# Patient Record
Sex: Female | Born: 1935 | Race: White | Hispanic: No | Smoking: Never smoker
Health system: Southern US, Community
[De-identification: ages and names within clinical notes are randomized; demographics above are authoritative.]

## PROBLEM LIST (undated history)

## (undated) DIAGNOSIS — R011 Cardiac murmur, unspecified: Secondary | ICD-10-CM

## (undated) DIAGNOSIS — I499 Cardiac arrhythmia, unspecified: Secondary | ICD-10-CM

## (undated) DIAGNOSIS — F329 Major depressive disorder, single episode, unspecified: Secondary | ICD-10-CM

## (undated) DIAGNOSIS — Z950 Presence of cardiac pacemaker: Secondary | ICD-10-CM

## (undated) DIAGNOSIS — M1990 Unspecified osteoarthritis, unspecified site: Secondary | ICD-10-CM

## (undated) DIAGNOSIS — D649 Anemia, unspecified: Secondary | ICD-10-CM

## (undated) DIAGNOSIS — Z923 Personal history of irradiation: Secondary | ICD-10-CM

## (undated) DIAGNOSIS — K219 Gastro-esophageal reflux disease without esophagitis: Secondary | ICD-10-CM

## (undated) DIAGNOSIS — C50919 Malignant neoplasm of unspecified site of unspecified female breast: Secondary | ICD-10-CM

## (undated) DIAGNOSIS — C801 Malignant (primary) neoplasm, unspecified: Secondary | ICD-10-CM

## (undated) DIAGNOSIS — F419 Anxiety disorder, unspecified: Secondary | ICD-10-CM

## (undated) DIAGNOSIS — F32A Depression, unspecified: Secondary | ICD-10-CM

## (undated) DIAGNOSIS — I10 Essential (primary) hypertension: Secondary | ICD-10-CM

## (undated) HISTORY — PX: PACEMAKER INSERTION: SHX728

## (undated) HISTORY — PX: BREAST SURGERY: SHX581

## (undated) HISTORY — PX: EYE SURGERY: SHX253

---

## 2005-03-04 ENCOUNTER — Ambulatory Visit: Payer: Self-pay | Admitting: Internal Medicine

## 2006-04-16 ENCOUNTER — Ambulatory Visit: Payer: Self-pay | Admitting: Internal Medicine

## 2007-04-21 ENCOUNTER — Ambulatory Visit: Payer: Self-pay | Admitting: Unknown Physician Specialty

## 2007-09-05 ENCOUNTER — Emergency Department: Payer: Self-pay | Admitting: Emergency Medicine

## 2008-04-21 ENCOUNTER — Ambulatory Visit: Payer: Self-pay | Admitting: Unknown Physician Specialty

## 2008-07-13 ENCOUNTER — Ambulatory Visit: Payer: Self-pay | Admitting: Internal Medicine

## 2008-07-13 ENCOUNTER — Ambulatory Visit: Payer: Self-pay | Admitting: Ophthalmology

## 2008-07-25 ENCOUNTER — Ambulatory Visit: Payer: Self-pay | Admitting: Ophthalmology

## 2009-01-18 ENCOUNTER — Ambulatory Visit: Payer: Self-pay | Admitting: Internal Medicine

## 2009-02-08 ENCOUNTER — Ambulatory Visit: Payer: Self-pay | Admitting: Unknown Physician Specialty

## 2009-05-03 ENCOUNTER — Ambulatory Visit: Payer: Self-pay | Admitting: Internal Medicine

## 2009-05-04 ENCOUNTER — Ambulatory Visit: Payer: Self-pay | Admitting: Oncology

## 2009-06-02 ENCOUNTER — Ambulatory Visit: Payer: Self-pay | Admitting: Oncology

## 2009-06-02 ENCOUNTER — Ambulatory Visit: Payer: Self-pay | Admitting: Internal Medicine

## 2009-07-03 ENCOUNTER — Ambulatory Visit: Payer: Self-pay | Admitting: Internal Medicine

## 2009-07-03 ENCOUNTER — Ambulatory Visit: Payer: Self-pay | Admitting: Oncology

## 2009-07-19 ENCOUNTER — Ambulatory Visit: Payer: Self-pay | Admitting: Internal Medicine

## 2009-08-10 ENCOUNTER — Ambulatory Visit: Payer: Self-pay | Admitting: Rheumatology

## 2009-09-02 DIAGNOSIS — C801 Malignant (primary) neoplasm, unspecified: Secondary | ICD-10-CM

## 2009-09-02 DIAGNOSIS — C50919 Malignant neoplasm of unspecified site of unspecified female breast: Secondary | ICD-10-CM

## 2009-09-02 HISTORY — DX: Malignant (primary) neoplasm, unspecified: C80.1

## 2009-09-02 HISTORY — PX: BREAST BIOPSY: SHX20

## 2009-09-02 HISTORY — DX: Malignant neoplasm of unspecified site of unspecified female breast: C50.919

## 2009-09-05 ENCOUNTER — Ambulatory Visit: Payer: Self-pay | Admitting: Pain Medicine

## 2010-01-17 ENCOUNTER — Ambulatory Visit: Payer: Self-pay | Admitting: Internal Medicine

## 2010-02-27 ENCOUNTER — Ambulatory Visit: Payer: Self-pay | Admitting: Surgery

## 2010-03-02 ENCOUNTER — Ambulatory Visit: Payer: Self-pay | Admitting: Oncology

## 2010-03-06 ENCOUNTER — Ambulatory Visit: Payer: Self-pay | Admitting: Surgery

## 2010-03-19 ENCOUNTER — Ambulatory Visit: Payer: Self-pay | Admitting: Oncology

## 2010-03-21 ENCOUNTER — Ambulatory Visit: Payer: Self-pay

## 2010-04-02 ENCOUNTER — Ambulatory Visit: Payer: Self-pay | Admitting: Oncology

## 2010-05-03 ENCOUNTER — Ambulatory Visit: Payer: Self-pay | Admitting: Oncology

## 2010-05-29 ENCOUNTER — Ambulatory Visit: Payer: Self-pay

## 2010-06-02 ENCOUNTER — Ambulatory Visit: Payer: Self-pay | Admitting: Oncology

## 2010-06-05 ENCOUNTER — Ambulatory Visit: Payer: Self-pay | Admitting: Oncology

## 2010-06-11 ENCOUNTER — Emergency Department: Payer: Self-pay | Admitting: Emergency Medicine

## 2010-07-03 ENCOUNTER — Ambulatory Visit: Payer: Self-pay | Admitting: Oncology

## 2010-07-23 ENCOUNTER — Ambulatory Visit: Payer: Self-pay | Admitting: Internal Medicine

## 2010-08-10 ENCOUNTER — Ambulatory Visit: Payer: Self-pay | Admitting: Oncology

## 2010-09-02 ENCOUNTER — Ambulatory Visit: Payer: Self-pay | Admitting: Oncology

## 2010-09-25 ENCOUNTER — Ambulatory Visit: Payer: Self-pay | Admitting: Physician Assistant

## 2010-09-30 ENCOUNTER — Emergency Department: Payer: Self-pay | Admitting: Emergency Medicine

## 2010-10-03 ENCOUNTER — Ambulatory Visit: Payer: Self-pay | Admitting: Oncology

## 2010-10-19 ENCOUNTER — Ambulatory Visit: Payer: Self-pay | Admitting: Unknown Physician Specialty

## 2010-11-01 ENCOUNTER — Ambulatory Visit: Payer: Self-pay | Admitting: Oncology

## 2010-12-26 ENCOUNTER — Ambulatory Visit: Payer: Self-pay | Admitting: Unknown Physician Specialty

## 2011-01-09 ENCOUNTER — Ambulatory Visit: Payer: Self-pay | Admitting: Oncology

## 2011-02-01 ENCOUNTER — Ambulatory Visit: Payer: Self-pay | Admitting: Oncology

## 2011-03-03 ENCOUNTER — Ambulatory Visit: Payer: Self-pay | Admitting: Oncology

## 2011-04-03 ENCOUNTER — Ambulatory Visit: Payer: Self-pay | Admitting: Oncology

## 2011-05-04 ENCOUNTER — Ambulatory Visit: Payer: Self-pay | Admitting: Oncology

## 2011-05-15 ENCOUNTER — Ambulatory Visit: Payer: Self-pay | Admitting: Obstetrics and Gynecology

## 2011-06-03 ENCOUNTER — Ambulatory Visit: Payer: Self-pay | Admitting: Oncology

## 2011-06-06 ENCOUNTER — Ambulatory Visit: Payer: Self-pay | Admitting: Obstetrics and Gynecology

## 2011-06-06 DIAGNOSIS — Z950 Presence of cardiac pacemaker: Secondary | ICD-10-CM

## 2011-06-10 ENCOUNTER — Ambulatory Visit: Payer: Self-pay | Admitting: Obstetrics and Gynecology

## 2011-07-04 ENCOUNTER — Ambulatory Visit: Payer: Self-pay | Admitting: Oncology

## 2011-07-12 ENCOUNTER — Ambulatory Visit: Payer: Self-pay | Admitting: Rheumatology

## 2011-07-30 ENCOUNTER — Ambulatory Visit: Payer: Self-pay | Admitting: Oncology

## 2011-08-29 DIAGNOSIS — I10 Essential (primary) hypertension: Secondary | ICD-10-CM | POA: Insufficient documentation

## 2011-08-29 DIAGNOSIS — Z950 Presence of cardiac pacemaker: Secondary | ICD-10-CM | POA: Insufficient documentation

## 2011-08-29 DIAGNOSIS — F329 Major depressive disorder, single episode, unspecified: Secondary | ICD-10-CM | POA: Insufficient documentation

## 2011-08-29 DIAGNOSIS — I4891 Unspecified atrial fibrillation: Secondary | ICD-10-CM | POA: Insufficient documentation

## 2011-08-29 DIAGNOSIS — G2581 Restless legs syndrome: Secondary | ICD-10-CM | POA: Insufficient documentation

## 2011-08-29 DIAGNOSIS — I442 Atrioventricular block, complete: Secondary | ICD-10-CM | POA: Insufficient documentation

## 2011-08-29 DIAGNOSIS — C50919 Malignant neoplasm of unspecified site of unspecified female breast: Secondary | ICD-10-CM | POA: Insufficient documentation

## 2011-08-29 DIAGNOSIS — E785 Hyperlipidemia, unspecified: Secondary | ICD-10-CM | POA: Insufficient documentation

## 2011-09-04 DIAGNOSIS — I472 Ventricular tachycardia: Secondary | ICD-10-CM | POA: Insufficient documentation

## 2011-09-25 ENCOUNTER — Ambulatory Visit: Payer: Self-pay | Admitting: Oncology

## 2011-09-25 LAB — CBC CANCER CENTER
Basophil #: 0 x10 3/mm (ref 0.0–0.1)
Basophil %: 0.1 %
Eosinophil #: 0 x10 3/mm (ref 0.0–0.7)
Eosinophil %: 0.7 %
HCT: 34.9 % — ABNORMAL LOW (ref 35.0–47.0)
Lymphocyte #: 1.3 x10 3/mm (ref 1.0–3.6)
MCH: 32 pg (ref 26.0–34.0)
MCHC: 33.6 g/dL (ref 32.0–36.0)
MCV: 95 fL (ref 80–100)
Monocyte %: 8 %
Neutrophil %: 64.1 %
Platelet: 225 x10 3/mm (ref 150–440)
RBC: 3.67 10*6/uL — ABNORMAL LOW (ref 3.80–5.20)
RDW: 15.7 % — ABNORMAL HIGH (ref 11.5–14.5)

## 2011-09-25 LAB — COMPREHENSIVE METABOLIC PANEL
Albumin: 4.2 g/dL (ref 3.4–5.0)
Alkaline Phosphatase: 75 U/L (ref 50–136)
Bilirubin,Total: 0.6 mg/dL (ref 0.2–1.0)
Creatinine: 1.6 mg/dL — ABNORMAL HIGH (ref 0.60–1.30)
EGFR (African American): 40 — ABNORMAL LOW
Glucose: 121 mg/dL — ABNORMAL HIGH (ref 65–99)
SGOT(AST): 27 U/L (ref 15–37)
Sodium: 142 mmol/L (ref 136–145)
Total Protein: 7.9 g/dL (ref 6.4–8.2)

## 2011-09-25 LAB — FOLATE: Folic Acid: 71.3 ng/mL (ref 3.1–100.0)

## 2011-10-04 ENCOUNTER — Ambulatory Visit: Payer: Self-pay | Admitting: Oncology

## 2011-11-11 ENCOUNTER — Ambulatory Visit: Payer: Self-pay | Admitting: Oncology

## 2011-12-02 ENCOUNTER — Ambulatory Visit: Payer: Self-pay | Admitting: Oncology

## 2011-12-02 ENCOUNTER — Ambulatory Visit: Payer: Self-pay | Admitting: Unknown Physician Specialty

## 2011-12-04 LAB — PATHOLOGY REPORT

## 2012-01-09 ENCOUNTER — Ambulatory Visit: Payer: Self-pay | Admitting: Oncology

## 2012-02-01 ENCOUNTER — Ambulatory Visit: Payer: Self-pay | Admitting: Oncology

## 2012-02-03 LAB — CBC CANCER CENTER
Basophil #: 0 x10 3/mm (ref 0.0–0.1)
Basophil %: 0.5 %
HCT: 31.2 % — ABNORMAL LOW (ref 35.0–47.0)
Lymphocyte %: 36.9 %
MCH: 32.4 pg (ref 26.0–34.0)
MCV: 99 fL (ref 80–100)
Monocyte #: 0.4 x10 3/mm (ref 0.2–0.9)
Monocyte %: 10.1 %
Neutrophil #: 2 x10 3/mm (ref 1.4–6.5)
Platelet: 183 x10 3/mm (ref 150–440)
RBC: 3.16 10*6/uL — ABNORMAL LOW (ref 3.80–5.20)
RDW: 14.6 % — ABNORMAL HIGH (ref 11.5–14.5)

## 2012-02-03 LAB — COMPREHENSIVE METABOLIC PANEL
Alkaline Phosphatase: 52 U/L (ref 50–136)
Anion Gap: 7 (ref 7–16)
Calcium, Total: 8.8 mg/dL (ref 8.5–10.1)
Co2: 30 mmol/L (ref 21–32)
Creatinine: 1.54 mg/dL — ABNORMAL HIGH (ref 0.60–1.30)
Glucose: 92 mg/dL (ref 65–99)
Osmolality: 282 (ref 275–301)
SGPT (ALT): 19 U/L
Sodium: 138 mmol/L (ref 136–145)

## 2012-03-02 ENCOUNTER — Ambulatory Visit: Payer: Self-pay | Admitting: Oncology

## 2012-08-03 ENCOUNTER — Ambulatory Visit: Payer: Self-pay | Admitting: Internal Medicine

## 2012-11-07 ENCOUNTER — Emergency Department: Payer: Self-pay | Admitting: Emergency Medicine

## 2012-11-07 LAB — CBC
HCT: 34.2 % — ABNORMAL LOW (ref 35.0–47.0)
MCH: 32.5 pg (ref 26.0–34.0)
MCHC: 33.2 g/dL (ref 32.0–36.0)
MCV: 98 fL (ref 80–100)
Platelet: 205 10*3/uL (ref 150–440)

## 2012-11-07 LAB — BASIC METABOLIC PANEL
Anion Gap: 6 — ABNORMAL LOW (ref 7–16)
BUN: 27 mg/dL — ABNORMAL HIGH (ref 7–18)
Calcium, Total: 9.2 mg/dL (ref 8.5–10.1)
Chloride: 104 mmol/L (ref 98–107)
Co2: 31 mmol/L (ref 21–32)
Creatinine: 1.39 mg/dL — ABNORMAL HIGH (ref 0.60–1.30)
EGFR (African American): 43 — ABNORMAL LOW
EGFR (Non-African Amer.): 37 — ABNORMAL LOW
Potassium: 3.8 mmol/L (ref 3.5–5.1)

## 2012-11-07 LAB — TROPONIN I: Troponin-I: <0.02 ng/mL

## 2012-12-01 ENCOUNTER — Ambulatory Visit: Payer: Self-pay | Admitting: Oncology

## 2012-12-31 ENCOUNTER — Ambulatory Visit: Payer: Self-pay | Admitting: Oncology

## 2013-01-26 ENCOUNTER — Ambulatory Visit: Payer: Self-pay | Admitting: Orthopedic Surgery

## 2013-01-26 LAB — HEMOGLOBIN: HGB: 10.4 g/dL — ABNORMAL LOW (ref 12.0–16.0)

## 2013-02-01 ENCOUNTER — Ambulatory Visit: Payer: Self-pay | Admitting: Oncology

## 2013-02-01 LAB — CBC CANCER CENTER
Basophil #: 0 x10 3/mm (ref 0.0–0.1)
Eosinophil #: 0 x10 3/mm (ref 0.0–0.7)
Eosinophil %: 0.7 %
HCT: 32.3 % — ABNORMAL LOW (ref 35.0–47.0)
HGB: 10.9 g/dL — ABNORMAL LOW (ref 12.0–16.0)
Lymphocyte %: 37.5 %
MCH: 32.4 pg (ref 26.0–34.0)
MCHC: 33.7 g/dL (ref 32.0–36.0)
MCV: 96 fL (ref 80–100)
Monocyte #: 0.4 x10 3/mm (ref 0.2–0.9)
Monocyte %: 8.8 %
Platelet: 173 x10 3/mm (ref 150–440)
RBC: 3.36 10*6/uL — ABNORMAL LOW (ref 3.80–5.20)
RDW: 14.1 % (ref 11.5–14.5)

## 2013-02-01 LAB — COMPREHENSIVE METABOLIC PANEL
Albumin: 4 g/dL (ref 3.4–5.0)
Anion Gap: 6 — ABNORMAL LOW (ref 7–16)
BUN: 33 mg/dL — ABNORMAL HIGH (ref 7–18)
Bilirubin,Total: 0.6 mg/dL (ref 0.2–1.0)
Co2: 32 mmol/L (ref 21–32)
Creatinine: 1.68 mg/dL — ABNORMAL HIGH (ref 0.60–1.30)
Glucose: 123 mg/dL — ABNORMAL HIGH (ref 65–99)
Osmolality: 288 (ref 275–301)
Potassium: 3.7 mmol/L (ref 3.5–5.1)
SGOT(AST): 22 U/L (ref 15–37)
SGPT (ALT): 18 U/L (ref 12–78)
Sodium: 140 mmol/L (ref 136–145)

## 2013-02-02 ENCOUNTER — Ambulatory Visit: Payer: Self-pay | Admitting: Orthopedic Surgery

## 2013-02-02 LAB — PROTIME-INR
INR: 1.1
Prothrombin Time: 14.1 secs (ref 11.5–14.7)

## 2013-03-02 ENCOUNTER — Ambulatory Visit: Payer: Self-pay | Admitting: Oncology

## 2013-08-04 ENCOUNTER — Ambulatory Visit: Payer: Self-pay | Admitting: Oncology

## 2013-08-09 ENCOUNTER — Ambulatory Visit: Payer: Self-pay | Admitting: Oncology

## 2013-08-09 LAB — CBC CANCER CENTER
Basophil #: 0 x10 3/mm (ref 0.0–0.1)
Basophil %: 0.5 %
Eosinophil #: 0 x10 3/mm (ref 0.0–0.7)
HGB: 11.4 g/dL — ABNORMAL LOW (ref 12.0–16.0)
Lymphocyte #: 2 x10 3/mm (ref 1.0–3.6)
Lymphocyte %: 43.2 %
MCH: 32.2 pg (ref 26.0–34.0)
MCV: 98 fL (ref 80–100)
Monocyte #: 0.4 x10 3/mm (ref 0.2–0.9)
Monocyte %: 8.1 %
Neutrophil #: 2.1 x10 3/mm (ref 1.4–6.5)
RBC: 3.55 10*6/uL — ABNORMAL LOW (ref 3.80–5.20)
RDW: 14.1 % (ref 11.5–14.5)
WBC: 4.5 x10 3/mm (ref 3.6–11.0)

## 2013-08-09 LAB — COMPREHENSIVE METABOLIC PANEL
Albumin: 4.1 g/dL (ref 3.4–5.0)
Alkaline Phosphatase: 70 U/L
Anion Gap: 7 (ref 7–16)
Bilirubin,Total: 0.2 mg/dL (ref 0.2–1.0)
Chloride: 102 mmol/L (ref 98–107)
Co2: 32 mmol/L (ref 21–32)
EGFR (African American): 38 — ABNORMAL LOW
Glucose: 91 mg/dL (ref 65–99)
Potassium: 3.6 mmol/L (ref 3.5–5.1)
SGPT (ALT): 23 U/L (ref 12–78)
Sodium: 141 mmol/L (ref 136–145)

## 2013-08-09 LAB — IRON AND TIBC
Iron Saturation: 27 %
Iron: 75 ug/dL (ref 50–170)

## 2013-09-02 ENCOUNTER — Ambulatory Visit: Payer: Self-pay | Admitting: Oncology

## 2013-10-03 ENCOUNTER — Ambulatory Visit: Payer: Self-pay | Admitting: Oncology

## 2013-10-24 ENCOUNTER — Emergency Department: Payer: Self-pay | Admitting: Emergency Medicine

## 2013-10-24 LAB — LIPASE, BLOOD: Lipase: 151 U/L (ref 73–393)

## 2013-10-24 LAB — COMPREHENSIVE METABOLIC PANEL
ANION GAP: 8 (ref 7–16)
AST: 32 U/L (ref 15–37)
Albumin: 4.4 g/dL (ref 3.4–5.0)
Alkaline Phosphatase: 61 U/L
BUN: 26 mg/dL — ABNORMAL HIGH (ref 7–18)
Bilirubin,Total: 0.7 mg/dL (ref 0.2–1.0)
CHLORIDE: 103 mmol/L (ref 98–107)
Calcium, Total: 9.6 mg/dL (ref 8.5–10.1)
Co2: 27 mmol/L (ref 21–32)
Creatinine: 1.75 mg/dL — ABNORMAL HIGH (ref 0.60–1.30)
EGFR (African American): 32 — ABNORMAL LOW
EGFR (Non-African Amer.): 28 — ABNORMAL LOW
GLUCOSE: 94 mg/dL (ref 65–99)
Osmolality: 280 (ref 275–301)
Potassium: 4 mmol/L (ref 3.5–5.1)
SGPT (ALT): 18 U/L (ref 12–78)
Sodium: 138 mmol/L (ref 136–145)
Total Protein: 7.9 g/dL (ref 6.4–8.2)

## 2013-10-24 LAB — URINALYSIS, COMPLETE
BLOOD: NEGATIVE
Bacteria: NONE SEEN
Bilirubin,UR: NEGATIVE
Glucose,UR: NEGATIVE mg/dL (ref 0–75)
Nitrite: NEGATIVE
PH: 7 (ref 4.5–8.0)
PROTEIN: NEGATIVE
RBC,UR: 1 /HPF (ref 0–5)
Specific Gravity: 1.016 (ref 1.003–1.030)
Squamous Epithelial: 1

## 2013-10-24 LAB — CBC WITH DIFFERENTIAL/PLATELET
BASOS ABS: 0.1 10*3/uL (ref 0.0–0.1)
BASOS PCT: 1.4 %
EOS ABS: 0 10*3/uL (ref 0.0–0.7)
EOS PCT: 0.6 %
HCT: 35.9 % (ref 35.0–47.0)
HGB: 12.2 g/dL (ref 12.0–16.0)
LYMPHS PCT: 32.7 %
Lymphocyte #: 1.5 10*3/uL (ref 1.0–3.6)
MCH: 33.2 pg (ref 26.0–34.0)
MCHC: 34.1 g/dL (ref 32.0–36.0)
MCV: 97 fL (ref 80–100)
Monocyte #: 0.3 x10 3/mm (ref 0.2–0.9)
Monocyte %: 7.1 %
NEUTROS ABS: 2.6 10*3/uL (ref 1.4–6.5)
Neutrophil %: 58.2 %
Platelet: 207 10*3/uL (ref 150–440)
RBC: 3.68 10*6/uL — ABNORMAL LOW (ref 3.80–5.20)
RDW: 14 % (ref 11.5–14.5)
WBC: 4.5 10*3/uL (ref 3.6–11.0)

## 2013-12-06 ENCOUNTER — Emergency Department: Payer: Self-pay | Admitting: Emergency Medicine

## 2013-12-06 LAB — CBC WITH DIFFERENTIAL/PLATELET
BASOS ABS: 0 10*3/uL (ref 0.0–0.1)
BASOS PCT: 0.4 %
EOS ABS: 0 10*3/uL (ref 0.0–0.7)
Eosinophil %: 0.7 %
HCT: 32.2 % — AB (ref 35.0–47.0)
HGB: 10.7 g/dL — ABNORMAL LOW (ref 12.0–16.0)
LYMPHS ABS: 2 10*3/uL (ref 1.0–3.6)
LYMPHS PCT: 33.9 %
MCH: 32.9 pg (ref 26.0–34.0)
MCHC: 33.4 g/dL (ref 32.0–36.0)
MCV: 98 fL (ref 80–100)
Monocyte #: 0.6 x10 3/mm (ref 0.2–0.9)
Monocyte %: 10.2 %
NEUTROS PCT: 54.8 %
Neutrophil #: 3.2 10*3/uL (ref 1.4–6.5)
Platelet: 180 10*3/uL (ref 150–440)
RBC: 3.27 10*6/uL — ABNORMAL LOW (ref 3.80–5.20)
RDW: 14.7 % — ABNORMAL HIGH (ref 11.5–14.5)
WBC: 5.9 10*3/uL (ref 3.6–11.0)

## 2013-12-06 LAB — COMPREHENSIVE METABOLIC PANEL
Albumin: 3.6 g/dL (ref 3.4–5.0)
Alkaline Phosphatase: 66 U/L
Anion Gap: 4 — ABNORMAL LOW (ref 7–16)
BUN: 20 mg/dL — AB (ref 7–18)
Bilirubin,Total: 0.3 mg/dL (ref 0.2–1.0)
Calcium, Total: 8.6 mg/dL (ref 8.5–10.1)
Chloride: 107 mmol/L (ref 98–107)
Co2: 27 mmol/L (ref 21–32)
Creatinine: 1.07 mg/dL (ref 0.60–1.30)
EGFR (African American): 58 — ABNORMAL LOW
EGFR (Non-African Amer.): 50 — ABNORMAL LOW
GLUCOSE: 103 mg/dL — AB (ref 65–99)
OSMOLALITY: 279 (ref 275–301)
POTASSIUM: 3.4 mmol/L — AB (ref 3.5–5.1)
SGOT(AST): 29 U/L (ref 15–37)
SGPT (ALT): 21 U/L (ref 12–78)
Sodium: 138 mmol/L (ref 136–145)
TOTAL PROTEIN: 7 g/dL (ref 6.4–8.2)

## 2013-12-06 LAB — URINALYSIS, COMPLETE
BILIRUBIN, UR: NEGATIVE
BLOOD: NEGATIVE
Bacteria: NONE SEEN
Glucose,UR: NEGATIVE mg/dL (ref 0–75)
Ketone: NEGATIVE
Leukocyte Esterase: NEGATIVE
Nitrite: NEGATIVE
PH: 6 (ref 4.5–8.0)
Protein: NEGATIVE
RBC, UR: NONE SEEN /HPF (ref 0–5)
Specific Gravity: 1.006 (ref 1.003–1.030)
Squamous Epithelial: NONE SEEN
WBC UR: NONE SEEN /HPF (ref 0–5)

## 2013-12-06 LAB — LIPASE, BLOOD: Lipase: 143 U/L (ref 73–393)

## 2013-12-06 LAB — TROPONIN I

## 2013-12-31 DIAGNOSIS — Z7901 Long term (current) use of anticoagulants: Secondary | ICD-10-CM | POA: Insufficient documentation

## 2014-01-04 ENCOUNTER — Ambulatory Visit: Payer: Self-pay | Admitting: Oncology

## 2014-01-04 LAB — URINALYSIS, COMPLETE
BACTERIA: NONE SEEN
BILIRUBIN, UR: NEGATIVE
BLOOD: NEGATIVE
GLUCOSE, UR: NEGATIVE mg/dL (ref 0–75)
Leukocyte Esterase: NEGATIVE
Nitrite: NEGATIVE
PH: 5 (ref 4.5–8.0)
Protein: NEGATIVE
RBC, UR: NONE SEEN /HPF (ref 0–5)
SPECIFIC GRAVITY: 1.024 (ref 1.003–1.030)
WBC UR: 1 /HPF (ref 0–5)

## 2014-01-05 LAB — CANCER ANTIGEN 27.29: CA 27.29: 22.9 U/mL (ref 0.0–38.6)

## 2014-01-06 LAB — URINE CULTURE

## 2014-01-14 DIAGNOSIS — D649 Anemia, unspecified: Secondary | ICD-10-CM | POA: Insufficient documentation

## 2014-01-31 ENCOUNTER — Ambulatory Visit: Payer: Self-pay | Admitting: Oncology

## 2014-02-03 ENCOUNTER — Ambulatory Visit: Payer: Self-pay | Admitting: Internal Medicine

## 2014-02-08 LAB — CBC CANCER CENTER
BASOS ABS: 0 x10 3/mm (ref 0.0–0.1)
BASOS PCT: 0.5 %
EOS ABS: 0.1 x10 3/mm (ref 0.0–0.7)
Eosinophil %: 1.2 %
HCT: 32.1 % — AB (ref 35.0–47.0)
HGB: 10.4 g/dL — AB (ref 12.0–16.0)
Lymphocyte #: 1.4 x10 3/mm (ref 1.0–3.6)
Lymphocyte %: 33.5 %
MCH: 31.8 pg (ref 26.0–34.0)
MCHC: 32.4 g/dL (ref 32.0–36.0)
MCV: 98 fL (ref 80–100)
MONO ABS: 0.4 x10 3/mm (ref 0.2–0.9)
Monocyte %: 9.1 %
NEUTROS PCT: 55.7 %
Neutrophil #: 2.4 x10 3/mm (ref 1.4–6.5)
Platelet: 163 x10 3/mm (ref 150–440)
RBC: 3.27 10*6/uL — ABNORMAL LOW (ref 3.80–5.20)
RDW: 13.8 % (ref 11.5–14.5)
WBC: 4.3 x10 3/mm (ref 3.6–11.0)

## 2014-02-08 LAB — COMPREHENSIVE METABOLIC PANEL
ALBUMIN: 3.6 g/dL (ref 3.4–5.0)
Alkaline Phosphatase: 74 U/L
Anion Gap: 5 — ABNORMAL LOW (ref 7–16)
BUN: 22 mg/dL — ABNORMAL HIGH (ref 7–18)
Bilirubin,Total: 0.4 mg/dL (ref 0.2–1.0)
CALCIUM: 9.4 mg/dL (ref 8.5–10.1)
CHLORIDE: 105 mmol/L (ref 98–107)
CO2: 33 mmol/L — AB (ref 21–32)
Creatinine: 1.59 mg/dL — ABNORMAL HIGH (ref 0.60–1.30)
EGFR (African American): 36 — ABNORMAL LOW
EGFR (Non-African Amer.): 31 — ABNORMAL LOW
Glucose: 105 mg/dL — ABNORMAL HIGH (ref 65–99)
OSMOLALITY: 289 (ref 275–301)
Potassium: 3.9 mmol/L (ref 3.5–5.1)
SGOT(AST): 32 U/L (ref 15–37)
SGPT (ALT): 32 U/L (ref 12–78)
SODIUM: 143 mmol/L (ref 136–145)
TOTAL PROTEIN: 6.8 g/dL (ref 6.4–8.2)

## 2014-02-08 LAB — IRON AND TIBC
IRON BIND. CAP.(TOTAL): 263 ug/dL (ref 250–450)
IRON SATURATION: 33 %
IRON: 87 ug/dL (ref 50–170)
UNBOUND IRON-BIND. CAP.: 176 ug/dL

## 2014-02-09 LAB — CANCER ANTIGEN 27.29: CA 27.29: 18.8 U/mL (ref 0.0–38.6)

## 2014-03-02 ENCOUNTER — Ambulatory Visit: Payer: Self-pay | Admitting: Oncology

## 2014-08-09 ENCOUNTER — Ambulatory Visit: Payer: Self-pay | Admitting: Internal Medicine

## 2014-08-16 ENCOUNTER — Ambulatory Visit: Payer: Self-pay | Admitting: Oncology

## 2014-08-16 LAB — COMPREHENSIVE METABOLIC PANEL
ALBUMIN: 4 g/dL (ref 3.4–5.0)
ALK PHOS: 66 U/L
AST: 29 U/L (ref 15–37)
Anion Gap: 4 — ABNORMAL LOW (ref 7–16)
BILIRUBIN TOTAL: 0.4 mg/dL (ref 0.2–1.0)
BUN: 17 mg/dL (ref 7–18)
CALCIUM: 9.1 mg/dL (ref 8.5–10.1)
CREATININE: 1.09 mg/dL (ref 0.60–1.30)
Chloride: 104 mmol/L (ref 98–107)
Co2: 34 mmol/L — ABNORMAL HIGH (ref 21–32)
EGFR (Non-African Amer.): 52 — ABNORMAL LOW
Glucose: 65 mg/dL (ref 65–99)
Osmolality: 283 (ref 275–301)
Potassium: 4.3 mmol/L (ref 3.5–5.1)
SGPT (ALT): 19 U/L
Sodium: 142 mmol/L (ref 136–145)
TOTAL PROTEIN: 7.1 g/dL (ref 6.4–8.2)

## 2014-08-16 LAB — CBC CANCER CENTER
BASOS ABS: 0 x10 3/mm (ref 0.0–0.1)
Basophil %: 0.6 %
EOS ABS: 0.1 x10 3/mm (ref 0.0–0.7)
EOS PCT: 1.3 %
HCT: 35 % (ref 35.0–47.0)
HGB: 11.6 g/dL — AB (ref 12.0–16.0)
LYMPHS ABS: 1.5 x10 3/mm (ref 1.0–3.6)
Lymphocyte %: 36.1 %
MCH: 32.9 pg (ref 26.0–34.0)
MCHC: 33.1 g/dL (ref 32.0–36.0)
MCV: 99 fL (ref 80–100)
Monocyte #: 0.4 x10 3/mm (ref 0.2–0.9)
Monocyte %: 9.6 %
NEUTROS ABS: 2.2 x10 3/mm (ref 1.4–6.5)
NEUTROS PCT: 52.4 %
PLATELETS: 183 x10 3/mm (ref 150–440)
RBC: 3.53 10*6/uL — AB (ref 3.80–5.20)
RDW: 14.1 % (ref 11.5–14.5)
WBC: 4.2 x10 3/mm (ref 3.6–11.0)

## 2014-09-02 ENCOUNTER — Ambulatory Visit: Payer: Self-pay | Admitting: Oncology

## 2014-10-12 ENCOUNTER — Ambulatory Visit: Payer: Self-pay | Admitting: Oncology

## 2014-10-24 DIAGNOSIS — M1611 Unilateral primary osteoarthritis, right hip: Secondary | ICD-10-CM | POA: Insufficient documentation

## 2014-11-01 ENCOUNTER — Ambulatory Visit
Admit: 2014-11-01 | Disposition: A | Discharge status: Home / Self Care | Payer: Self-pay | Attending: Oncology | Admitting: Oncology

## 2014-11-11 DIAGNOSIS — M5416 Radiculopathy, lumbar region: Secondary | ICD-10-CM | POA: Insufficient documentation

## 2014-11-16 DIAGNOSIS — I498 Other specified cardiac arrhythmias: Secondary | ICD-10-CM | POA: Insufficient documentation

## 2014-11-16 DIAGNOSIS — Z950 Presence of cardiac pacemaker: Secondary | ICD-10-CM

## 2014-11-21 ENCOUNTER — Ambulatory Visit: Payer: Self-pay | Admitting: Physical Medicine and Rehabilitation

## 2014-12-16 ENCOUNTER — Other Ambulatory Visit: Payer: Self-pay | Admitting: Oncology

## 2014-12-16 DIAGNOSIS — R928 Other abnormal and inconclusive findings on diagnostic imaging of breast: Secondary | ICD-10-CM

## 2014-12-23 NOTE — Op Note (Signed)
PATIENT NAME:  Sally Carroll, Sally Carroll MR#:  [id number] DATE OF BIRTH:  05/20/1934  DATE OF PROCEDURE:  02/02/2013  PREOPERATIVE DIAGNOSIS: Right thumb mucous cyst.   POSTOPERATIVE DIAGNOSIS: Right thumb mucous cyst.   PROCEDURE: Right thumb mucous cyst excision.   ANESTHESIA: General.   SURGEON: Laurene Footman, MD   DESCRIPTION OF PROCEDURE: The patient was brought to the Operating Room, and after adequate anesthesia was obtained the right arm was prepped and draped in the usual sterile fashion with a tourniquet applied to the upper arm. After patient identification and timeout procedures were completed, the tourniquet was raised to 250 mmHg. This was placed at the mid forearm secondary to prior surgery on the upper extremity and breast surgery. The cyst was on the radial aspect of the thumb dorsally and was giving indentation to the nail. This was elliptically excised, care being taken not to damage the germinal matrix. Oblique incision was made then across the IP joint in a dorsal ulnar direction.  There was further cystic fluid present that communicated with the small mucous cyst and was subcutaneous. This fluid was evacuated. The extensor tendon was then elevated and spurs removed from the IP joint with the use of a small osteotome and a rongeur, followed by a rasp to try to prevent recurrence. The wound was then irrigated thoroughly and the wound closed with simple interrupted 4-0 nylon skin sutures. The area where the skin was excised along with the mucous cyst could be closed primarily. A digital block was given with 10 mL of 0.5% Sensorcaine to aid in postop analgesia. The tourniquet was let down after application of a sterile dressing of Xeroform, 4 x 4's and a Kling roll.   ESTIMATED BLOOD LOSS: Minimal.   COMPLICATIONS: None.   SPECIMENS: Mucous cyst and adjacent skin.    TOURNIQUET TIME:  17 minutes at 250 mmHg.    ____________________________ Laurene Footman,  MD mjm:cb D: 02/02/2013 19:24:11 ET T: 02/02/2013 20:05:03 ET JOB#: [id number]  cc: Laurene Footman, MD, <Dictator> Laurene Footman MD ELECTRONICALLY SIGNED 02/03/2013 12:51

## 2014-12-25 ENCOUNTER — Emergency Department
Admit: 2014-12-25 | Disposition: A | Discharge status: Home / Self Care | Payer: Self-pay | Admitting: Emergency Medicine

## 2014-12-25 LAB — COMPREHENSIVE METABOLIC PANEL
AST: 40 U/L
Albumin: 4.6 g/dL
Alkaline Phosphatase: 42 U/L
Anion Gap: 11 (ref 7–16)
BUN: 20 mg/dL
Bilirubin,Total: 0.4 mg/dL
CO2: 25 mmol/L
Calcium, Total: 9.5 mg/dL
Chloride: 103 mmol/L
Creatinine: 1.07 mg/dL — ABNORMAL HIGH
EGFR (African American): 58 — ABNORMAL LOW
GFR CALC NON AF AMER: 50 — AB
Glucose: 112 mg/dL — ABNORMAL HIGH
Potassium: 3.8 mmol/L
SGPT (ALT): 29 U/L
Sodium: 139 mmol/L
Total Protein: 7.1 g/dL

## 2014-12-25 LAB — CBC
HCT: 34.4 % — ABNORMAL LOW (ref 35.0–47.0)
HGB: 11.9 g/dL — AB (ref 12.0–16.0)
MCH: 34.4 pg — AB (ref 26.0–34.0)
MCHC: 34.5 g/dL (ref 32.0–36.0)
MCV: 100 fL (ref 80–100)
Platelet: 221 10*3/uL (ref 150–440)
RBC: 3.45 10*6/uL — AB (ref 3.80–5.20)
RDW: 14.9 % — ABNORMAL HIGH (ref 11.5–14.5)
WBC: 5.7 10*3/uL (ref 3.6–11.0)

## 2014-12-25 LAB — TROPONIN I

## 2014-12-25 LAB — PROTIME-INR
INR: 2.9
Prothrombin Time: 30.3 secs — ABNORMAL HIGH

## 2015-02-01 ENCOUNTER — Other Ambulatory Visit: Payer: Self-pay | Admitting: Neurosurgery

## 2015-02-08 ENCOUNTER — Encounter (HOSPITAL_COMMUNITY): Payer: Self-pay

## 2015-02-08 ENCOUNTER — Encounter (HOSPITAL_COMMUNITY)
Admission: RE | Admit: 2015-02-08 | Discharge: 2015-02-08 | Disposition: A | Discharge status: Home / Self Care | Payer: Medicare PPO | Source: Ambulatory Visit | Attending: Neurosurgery | Admitting: Neurosurgery

## 2015-02-08 DIAGNOSIS — Z0183 Encounter for blood typing: Secondary | ICD-10-CM

## 2015-02-08 DIAGNOSIS — Z01812 Encounter for preprocedural laboratory examination: Secondary | ICD-10-CM

## 2015-02-08 DIAGNOSIS — I10 Essential (primary) hypertension: Secondary | ICD-10-CM | POA: Insufficient documentation

## 2015-02-08 DIAGNOSIS — Z950 Presence of cardiac pacemaker: Secondary | ICD-10-CM

## 2015-02-08 DIAGNOSIS — I495 Sick sinus syndrome: Secondary | ICD-10-CM

## 2015-02-08 DIAGNOSIS — K219 Gastro-esophageal reflux disease without esophagitis: Secondary | ICD-10-CM

## 2015-02-08 DIAGNOSIS — Z01818 Encounter for other preprocedural examination: Secondary | ICD-10-CM | POA: Insufficient documentation

## 2015-02-08 DIAGNOSIS — I4891 Unspecified atrial fibrillation: Secondary | ICD-10-CM | POA: Insufficient documentation

## 2015-02-08 DIAGNOSIS — M5126 Other intervertebral disc displacement, lumbar region: Secondary | ICD-10-CM | POA: Insufficient documentation

## 2015-02-08 HISTORY — DX: Depression, unspecified: F32.A

## 2015-02-08 HISTORY — DX: Anxiety disorder, unspecified: F41.9

## 2015-02-08 HISTORY — DX: Anemia, unspecified: D64.9

## 2015-02-08 HISTORY — DX: Presence of cardiac pacemaker: Z95.0

## 2015-02-08 HISTORY — DX: Cardiac murmur, unspecified: R01.1

## 2015-02-08 HISTORY — DX: Cardiac arrhythmia, unspecified: I49.9

## 2015-02-08 HISTORY — DX: Essential (primary) hypertension: I10

## 2015-02-08 HISTORY — DX: Gastro-esophageal reflux disease without esophagitis: K21.9

## 2015-02-08 HISTORY — DX: Malignant (primary) neoplasm, unspecified: C80.1

## 2015-02-08 HISTORY — DX: Unspecified osteoarthritis, unspecified site: M19.90

## 2015-02-08 HISTORY — DX: Major depressive disorder, single episode, unspecified: F32.9

## 2015-02-08 LAB — TYPE AND SCREEN
ABO/RH(D): A POS
Antibody Screen: NEGATIVE

## 2015-02-08 LAB — BASIC METABOLIC PANEL
ANION GAP: 7 (ref 5–15)
BUN: 15 mg/dL (ref 6–20)
CHLORIDE: 103 mmol/L (ref 101–111)
CO2: 28 mmol/L (ref 22–32)
CREATININE: 1.02 mg/dL — AB (ref 0.44–1.00)
Calcium: 9.6 mg/dL (ref 8.9–10.3)
GFR calc non Af Amer: 51 mL/min — ABNORMAL LOW (ref >60)
GFR, EST AFRICAN AMERICAN: 59 mL/min — AB (ref >60)
Glucose, Bld: 111 mg/dL — ABNORMAL HIGH (ref 65–99)
Potassium: 3.8 mmol/L (ref 3.5–5.1)
Sodium: 138 mmol/L (ref 135–145)

## 2015-02-08 LAB — CBC
HCT: 33.8 % — ABNORMAL LOW (ref 36.0–46.0)
Hemoglobin: 11 g/dL — ABNORMAL LOW (ref 12.0–15.0)
MCH: 33.6 pg (ref 26.0–34.0)
MCHC: 32.5 g/dL (ref 30.0–36.0)
MCV: 103.4 fL — AB (ref 78.0–100.0)
Platelets: 223 10*3/uL (ref 150–400)
RBC: 3.27 MIL/uL — ABNORMAL LOW (ref 3.87–5.11)
RDW: 13.6 % (ref 11.5–15.5)
WBC: 5.5 10*3/uL (ref 4.0–10.5)

## 2015-02-08 LAB — ABO/RH: ABO/RH(D): A POS

## 2015-02-08 LAB — SURGICAL PCR SCREEN
MRSA, PCR: NEGATIVE
STAPHYLOCOCCUS AUREUS: NEGATIVE

## 2015-02-08 LAB — PROTIME-INR
INR: 1.28 (ref 0.00–1.49)
PROTHROMBIN TIME: 16.1 s — AB (ref 11.6–15.2)

## 2015-02-08 NOTE — Progress Notes (Addendum)
Anesthesia Chart Review: Patient is a 81 year old female scheduled for right L4-5 microdiscectomy on 02/10/15 by Dr. Vertell Limber.  History includes non-smoker, murmur (no significant valvular disease by 2010 echo), SSS/CHB s/p PPM (generator change 06/17/06: Generator Adapta ADDR01, serial #[id number] with 4024 ventricular lead serial #[id number] and 5554 atrial lead serial #[id number] 02/23/1998), afib, HTN, anxiety, depression, GERD, arthritis, right breast cancer, anemia. PCP is Dr. Tracie Harrier with [facility] (see Care Everywhere). He felt it was okay to hold warfarin for surgery.  Patient also her cardiology office ([facility]) regarding plans for surgery (see note in Care Everywhere) and was told just to have her surgeon restart warfarin post-operatively as soon as allowed. Last appointment with EP cardiology (PPM interrogation) was 11/16/14 with Dr. Cira Servant. She reported new EP cardiologist as Dr. Fatima Sanger.  Meds include Xanax, Sinemet, Coreg, Celexa, 65 Fe, Neurontin, Norco, Cozaar, MVI, Prilosec, Zocor, trazodone, warfarin.    02/08/15 EKG: V-paced rhythm.  04/12/09 Echo ([place], Care Everywhere): NORMAL LEFT VENTRICULAR SYSTOLIC FUNCTION NORMAL RIGHT VENTRICULAR SYSTOLIC FUNCTION VALVULAR REGURGITATION: MILD AR, MILD MR, TRIVIAL PR, MILD TR NO VALVULAR STENOSIS  Preoperative labs noted. PT/INR 16.1/1.28. Coumadin was held following her 02/05/15 dose.  I'm awaiting additional cardiology records, PPM perioperative RX form.  Chart will be left for follow-up.  George Hugh [facility] Short Stay Center/Anesthesiology Phone [phone] 02/08/2015 3:23 PM  Addendum: Cardiology records and PPM perioperative form are still pending.  I called and spoke with Mickel Baas at [facility].  She confirmed that most recent records are in [place].  She had me fax PPM form to [phone] which is the front desk at the EP clinic. Hopefully we will receive by tomorrow. Although she  has a history of a dual chamber PPM (06/17/06 Operative Note can be viewed under Care Everywhere, Other Results tab, Operative Report), but there is no reported CAD.  Her PCP has given her instructions regarding anticoagulation therapy and her cardiology office was also notified.  She will be further evaluated by her assigned anesthesiologist on the day of surgery. If no acute issues then I would anticipate that she could proceed as planned.    George Hugh [facility] Short Stay Center/Anesthesiology Phone [phone] 02/09/2015 2:53 PM

## 2015-02-08 NOTE — Progress Notes (Signed)
req'd cardiac notes ekg office notes from [facility], also faxed pacemaker orders there. [phone] ruth greenfield prior dr last visit 1/16  Dr Elenore Rota hegland new dr next visit 8/16.  req'd office notes, cardiac notes,ekg from dr Cherlyn Labella hande  [facility](takes care of coumadin).[phone]

## 2015-02-08 NOTE — Pre-Procedure Instructions (Addendum)
Sally Carroll  02/08/2015      [facility] [place] [place] [address] Phone: [phone] Fax: [phone]  [facility] [place], [place] [address] Phone: [phone] Fax: [phone]    Your procedure is scheduled on 02/10/15.  Report to [facility] short stay admitting at 1115 A.M.  Call this number if you have problems the morning of surgery:  [phone]   Remember:  Do not eat food or drink liquids after midnight.  Take these medicines the morning of surgery with A SIP OF WATER carvedilol(coreg), celexa, gabapentin, pain if needed, omeprazole     STOP all herbel meds, nsaids (aleve,naproxen,advil,ibuprofen) 5 days prior to surgery starting now including biotin, vit D, multi vitamins     Stop coumadin per dr   Lazaro Arms not wear jewelry, make-up or nail polish.  Do not wear lotions, powders, or perfumes.  You may wear deodorant.  Do not shave 48 hours prior to surgery.  Men may shave face and neck.  Do not bring valuables to the hospital.  [facility] is not responsible for any belongings or valuables.  Contacts, dentures or bridgework may not be worn into surgery.  Leave your suitcase in the car.  After surgery it may be brought to your room.  For patients admitted to the hospital, discharge time will be determined by your treatment team.  Patients discharged the day of surgery will not be allowed to drive home.   Name and phone number of your driver:    Special instructions:   Special Instructions: [place] - Preparing for Surgery  Before surgery, you can play an important role.  Because skin is not sterile, your skin needs to be as free of germs as possible.  You can reduce the number of germs on you skin by washing with CHG (chlorahexidine gluconate) soap before surgery.  CHG is an antiseptic cleaner which kills germs and bonds with the skin  to continue killing germs even after washing.  Please DO NOT use if you have an allergy to CHG or antibacterial soaps.  If your skin becomes reddened/irritated stop using the CHG and inform your nurse when you arrive at Short Stay.  Do not shave (including legs and underarms) for at least 48 hours prior to the first CHG shower.  You may shave your face.  Please follow these instructions carefully:   1.  Shower with CHG Soap the night before surgery and the morning of Surgery.  2.  If you choose to wash your hair, wash your hair first as usual with your normal shampoo.  3.  After you shampoo, rinse your hair and body thoroughly to remove the Shampoo.  4.  Use CHG as you would any other liquid soap.  You can apply chg directly  to the skin and wash gently with scrungie or a clean washcloth.  5.  Apply the CHG Soap to your body ONLY FROM THE NECK DOWN.  Do not use on open wounds or open sores.  Avoid contact with your eyes ears, mouth and genitals (private parts).  Wash genitals (private parts)       with your normal soap.  6.  Wash thoroughly, paying special attention to the area where your surgery will be performed.  7.  Thoroughly rinse your body with warm water from the neck down.  8.  DO NOT shower/wash with your normal soap after using and rinsing off the CHG Soap.  9.  Pat yourself dry with a clean towel.            10.  Wear clean pajamas.            11.  Place clean sheets on your bed the night of your first shower and do not sleep with pets.  Day of Surgery  Do not apply any lotions/deodorants the morning of surgery.  Please wear clean clothes to the hospital/surgery center.  Please read over the following fact sheets that you were given. MRSA Information and Surgical Site Infection Prevention

## 2015-02-09 MED ORDER — CEFAZOLIN SODIUM-DEXTROSE 2-3 GM-% IV SOLR
2.0000 g | INTRAVENOUS | Status: AC
Start: 2015-02-10 — End: 2015-02-10
  Administered 2015-02-10: 2 g via INTRAVENOUS
  Filled 2015-02-09: qty 50

## 2015-02-09 NOTE — H&P (Signed)
Patient ID:   [id number] Patient: Sally Carroll  Date of Birth: [birth year] Visit Type: Office Visit   Date: 02/01/2015 10:30 AM Provider: Marchia Meiers. Vertell Limber MD   This 81 year old female presents for back pain and Leg pain.  History of Present Illness: 1.  back pain  2.  Leg pain  Sally Carroll, 81 year old retired female visits reporting lumbar and right lower extremity pain numbness and tingling since February.  She recalls no injury, stating she awoke with right leg pain.  Right greater trochanteric injection offered no relief ESI in March offered no relief, hypertension and low-grade fever 1 month post procedure  Norco 7.5/325 one and one half 3 times a day Neurontin 100 mg two 3 times a day  Coumadin daily  History: HTN, breast cancer, depression, A. fib? Surgical history: Pacemaker 1999, right breast biopsy with radiation treatments 2009  CT November 21, 2014 [facility]  Patient describes agonizing pain into her right leg.  Her CT scan demonstrates a disc herniation at L4 L5 on the right with foraminal L4 nerve root compression as well as significant right L5 nerve root compression.  She is marked spondylosis at this level.  The patient is miserable and is not able to get any relief whatsoever.  She is worried about the effects of premedication on her cognition as is her family and they are worried about her risk of falling and injuring herself.        PAST MEDICAL/SURGICAL HISTORY   (Detailed)  Disease/disorder Onset Date Management Date Comments    Cardiac pacemaker 1999   Anemia      Anxiety      Arthritis      Depression      High cholesterol      Hypertension         PAST MEDICAL HISTORY, SURGICAL HISTORY, FAMILY HISTORY, SOCIAL HISTORY AND REVIEW OF SYSTEMS I have reviewed the patient's past medical, surgical, family and social history as well as the comprehensive review of systems as included on the [facility] history form dated  02/01/2015, which I have signed.  Family History  (Detailed) Relationship Family Member Name Deceased Age at Death Condition Onset Age Cause of Death      Family history of Hypertension  N  Father    Cancer, bone  N  Father    Cancer, lung  N    SOCIAL HISTORY  (Detailed) Tobacco use reviewed. Preferred language is Unknown.   Smoking status: Never smoker.  SMOKING STATUS Use Status Type Smoking Status Usage Per Day Years Used Total Pack Years  no/never  Never smoker       HOME ENVIRONMENT/SAFETY The patient has not fallen in the last year.        MEDICATIONS(added, continued or stopped this visit): Started Medication Directions Instruction Stopped   alprazolam 0.25 mg tablet take 1 tablet by oral route  every day     carbidopa-levodopa take 1 tablet by oral route 4 times every day     carvedilol 6.25 mg tablet take 0.5 tablet by oral route 2 times every day with food     Centrum Silver Take as directed     citalopram 10 mg tablet take 1 tablet by oral route  every day     iron Take as directed     losartan 100 mg tablet take 1 tablet by oral route  every day     Miralax 17 gram/dose oral powder Take as directed  Neurontin 100 mg capsule take 2 capsule by oral route 3 times every day     Norco take 1 tablet by oral route  every 8 hours as needed for pain  02/01/2015  02/01/2015 Norco 10 mg-325 mg tablet take 1 tablet by oral route  every 8 hours as needed for pain     omeprazole 20 mg tablet,delayed release take 1 capsule by oral route  every day     simvastatin 10 mg tablet take 1 tablet by oral route  every day in the evening     trazodone 50 mg tablet take 1 tablet by oral route  every day at bedtime     Vitamin D3 1,000 unit capsule Take 1 capsule twice daily     warfarin 5 mg tablet take 1 tablet by oral route  every day       ALLERGIES: Ingredient Reaction Medication Name Comment  PREDNISONE Tremors     Reviewed, updated.    Vitals Date Temp F BP Pulse  Ht In Wt Lb BMI BSA Pain Score  02/01/2015  168/79 83 68 120 18.25  8/10     PHYSICAL EXAM General Level of Distress: no acute distress Overall Appearance: normal    Cardiovascular Cardiac: regular rate and rhythm without murmur  Respiratory Lungs: clear to auscultation  Neurological Recent and Remote Memory: normal Attention Span and Concentration:   normal Language: normal Fund of Knowledge: normal  Right Left Sensation: normal normal Upper Extremity Coordination: normal normal  Lower Extremity Coordination: normal normal  Musculoskeletal Gait and Station: normal  Right Left Upper Extremity Muscle Strength: normal normal Lower Extremity Muscle Strength: normal normal Upper Extremity Muscle Tone:  normal normal Lower Extremity Muscle Tone: normal normal  Motor Strength Upper and lower extremity motor strength was tested in the clinically pertinent muscles. Any abnormal findings will be noted below.   Right Left Tib Anterior: 4/5  EHL: 4-/5    Deep Tendon Reflexes  Right Left Biceps: normal normal Triceps: normal normal Brachiloradialis: normal normal Patellar: normal normal Achilles: normal normal  Sensory Sensation was tested at L1 to S1.   Cranial Nerves II. Optic Nerve/Visual Fields: normal III. Oculomotor: normal IV. Trochlear: normal V. Trigeminal: normal VI. Abducens: normal VII. Facial: normal VIII. Acoustic/Vestibular: normal IX. Glossopharyngeal: normal X. Vagus: normal XI. Spinal Accessory: normal XII. Hypoglossal: normal  Motor and other Tests Lhermittes: negative Rhomberg: negative    Right Left Hoffman's: normal normal Clonus: normal normal Babinski: normal normal SLR: positive at 25 degrees negative Patrick's Corky Sox): negative negative Toe Walk: normal normal Toe Lift: normal normal Heel Walk: normal normal SI Joint: nontender nontender   Additional Findings:  Patient has decreased ability to squat on her right leg.   She has positive straight leg raise on the right.  She has decreased pain sensation in the right L4 distribution.  She has right hip abductor strength at 4 out of 5.    IMPRESSION Patient has significant disc herniation L4 L5 right with right L4 and right L5 nerve root compression.  She is in misery.  She has significant weakness.  Completed Orders (this encounter) Order Details Reason Side Interpretation Result Initial Treatment Date Region  Hypertension education Follow up with primary care physician.         Assessment/Plan # Detail Type Description   1. Assessment Disc displacement, lumbar (M51.26).       2. Assessment Low back pain, unspecified back pain laterality, with sciatica presence unspecified (M54.5).  3. Assessment Radiculopathy, lumbar region (M54.16).       4. Assessment Spinal stenosis of lumbar region (M48.06).       5. Assessment Essential (primary) hypertension (I10).         Pain Assessment/Treatment Pain Scale: 8/10. Method: Numeric Pain Intensity Scale. Location: low back/right leg. Onset: 10/03/2014. Duration: varies. Quality: discomforting. Pain Assessment/Treatment follow-up plan of care: Patient is taking medications as prescribed..  Fall Risk Plan The patient has not fallen in the last year.  Right L4 L5 microdiscectomy with decompression of both the L4 and L5 nerve roots.  Risks and benefits were discussed in detail with the patient and her family and they wish to proceed.  She will come off Coumadin prior to surgery for which she is on because of a history of atrial fibrillation.  Orders: Instruction(s)/Education: Assessment Instruction  I10 Hypertension education    MEDICATIONS PRESCRIBED TODAY    Rx Quantity Refills  NORCO 10 mg-325 mg  90 0            Provider:  Marchia Meiers. Vertell Limber MD  02/02/2015 11:38 AM Dictation edited by: Marchia Meiers. Vertell Limber    CC Providers: Erline Levine MD [address] [place], [place]  [phone]              Electronically signed by Marchia Meiers. Vertell Limber MD on 02/02/2015 11:38 AM

## 2015-02-10 ENCOUNTER — Ambulatory Visit (HOSPITAL_COMMUNITY): Payer: Medicare PPO | Admitting: Vascular Surgery

## 2015-02-10 ENCOUNTER — Ambulatory Visit (HOSPITAL_COMMUNITY): Payer: Medicare PPO | Admitting: Anesthesiology

## 2015-02-10 ENCOUNTER — Ambulatory Visit (HOSPITAL_COMMUNITY): Payer: Medicare PPO

## 2015-02-10 ENCOUNTER — Encounter (HOSPITAL_COMMUNITY): Payer: Self-pay | Admitting: Anesthesiology

## 2015-02-10 ENCOUNTER — Encounter (HOSPITAL_COMMUNITY)
Admission: RE | Disposition: A | Discharge status: Skilled Nursing Facility | Payer: Self-pay | Source: Ambulatory Visit | Attending: Neurosurgery

## 2015-02-10 ENCOUNTER — Inpatient Hospital Stay (HOSPITAL_COMMUNITY)
Admission: RE | Admit: 2015-02-10 | Discharge: 2015-02-16 | DRG: 519 | Disposition: A | Discharge status: Skilled Nursing Facility | Payer: Medicare PPO | Source: Ambulatory Visit | Attending: Neurosurgery | Admitting: Neurosurgery

## 2015-02-10 DIAGNOSIS — M47816 Spondylosis without myelopathy or radiculopathy, lumbar region: Secondary | ICD-10-CM | POA: Diagnosis present

## 2015-02-10 DIAGNOSIS — M4806 Spinal stenosis, lumbar region: Secondary | ICD-10-CM | POA: Diagnosis present

## 2015-02-10 DIAGNOSIS — D649 Anemia, unspecified: Secondary | ICD-10-CM | POA: Diagnosis present

## 2015-02-10 DIAGNOSIS — Z7901 Long term (current) use of anticoagulants: Secondary | ICD-10-CM | POA: Diagnosis not present

## 2015-02-10 DIAGNOSIS — Y92234 Operating room of hospital as the place of occurrence of the external cause: Secondary | ICD-10-CM | POA: Diagnosis not present

## 2015-02-10 DIAGNOSIS — Z923 Personal history of irradiation: Secondary | ICD-10-CM

## 2015-02-10 DIAGNOSIS — Y658 Other specified misadventures during surgical and medical care: Secondary | ICD-10-CM | POA: Diagnosis not present

## 2015-02-10 DIAGNOSIS — Z79899 Other long term (current) drug therapy: Secondary | ICD-10-CM | POA: Diagnosis not present

## 2015-02-10 DIAGNOSIS — I10 Essential (primary) hypertension: Secondary | ICD-10-CM | POA: Diagnosis present

## 2015-02-10 DIAGNOSIS — M5116 Intervertebral disc disorders with radiculopathy, lumbar region: Secondary | ICD-10-CM | POA: Diagnosis present

## 2015-02-10 DIAGNOSIS — M1990 Unspecified osteoarthritis, unspecified site: Secondary | ICD-10-CM | POA: Diagnosis present

## 2015-02-10 DIAGNOSIS — Z8249 Family history of ischemic heart disease and other diseases of the circulatory system: Secondary | ICD-10-CM

## 2015-02-10 DIAGNOSIS — G960 Cerebrospinal fluid leak: Secondary | ICD-10-CM | POA: Diagnosis not present

## 2015-02-10 DIAGNOSIS — M5126 Other intervertebral disc displacement, lumbar region: Secondary | ICD-10-CM | POA: Diagnosis present

## 2015-02-10 DIAGNOSIS — E780 Pure hypercholesterolemia: Secondary | ICD-10-CM | POA: Diagnosis present

## 2015-02-10 DIAGNOSIS — Z950 Presence of cardiac pacemaker: Secondary | ICD-10-CM

## 2015-02-10 DIAGNOSIS — F419 Anxiety disorder, unspecified: Secondary | ICD-10-CM | POA: Diagnosis present

## 2015-02-10 DIAGNOSIS — G9741 Accidental puncture or laceration of dura during a procedure: Secondary | ICD-10-CM | POA: Diagnosis not present

## 2015-02-10 DIAGNOSIS — I4891 Unspecified atrial fibrillation: Secondary | ICD-10-CM | POA: Diagnosis present

## 2015-02-10 DIAGNOSIS — Z888 Allergy status to other drugs, medicaments and biological substances status: Secondary | ICD-10-CM

## 2015-02-10 DIAGNOSIS — F329 Major depressive disorder, single episode, unspecified: Secondary | ICD-10-CM | POA: Diagnosis present

## 2015-02-10 DIAGNOSIS — Z853 Personal history of malignant neoplasm of breast: Secondary | ICD-10-CM | POA: Diagnosis not present

## 2015-02-10 HISTORY — PX: LUMBAR LAMINECTOMY/DECOMPRESSION MICRODISCECTOMY: SHX5026

## 2015-02-10 LAB — GLUCOSE, CAPILLARY: Glucose-Capillary: 121 mg/dL — ABNORMAL HIGH (ref 65–99)

## 2015-02-10 SURGERY — LUMBAR LAMINECTOMY/DECOMPRESSION MICRODISCECTOMY 1 LEVEL
Anesthesia: General | Site: Spine Lumbar | Laterality: Right

## 2015-02-10 MED ORDER — OXYCODONE-ACETAMINOPHEN 5-325 MG PO TABS
1.0000 | ORAL_TABLET | ORAL | Status: DC | PRN
  Administered 2015-02-12: 2 via ORAL
  Administered 2015-02-14: 1 via ORAL
  Filled 2015-02-10 (×2): qty 1
  Filled 2015-02-10: qty 2

## 2015-02-10 MED ORDER — TRAZODONE HCL 100 MG PO TABS
100.0000 mg | ORAL_TABLET | Freq: Every day | ORAL | Status: DC
  Administered 2015-02-10 – 2015-02-15 (×6): 100 mg via ORAL
  Filled 2015-02-10 (×8): qty 1

## 2015-02-10 MED ORDER — PROPOFOL 10 MG/ML IV BOLUS
INTRAVENOUS | Status: DC | PRN
  Administered 2015-02-10: 20 mg via INTRAVENOUS
  Administered 2015-02-10: 100 mg via INTRAVENOUS

## 2015-02-10 MED ORDER — HYDROMORPHONE HCL 1 MG/ML IJ SOLN
0.5000 mg | INTRAMUSCULAR | Status: DC | PRN
  Administered 2015-02-11 – 2015-02-13 (×5): 1 mg via INTRAVENOUS
  Filled 2015-02-10 (×5): qty 1

## 2015-02-10 MED ORDER — VITAMIN D 1000 UNITS PO TABS
1000.0000 [IU] | ORAL_TABLET | Freq: Two times a day (BID) | ORAL | Status: DC
  Administered 2015-02-10 – 2015-02-15 (×11): 1000 [IU] via ORAL
  Filled 2015-02-10 (×11): qty 1

## 2015-02-10 MED ORDER — OXYCODONE HCL 5 MG PO TABS: 5.0000 mg | ORAL_TABLET | Freq: Once | ORAL | Status: DC | PRN

## 2015-02-10 MED ORDER — FERROUS SULFATE 325 (65 FE) MG PO TABS
325.0000 mg | ORAL_TABLET | Freq: Every day | ORAL | Status: DC
  Administered 2015-02-11 – 2015-02-15 (×5): 325 mg via ORAL
  Filled 2015-02-10 (×5): qty 1

## 2015-02-10 MED ORDER — KCL IN DEXTROSE-NACL 20-5-0.45 MEQ/L-%-% IV SOLN
INTRAVENOUS | Status: DC
  Administered 2015-02-10: 19:00:00 via INTRAVENOUS
  Filled 2015-02-10: qty 1000

## 2015-02-10 MED ORDER — ALUM & MAG HYDROXIDE-SIMETH 200-200-20 MG/5ML PO SUSP: 30.0000 mL | Freq: Four times a day (QID) | ORAL | Status: DC | PRN

## 2015-02-10 MED ORDER — FLEET ENEMA 7-19 GM/118ML RE ENEM: 1.0000 | ENEMA | Freq: Once | RECTAL | Status: AC | PRN

## 2015-02-10 MED ORDER — FENTANYL CITRATE (PF) 100 MCG/2ML IJ SOLN
INTRAMUSCULAR | Status: DC
Start: 2015-02-10 — End: 2015-02-10
  Filled 2015-02-10: qty 2

## 2015-02-10 MED ORDER — ACETAMINOPHEN 325 MG PO TABS
650.0000 mg | ORAL_TABLET | ORAL | Status: DC | PRN
  Administered 2015-02-13 – 2015-02-15 (×2): 650 mg via ORAL
  Filled 2015-02-10 (×3): qty 2

## 2015-02-10 MED ORDER — PANTOPRAZOLE SODIUM 40 MG PO TBEC: 40.0000 mg | DELAYED_RELEASE_TABLET | Freq: Every day | ORAL | Status: DC

## 2015-02-10 MED ORDER — PHENOL 1.4 % MT LIQD: 1.0000 | OROMUCOSAL | Status: DC | PRN

## 2015-02-10 MED ORDER — ONDANSETRON HCL 4 MG/2ML IJ SOLN
INTRAMUSCULAR | Status: AC
  Filled 2015-02-10: qty 2

## 2015-02-10 MED ORDER — ONDANSETRON HCL 4 MG/2ML IJ SOLN
INTRAMUSCULAR | Status: AC
  Filled 2015-02-10: qty 4

## 2015-02-10 MED ORDER — HYDROMORPHONE HCL 1 MG/ML IJ SOLN
0.2500 mg | INTRAMUSCULAR | Status: DC | PRN
  Administered 2015-02-10 (×2): 0.5 mg via INTRAVENOUS

## 2015-02-10 MED ORDER — GABAPENTIN 100 MG PO CAPS
200.0000 mg | ORAL_CAPSULE | Freq: Three times a day (TID) | ORAL | Status: DC
  Administered 2015-02-10 – 2015-02-15 (×17): 200 mg via ORAL
  Filled 2015-02-10 (×17): qty 2

## 2015-02-10 MED ORDER — ALPRAZOLAM 0.25 MG PO TABS
0.2500 mg | ORAL_TABLET | Freq: Every day | ORAL | Status: DC
  Administered 2015-02-10 – 2015-02-15 (×6): 0.25 mg via ORAL
  Filled 2015-02-10 (×6): qty 1

## 2015-02-10 MED ORDER — HYDROMORPHONE HCL 1 MG/ML IJ SOLN
INTRAMUSCULAR | Status: AC
  Filled 2015-02-10: qty 1

## 2015-02-10 MED ORDER — GLYCOPYRROLATE 0.2 MG/ML IJ SOLN
INTRAMUSCULAR | Status: DC | PRN
  Administered 2015-02-10: 0.6 mg via INTRAVENOUS

## 2015-02-10 MED ORDER — SODIUM CHLORIDE 0.9 % IJ SOLN
3.0000 mL | Freq: Two times a day (BID) | INTRAMUSCULAR | Status: DC
  Administered 2015-02-10 – 2015-02-15 (×9): 3 mL via INTRAVENOUS

## 2015-02-10 MED ORDER — ALPRAZOLAM 0.25 MG PO TABS
ORAL_TABLET | ORAL | Status: AC
Start: 2015-02-10 — End: 2015-02-11
  Filled 2015-02-10: qty 1

## 2015-02-10 MED ORDER — LIDOCAINE HCL (CARDIAC) 20 MG/ML IV SOLN
INTRAVENOUS | Status: DC | PRN
  Administered 2015-02-10: 60 mg via INTRAVENOUS

## 2015-02-10 MED ORDER — MENTHOL 3 MG MT LOZG: 1.0000 | LOZENGE | OROMUCOSAL | Status: DC | PRN

## 2015-02-10 MED ORDER — FENTANYL CITRATE (PF) 250 MCG/5ML IJ SOLN
INTRAMUSCULAR | Status: AC
  Filled 2015-02-10: qty 5

## 2015-02-10 MED ORDER — ONDANSETRON HCL 4 MG/2ML IJ SOLN
INTRAMUSCULAR | Status: DC | PRN
  Administered 2015-02-10: 4 mg via INTRAVENOUS

## 2015-02-10 MED ORDER — LIDOCAINE-EPINEPHRINE 1 %-1:100000 IJ SOLN
INTRAMUSCULAR | Status: DC | PRN
  Administered 2015-02-10: 5 mL

## 2015-02-10 MED ORDER — CARBIDOPA-LEVODOPA 25-100 MG PO TABS
1.0000 | ORAL_TABLET | Freq: Three times a day (TID) | ORAL | Status: DC
  Administered 2015-02-10 (×2): 1 via ORAL
  Administered 2015-02-11 (×2): 2 via ORAL
  Administered 2015-02-11: 1 via ORAL
  Administered 2015-02-12: 2 via ORAL
  Administered 2015-02-12: 1 via ORAL
  Administered 2015-02-12: 2 via ORAL
  Administered 2015-02-13 (×2): 1 via ORAL
  Filled 2015-02-10: qty 2
  Filled 2015-02-10: qty 1
  Filled 2015-02-10: qty 2
  Filled 2015-02-10: qty 1
  Filled 2015-02-10: qty 2
  Filled 2015-02-10 (×2): qty 1
  Filled 2015-02-10: qty 2
  Filled 2015-02-10 (×2): qty 1

## 2015-02-10 MED ORDER — ACETAMINOPHEN 325 MG PO TABS: 325.0000 mg | ORAL_TABLET | ORAL | Status: DC | PRN

## 2015-02-10 MED ORDER — 0.9 % SODIUM CHLORIDE (POUR BTL) OPTIME
TOPICAL | Status: DC | PRN
  Administered 2015-02-10: 1000 mL

## 2015-02-10 MED ORDER — PRESERVISION AREDS PO CAPS
1.0000 | ORAL_CAPSULE | Freq: Two times a day (BID) | ORAL | Status: DC
Start: 2015-02-10 — End: 2015-02-10

## 2015-02-10 MED ORDER — CARVEDILOL 3.125 MG PO TABS
3.1250 mg | ORAL_TABLET | Freq: Two times a day (BID) | ORAL | Status: DC
  Administered 2015-02-10 – 2015-02-15 (×11): 3.125 mg via ORAL
  Filled 2015-02-10 (×11): qty 1

## 2015-02-10 MED ORDER — SODIUM CHLORIDE 0.9 % IJ SOLN: 3.0000 mL | INTRAMUSCULAR | Status: DC | PRN

## 2015-02-10 MED ORDER — OXYCODONE HCL 5 MG/5ML PO SOLN: 5.0000 mg | Freq: Once | ORAL | Status: DC | PRN

## 2015-02-10 MED ORDER — DOCUSATE SODIUM 100 MG PO CAPS: 100.0000 mg | ORAL_CAPSULE | Freq: Two times a day (BID) | ORAL | Status: DC

## 2015-02-10 MED ORDER — THROMBIN 5000 UNITS EX SOLR
CUTANEOUS | Status: DC | PRN
  Administered 2015-02-10 (×2): 5000 [IU] via TOPICAL

## 2015-02-10 MED ORDER — METHOCARBAMOL 1000 MG/10ML IJ SOLN
500.0000 mg | Freq: Four times a day (QID) | INTRAVENOUS | Status: DC | PRN
  Filled 2015-02-10: qty 5

## 2015-02-10 MED ORDER — SENNOSIDES-DOCUSATE SODIUM 8.6-50 MG PO TABS
1.0000 | ORAL_TABLET | Freq: Every day | ORAL | Status: DC
  Administered 2015-02-10 – 2015-02-15 (×5): 1 via ORAL
  Filled 2015-02-10 (×11): qty 1

## 2015-02-10 MED ORDER — SENNOSIDES-DOCUSATE SODIUM 8.6-50 MG PO TABS
1.0000 | ORAL_TABLET | Freq: Every evening | ORAL | Status: DC | PRN
  Administered 2015-02-10: 1 via ORAL
  Filled 2015-02-10: qty 1

## 2015-02-10 MED ORDER — ACETAMINOPHEN 650 MG RE SUPP: 650.0000 mg | RECTAL | Status: DC | PRN

## 2015-02-10 MED ORDER — HYDROCODONE-ACETAMINOPHEN 5-325 MG PO TABS: 1.0000 | ORAL_TABLET | ORAL | Status: DC | PRN

## 2015-02-10 MED ORDER — BUPIVACAINE HCL (PF) 0.5 % IJ SOLN
INTRAMUSCULAR | Status: DC | PRN
  Administered 2015-02-10: 5 mL

## 2015-02-10 MED ORDER — ONDANSETRON HCL 4 MG/2ML IJ SOLN: 4.0000 mg | INTRAMUSCULAR | Status: DC | PRN

## 2015-02-10 MED ORDER — FENTANYL CITRATE (PF) 250 MCG/5ML IJ SOLN
INTRAMUSCULAR | Status: DC | PRN
  Administered 2015-02-10: 50 ug via INTRAVENOUS
  Administered 2015-02-10: 100 ug via INTRAVENOUS
  Administered 2015-02-10: 50 ug via INTRAVENOUS

## 2015-02-10 MED ORDER — BISACODYL 10 MG RE SUPP
10.0000 mg | Freq: Every day | RECTAL | Status: DC | PRN
  Administered 2015-02-14: 10 mg via RECTAL
  Filled 2015-02-10 (×2): qty 1

## 2015-02-10 MED ORDER — PANTOPRAZOLE SODIUM 40 MG IV SOLR
40.0000 mg | Freq: Every day | INTRAVENOUS | Status: DC
  Administered 2015-02-10 – 2015-02-11 (×2): 40 mg via INTRAVENOUS
  Filled 2015-02-10 (×2): qty 40

## 2015-02-10 MED ORDER — LOSARTAN POTASSIUM 50 MG PO TABS
100.0000 mg | ORAL_TABLET | Freq: Every day | ORAL | Status: DC
  Administered 2015-02-10 – 2015-02-15 (×6): 100 mg via ORAL
  Filled 2015-02-10 (×7): qty 2

## 2015-02-10 MED ORDER — HEMOSTATIC AGENTS (NO CHARGE) OPTIME
TOPICAL | Status: DC | PRN
  Administered 2015-02-10: 1 via TOPICAL

## 2015-02-10 MED ORDER — CITALOPRAM HYDROBROMIDE 10 MG PO TABS
10.0000 mg | ORAL_TABLET | Freq: Every day | ORAL | Status: DC
  Administered 2015-02-11 – 2015-02-15 (×5): 10 mg via ORAL
  Filled 2015-02-10 (×5): qty 1

## 2015-02-10 MED ORDER — ADULT MULTIVITAMIN W/MINERALS CH
1.0000 | ORAL_TABLET | Freq: Every day | ORAL | Status: DC
  Administered 2015-02-10 – 2015-02-15 (×6): 1 via ORAL
  Filled 2015-02-10 (×11): qty 1

## 2015-02-10 MED ORDER — ROCURONIUM BROMIDE 100 MG/10ML IV SOLN
INTRAVENOUS | Status: DC | PRN
  Administered 2015-02-10: 40 mg via INTRAVENOUS

## 2015-02-10 MED ORDER — CEFAZOLIN SODIUM 1-5 GM-% IV SOLN
1.0000 g | Freq: Three times a day (TID) | INTRAVENOUS | Status: AC
  Administered 2015-02-10 – 2015-02-11 (×2): 1 g via INTRAVENOUS
  Filled 2015-02-10 (×2): qty 50

## 2015-02-10 MED ORDER — ROCURONIUM BROMIDE 50 MG/5ML IV SOLN
INTRAVENOUS | Status: AC
  Filled 2015-02-10: qty 1

## 2015-02-10 MED ORDER — SIMVASTATIN 5 MG PO TABS
10.0000 mg | ORAL_TABLET | Freq: Every day | ORAL | Status: DC
  Administered 2015-02-10 – 2015-02-15 (×6): 10 mg via ORAL
  Filled 2015-02-10 (×6): qty 2

## 2015-02-10 MED ORDER — HYDROCODONE-ACETAMINOPHEN 10-325 MG PO TABS
1.0000 | ORAL_TABLET | Freq: Three times a day (TID) | ORAL | Status: DC
  Administered 2015-02-10 – 2015-02-13 (×11): 1 via ORAL
  Filled 2015-02-10 (×11): qty 1

## 2015-02-10 MED ORDER — SODIUM CHLORIDE 0.9 % IV SOLN: 250.0000 mL | INTRAVENOUS | Status: DC

## 2015-02-10 MED ORDER — NEOSTIGMINE METHYLSULFATE 10 MG/10ML IV SOLN
INTRAVENOUS | Status: DC | PRN
  Administered 2015-02-10: 4 mg via INTRAVENOUS

## 2015-02-10 MED ORDER — ALPRAZOLAM 0.25 MG PO TABS
0.2500 mg | ORAL_TABLET | Freq: Once | ORAL | Status: AC | PRN
  Administered 2015-02-10: 0.25 mg via ORAL

## 2015-02-10 MED ORDER — LACTATED RINGERS IV SOLN
INTRAVENOUS | Status: DC
  Administered 2015-02-10 (×2): via INTRAVENOUS

## 2015-02-10 MED ORDER — METHOCARBAMOL 500 MG PO TABS
500.0000 mg | ORAL_TABLET | Freq: Four times a day (QID) | ORAL | Status: DC | PRN
  Administered 2015-02-10: 500 mg via ORAL
  Filled 2015-02-10 (×2): qty 1

## 2015-02-10 MED ORDER — ACETAMINOPHEN 160 MG/5ML PO SOLN
325.0000 mg | ORAL | Status: DC | PRN
  Filled 2015-02-10: qty 20.3

## 2015-02-10 SURGICAL SUPPLY — 58 items
BENZOIN TINCTURE PRP APPL 2/3 (GAUZE/BANDAGES/DRESSINGS) IMPLANT
BIT DRILL NEURO 2X3.1 SFT TUCH (MISCELLANEOUS) ×1 IMPLANT
BLADE CLIPPER SURG (BLADE) IMPLANT
BUR ROUND FLUTED 5 RND (BURR) ×2 IMPLANT
CANISTER SUCT 3000ML PPV (MISCELLANEOUS) ×2 IMPLANT
CONT SPEC 4OZ CLIKSEAL STRL BL (MISCELLANEOUS) ×2 IMPLANT
DECANTER SPIKE VIAL GLASS SM (MISCELLANEOUS) ×2 IMPLANT
DERMABOND ADVANCED (GAUZE/BANDAGES/DRESSINGS) ×1
DERMABOND ADVANCED .7 DNX12 (GAUZE/BANDAGES/DRESSINGS) ×1 IMPLANT
DRAPE LAPAROTOMY 100X72X124 (DRAPES) ×2 IMPLANT
DRAPE MICROSCOPE LEICA (MISCELLANEOUS) ×2 IMPLANT
DRAPE POUCH INSTRU U-SHP 10X18 (DRAPES) ×2 IMPLANT
DRAPE SURG 17X23 STRL (DRAPES) ×2 IMPLANT
DRILL NEURO 2X3.1 SOFT TOUCH (MISCELLANEOUS) ×2
DRSG OPSITE POSTOP 3X4 (GAUZE/BANDAGES/DRESSINGS) ×2 IMPLANT
DRSG TELFA 3X8 NADH (GAUZE/BANDAGES/DRESSINGS) IMPLANT
DURAPREP 26ML APPLICATOR (WOUND CARE) ×2 IMPLANT
DURASEAL APPLICATOR TIP (TIP) ×2 IMPLANT
DURASEAL SPINE SEALANT 3ML (MISCELLANEOUS) ×2 IMPLANT
ELECT REM PT RETURN 9FT ADLT (ELECTROSURGICAL) ×2
ELECTRODE REM PT RTRN 9FT ADLT (ELECTROSURGICAL) ×1 IMPLANT
GAUZE SPONGE 4X4 12PLY STRL (GAUZE/BANDAGES/DRESSINGS) IMPLANT
GAUZE SPONGE 4X4 16PLY XRAY LF (GAUZE/BANDAGES/DRESSINGS) IMPLANT
GLOVE BIO SURGEON STRL SZ8 (GLOVE) ×2 IMPLANT
GLOVE BIOGEL PI IND STRL 8 (GLOVE) ×1 IMPLANT
GLOVE BIOGEL PI IND STRL 8.5 (GLOVE) ×1 IMPLANT
GLOVE BIOGEL PI INDICATOR 8 (GLOVE) ×1
GLOVE BIOGEL PI INDICATOR 8.5 (GLOVE) ×1
GLOVE ECLIPSE 8.0 STRL XLNG CF (GLOVE) ×4 IMPLANT
GLOVE EXAM NITRILE LRG STRL (GLOVE) IMPLANT
GLOVE EXAM NITRILE MD LF STRL (GLOVE) IMPLANT
GLOVE EXAM NITRILE XL STR (GLOVE) IMPLANT
GLOVE EXAM NITRILE XS STR PU (GLOVE) IMPLANT
GOWN STRL REUS W/ TWL LRG LVL3 (GOWN DISPOSABLE) IMPLANT
GOWN STRL REUS W/ TWL XL LVL3 (GOWN DISPOSABLE) ×1 IMPLANT
GOWN STRL REUS W/TWL 2XL LVL3 (GOWN DISPOSABLE) ×4 IMPLANT
GOWN STRL REUS W/TWL LRG LVL3 (GOWN DISPOSABLE)
GOWN STRL REUS W/TWL XL LVL3 (GOWN DISPOSABLE) ×1
KIT BASIN OR (CUSTOM PROCEDURE TRAY) ×2 IMPLANT
KIT ROOM TURNOVER OR (KITS) ×2 IMPLANT
NEEDLE HYPO 18GX1.5 BLUNT FILL (NEEDLE) IMPLANT
NEEDLE HYPO 25X1 1.5 SAFETY (NEEDLE) ×2 IMPLANT
NS IRRIG 1000ML POUR BTL (IV SOLUTION) ×2 IMPLANT
PACK LAMINECTOMY NEURO (CUSTOM PROCEDURE TRAY) ×2 IMPLANT
PAD ARMBOARD 7.5X6 YLW CONV (MISCELLANEOUS) ×6 IMPLANT
RUBBERBAND STERILE (MISCELLANEOUS) ×4 IMPLANT
SPONGE SURGIFOAM ABS GEL SZ50 (HEMOSTASIS) ×2 IMPLANT
STRIP CLOSURE SKIN 1/2X4 (GAUZE/BANDAGES/DRESSINGS) IMPLANT
SUT ETHILON 3 0 PS 1 (SUTURE) ×2 IMPLANT
SUT VIC AB 0 CT1 18XCR BRD8 (SUTURE) ×1 IMPLANT
SUT VIC AB 0 CT1 8-18 (SUTURE) ×1
SUT VIC AB 2-0 CT1 18 (SUTURE) ×2 IMPLANT
SUT VIC AB 3-0 SH 8-18 (SUTURE) ×2 IMPLANT
SYR 20ML ECCENTRIC (SYRINGE) ×2 IMPLANT
SYR 5ML LL (SYRINGE) IMPLANT
TOWEL OR 17X24 6PK STRL BLUE (TOWEL DISPOSABLE) ×2 IMPLANT
TOWEL OR 17X26 10 PK STRL BLUE (TOWEL DISPOSABLE) ×2 IMPLANT
WATER STERILE IRR 1000ML POUR (IV SOLUTION) ×2 IMPLANT

## 2015-02-10 NOTE — Anesthesia Procedure Notes (Signed)
Procedure Name: Intubation Date/Time: 02/10/2015 2:25 PM Performed by: Julian Reil Pre-anesthesia Checklist: Patient identified, Emergency Drugs available, Suction available and Patient being monitored Patient Re-evaluated:Patient Re-evaluated prior to inductionOxygen Delivery Method: Circle system utilized Preoxygenation: Pre-oxygenation with 100% oxygen Intubation Type: IV induction Ventilation: Mask ventilation without difficulty Laryngoscope Size: Mac and 3 Grade View: Grade I Tube type: Oral Tube size: 7.5 mm Number of attempts: 1 Airway Equipment and Method: Stylet Placement Confirmation: ETT inserted through vocal cords under direct vision,  positive ETCO2 and breath sounds checked- equal and bilateral Secured at: 20 cm Tube secured with: Tape Dental Injury: Teeth and Oropharynx as per pre-operative assessment

## 2015-02-10 NOTE — Progress Notes (Signed)
Patient's leg pain improved.  Leg strength full.  Doing well.

## 2015-02-10 NOTE — Progress Notes (Signed)
Patient appears to be very anxious and BP greater than 200's, Anesthesiologist notified, new orders received. Will continue to monitor.

## 2015-02-10 NOTE — Brief Op Note (Signed)
02/10/2015  4:12 PM  PATIENT:  Sally Carroll  81 y.o. female  PRE-OPERATIVE DIAGNOSIS:  Lumbar herniated disc, Lumbar stenosis, severe lumbar radiculopathy L 45 right, lumbar spondylosis  POST-OPERATIVE DIAGNOSIS:  Lumbar herniated disc, Lumbar stenosis, severe lumbar radiculopathy L 45 right, lumbar spondylosis  PROCEDURE:  Procedure(s) with comments: Right Lumbar Four-Five Microdiskectomy (Right) - Right L4-5 Microdiskectomy with microdissection  SURGEON:  Surgeon(s) and Role:    * Erline Levine, MD - Primary    * Eustace Moore, MD - Assisting  PHYSICIAN ASSISTANT:   ASSISTANTS: [place], RN   ANESTHESIA:   general  EBL:  Total I/O In: 1200 [I.V.:1200] Out: 50 [Blood:50]  BLOOD ADMINISTERED:none  DRAINS: none   LOCAL MEDICATIONS USED:  LIDOCAINE   SPECIMEN:  No Specimen  DISPOSITION OF SPECIMEN:  N/A  COUNTS:  YES  TOURNIQUET:  * No tourniquets in log *  DICTATION: Patient has a large L4-5 disc rupture on the right with significant right leg weakness and pain. It was elected to take her to surgery for right L4-5 microdiscectomy.  Procedure: Patient was brought to the operating room and following the smooth and uncomplicated induction of general endotracheal anesthesia she was placed in a prone position on the Wilson frame. Low back was prepped and draped in the usual sterile fashion with betadine scrub and DuraPrep. Area of planned incision was infiltrated with local lidocaine. Incision was made in the midline and carried to the lumbodorsal fascia which was incised on the right side of midline. Subperiosteal dissection was performed exposing what was felt to be L45 level. Intraoperative x-ray demonstrated marker probes at L4-5 and L 5 S 1 levels. Exposure was carried caudad to expose the L4-5 level. A hemi-semi-laminectomy of L4 was performed a high-speed drill and completed with Kerrison rongeurs and a generous foraminotomy was performed overlying the superior aspect of  the L5 lamina. Ligamentum flavum was detached and removed in a piecemeal fashion and the L5 nerve root was decompressed laterally with removal of the superior aspect of the facet and ligamentum causing nerve root compression. The microscope was brought into the field and the L5 nerve root was mobilized medially. This exposed  soft disc material and a free fragment of herniated disc material. Multiple fragments were removed and these extended into the interspace which appeared to be quite soft with a disrupted annulus overlying the interspace. As a result it was elected to further decompress the interspace and remove loose disc material and this was done with a variety Epstein curettes and pituitary rongeurs. The redundant annulus was also removed with 2 mm Kerrison rongeur. There was evidence on the MRI of cephalad fragments and these were carefully removed and the L4 nerve root was also decompressed. A Dural tear was created along the nerve root sleeve at the take off of the L 4 nerve root.  It was not possible to sew this and CSF leakage stopped with application of gelfoam.  There was no additional leakage of CSF with Valsalva. At this point it was felt that all neural elements were well decompressed and there was no evidence of residual loose disc material within the interspace. Duraseal was applied to the laminotomy defect.  The lumbodorsal fascia was closed with 0 Vicryl sutures the subcutaneous tissues reapproximated 2-0 Vicryl inverted sutures and the skin edges were reapproximated with 3-0 Nylon stitch. The wound is dressed with a sterile occlusive dressing. Patient was extubated in the operating room and taken to recovery in stable and  satisfactory condition having tolerated his operation well counts were correct at the end of the case.  PLAN OF CARE: Admit to inpatient   PATIENT DISPOSITION:  PACU - hemodynamically stable.   Delay start of Pharmacological VTE agent (>24hrs) due to surgical blood loss  or risk of bleeding: yes

## 2015-02-10 NOTE — Progress Notes (Signed)
Pt arrived from PACU to room 4N9. Family at bedside. Pt appears in no distress. Call light with reach. Bed in lowest position. Will continue to  Monitor.   Docia Barrier, RN

## 2015-02-10 NOTE — Transfer of Care (Signed)
Immediate Anesthesia Transfer of Care Note  Patient: Sally Carroll  Procedure(s) Performed: Procedure(s) with comments: Right Lumbar Four-Five Microdiskectomy (Right) - Right L4-5 Microdiskectomy  Patient Location: PACU  Anesthesia Type:General  Level of Consciousness: sedated and responds to stimulation  Airway & Oxygen Therapy: Patient Spontanous Breathing and Patient connected to nasal cannula oxygen  Post-op Assessment: Report given to RN, Post -op Vital signs reviewed and stable and Patient moving all extremities  Post vital signs: Reviewed and stable  Last Vitals:  Filed Vitals:   02/10/15 1615  BP:   Pulse:   Temp: 36.7 C  Resp:     Complications: No apparent anesthesia complications

## 2015-02-10 NOTE — Op Note (Signed)
02/10/2015  4:12 PM  PATIENT:  Sally Carroll  81 y.o. female  PRE-OPERATIVE DIAGNOSIS:  Lumbar herniated disc, Lumbar stenosis, severe lumbar radiculopathy L 45 right, lumbar spondylosis  POST-OPERATIVE DIAGNOSIS:  Lumbar herniated disc, Lumbar stenosis, severe lumbar radiculopathy L 45 right, lumbar spondylosis  PROCEDURE:  Procedure(s) with comments: Right Lumbar Four-Five Microdiskectomy (Right) - Right L4-5 Microdiskectomy with microdissection  SURGEON:  Surgeon(s) and Role:    * Erline Levine, MD - Primary    * Eustace Moore, MD - Assisting  PHYSICIAN ASSISTANT:   ASSISTANTS: Poteat, RN   ANESTHESIA:   general  EBL:  Total I/O In: 1200 [I.V.:1200] Out: 50 [Blood:50]  BLOOD ADMINISTERED:none  DRAINS: none   LOCAL MEDICATIONS USED:  LIDOCAINE   SPECIMEN:  No Specimen  DISPOSITION OF SPECIMEN:  N/A  COUNTS:  YES  TOURNIQUET:  * No tourniquets in log *  DICTATION: Patient has a large L4-5 disc rupture on the right with significant right leg weakness and pain. It was elected to take her to surgery for right L4-5 microdiscectomy.  Procedure: Patient was brought to the operating room and following the smooth and uncomplicated induction of general endotracheal anesthesia she was placed in a prone position on the Wilson frame. Low back was prepped and draped in the usual sterile fashion with betadine scrub and DuraPrep. Area of planned incision was infiltrated with local lidocaine. Incision was made in the midline and carried to the lumbodorsal fascia which was incised on the right side of midline. Subperiosteal dissection was performed exposing what was felt to be L45 level. Intraoperative x-ray demonstrated marker probes at L4-5 and L 5 S 1 levels. Exposure was carried caudad to expose the L4-5 level. A hemi-semi-laminectomy of L4 was performed a high-speed drill and completed with Kerrison rongeurs and a generous foraminotomy was performed overlying the superior aspect of  the L5 lamina. Ligamentum flavum was detached and removed in a piecemeal fashion and the L5 nerve root was decompressed laterally with removal of the superior aspect of the facet and ligamentum causing nerve root compression. The microscope was brought into the field and the L5 nerve root was mobilized medially. This exposed  soft disc material and a free fragment of herniated disc material. Multiple fragments were removed and these extended into the interspace which appeared to be quite soft with a disrupted annulus overlying the interspace. As a result it was elected to further decompress the interspace and remove loose disc material and this was done with a variety Epstein curettes and pituitary rongeurs. The redundant annulus was also removed with 2 mm Kerrison rongeur. There was evidence on the MRI of cephalad fragments and these were carefully removed and the L4 nerve root was also decompressed. A Dural tear was created along the nerve root sleeve at the take off of the L 4 nerve root.  It was not possible to sew this and CSF leakage stopped with application of gelfoam.  There was no additional leakage of CSF with Valsalva. At this point it was felt that all neural elements were well decompressed and there was no evidence of residual loose disc material within the interspace. Duraseal was applied to the laminotomy defect.  The lumbodorsal fascia was closed with 0 Vicryl sutures the subcutaneous tissues reapproximated 2-0 Vicryl inverted sutures and the skin edges were reapproximated with 3-0 Nylon stitch. The wound is dressed with a sterile occlusive dressing. Patient was extubated in the operating room and taken to recovery in stable and  satisfactory condition having tolerated his operation well counts were correct at the end of the case.  PLAN OF CARE: Admit to inpatient   PATIENT DISPOSITION:  PACU - hemodynamically stable.   Delay start of Pharmacological VTE agent (>24hrs) due to surgical blood loss  or risk of bleeding: yes

## 2015-02-10 NOTE — Anesthesia Preprocedure Evaluation (Addendum)
Anesthesia Evaluation  Patient identified by MRN, date of birth, ID band Patient awake    Reviewed: Allergy & Precautions, NPO status , Patient's Chart, lab work & pertinent test results, reviewed documented beta blocker date and time   History of Anesthesia Complications Negative for: history of anesthetic complications  Airway Mallampati: II  TM Distance: >3 FB Neck ROM: Full    Dental  (+) Teeth Intact   Pulmonary neg pulmonary ROS,  breath sounds clear to auscultation        Cardiovascular hypertension, Pt. on medications and Pt. on home beta blockers - angina- Past MI and - CHF + dysrhythmias Atrial Fibrillation + pacemaker + Valvular Problems/Murmurs MR Rhythm:Regular  Hx/o A. Fib Hx/o Complete heart block S/P PPM   Neuro/Psych PSYCHIATRIC DISORDERS Anxiety Depression negative neurological ROS     GI/Hepatic GERD-  Medicated and Controlled,  Endo/Other  negative endocrine ROSHyperlipidemia  Renal/GU Renal disease  negative genitourinary   Musculoskeletal  (+) Arthritis -, HNP L4-5   Abdominal   Peds  Hematology  (+) anemia , Anticoagulated on Coumadin- last dose   Anesthesia Other Findings   Reproductive/Obstetrics                            Anesthesia Physical Anesthesia Plan  ASA: III  Anesthesia Plan: General   Post-op Pain Management:    Induction: Intravenous  Airway Management Planned: Oral ETT  Additional Equipment: None  Intra-op Plan:   Post-operative Plan: Extubation in OR  Informed Consent: I have reviewed the patients History and Physical, chart, labs and discussed the procedure including the risks, benefits and alternatives for the proposed anesthesia with the patient or authorized representative who has indicated his/her understanding and acceptance.     Plan Discussed with: Anesthesiologist, CRNA and Surgeon  Anesthesia Plan Comments:         Anesthesia Quick Evaluation

## 2015-02-10 NOTE — Anesthesia Postprocedure Evaluation (Signed)
  Anesthesia Post-op Note  Patient: Sally Carroll  Procedure(s) Performed: Procedure(s) with comments: Right Lumbar Four-Five Microdiskectomy (Right) - Right L4-5 Microdiskectomy  Patient Location: PACU  Anesthesia Type: General   Level of Consciousness: awake, alert  and oriented  Airway and Oxygen Therapy: Patient Spontanous Breathing  Post-op Pain: mild  Post-op Assessment: Post-op Vital signs reviewed  Post-op Vital Signs: Reviewed  Last Vitals:  Filed Vitals:   02/10/15 1615  BP: 171/80  Pulse: 71  Temp: 36.7 C  Resp: 9    Complications: No apparent anesthesia complications

## 2015-02-10 NOTE — Progress Notes (Signed)
Notified [employer] representative and referred to anesthesiologist for further orders.

## 2015-02-10 NOTE — Interval H&P Note (Signed)
History and Physical Interval Note:  02/10/2015 7:26 AM  Sally Carroll  has presented today for surgery, with the diagnosis of Lumbar herniated disc, Lumbar stenosis  The various methods of treatment have been discussed with the patient and family. After consideration of risks, benefits and other options for treatment, the patient has consented to  Procedure(s) with comments: Right L4-5 Microdiskectomy (Right) - Right L4-5 Microdiskectomy as a surgical intervention .  The patient's history has been reviewed, patient examined, no change in status, stable for surgery.  I have reviewed the patient's chart and labs.  Questions were answered to the patient's satisfaction.     Bethany Cumming D

## 2015-02-11 MED ORDER — ENOXAPARIN SODIUM 40 MG/0.4ML ~~LOC~~ SOLN
40.0000 mg | Freq: Every day | SUBCUTANEOUS | Status: DC
  Administered 2015-02-11 – 2015-02-15 (×5): 40 mg via SUBCUTANEOUS
  Filled 2015-02-11 (×5): qty 0.4

## 2015-02-11 NOTE — Progress Notes (Signed)
Patient ID: Sally Carroll, female   DOB: 11/08/1933, 81 y.o.   MRN: [id number] Crying because of pain and being unable to get oob. Spoke with her about the need to stay flat to prevent headache. Moves both legs.

## 2015-02-11 NOTE — Progress Notes (Signed)
OT Cancellation Note  Patient Details Name: Sally Carroll MRN: [id number] DOB: 1934/06/03   Cancelled Treatment:    Reason Eval/Treat Not Completed: Patient not medically ready Per PT: Noted in today's progress note that pt will "need to stay flat", however no orders written about flat bedrest or duration of bedrest. Spoke with RN who indicates she has been told pt is on bedrest until Monday. Please update activity orders as appropriate. Will f/u Monday for OT eval.  Villa Herb M   Cyndie Chime, OTR/L Occupational Therapist [phone] (pager)  02/11/2015, 10:42 AM

## 2015-02-11 NOTE — Progress Notes (Signed)
PT Cancellation Note  Patient Details Name: Sally Carroll MRN: [id number] DOB: [birth year]   Cancelled Treatment:    Reason Eval/Treat Not Completed: Patient not medically ready.  Noted in today's progress note that pt will "need to stay flat", however no orders written about flat bedrest or duration of bedrest.  Spoke with RN who indicates she has been told pt is on bedrest until Monday.  Please update activity orders as appropriate.  Will f/u Monday for PT eval.     Yaneli Keithley F 02/11/2015, 10:17 AM

## 2015-02-12 MED ORDER — PANTOPRAZOLE SODIUM 40 MG PO TBEC
40.0000 mg | DELAYED_RELEASE_TABLET | Freq: Every day | ORAL | Status: DC
  Administered 2015-02-12 – 2015-02-15 (×4): 40 mg via ORAL
  Filled 2015-02-12 (×4): qty 1

## 2015-02-12 NOTE — Progress Notes (Signed)
Patient refused her breakfast, but ate one ice cream and an ensure with encouragement.

## 2015-02-12 NOTE — Progress Notes (Signed)
Patient doing better today. Less hip and leg pain. She is calm. No headache. Moving legs okay. Continue flat bed rest for today.

## 2015-02-13 ENCOUNTER — Encounter (HOSPITAL_COMMUNITY): Payer: Self-pay | Admitting: General Practice

## 2015-02-13 MED ORDER — CARBIDOPA-LEVODOPA 25-100 MG PO TABS: 1.0000 | ORAL_TABLET | Freq: Every day | ORAL | Status: DC

## 2015-02-13 MED ORDER — CARBIDOPA-LEVODOPA 25-100 MG PO TABS
1.0000 | ORAL_TABLET | Freq: Every day | ORAL | Status: DC
  Administered 2015-02-13 – 2015-02-15 (×3): 1 via ORAL
  Filled 2015-02-13 (×3): qty 1

## 2015-02-13 MED ORDER — CARBIDOPA-LEVODOPA 25-100 MG PO TABS
2.0000 | ORAL_TABLET | Freq: Every day | ORAL | Status: DC
  Administered 2015-02-13 – 2015-02-15 (×3): 2 via ORAL
  Filled 2015-02-13 (×3): qty 2

## 2015-02-13 NOTE — Progress Notes (Signed)
PT Cancellation Note  Patient Details Name: Sally Carroll MRN: [id number] DOB: 1933-10-20   Cancelled Treatment:    Reason Eval/Treat Not Completed: Patient not medically ready.  Will attempt OOB for PT eval tomorrow as pt's HOB has only been elevated to 20* as of 1345.  PT elevated to 30*.  RN, pt , and family educated about need to gradually increase HOB.     Anis Degidio, Thornton Papas 02/13/2015, 2:06 PM

## 2015-02-13 NOTE — Progress Notes (Signed)
Patient was gradually brought to a 90 degree sitting position in bed, move patient to chair she tolerated the movement well but required moderate assistance. Will continue to monitor.

## 2015-02-13 NOTE — Progress Notes (Signed)
Patient can not feed herself according to family member so please help her at meal time so she is feed.

## 2015-02-13 NOTE — Progress Notes (Signed)
Subjective: Patient reports "I'm not hurting"  Objective: Vital signs in last 24 hours: Temp:  [98.2 F (36.8 C)-100.5 F (38.1 C)] 98.5 F (36.9 C) (06/13 0509) Pulse Rate:  [68-72] 72 (06/13 0509) Resp:  [18-20] 18 (06/13 0509) BP: (105-174)/(53-73) 166/73 mmHg (06/13 0509) SpO2:  [95 %-100 %] 97 % (06/13 0509)  Intake/Output from previous day: 06/12 0701 - 06/13 0700 In: -  Out: 200 [Urine:200] Intake/Output this shift:    Opens eyes to touch. Follows commands all extremities, but lethargic. Speaks quietly & slowly only in respose to questions from nurse or family. Denies pain at present, but apparently c/o back pain earlier & rec'd dilaudid. Incision without erythema, swelling, or drainage. Honeycomb drsg intact.   Lab Results: No results for input(s): WBC, HGB, HCT, PLT in the last 72 hours. BMET No results for input(s): NA, K, CL, CO2, GLUCOSE, BUN, CREATININE, CALCIUM in the last 72 hours.  Studies/Results: No results found.  Assessment/Plan: No evidence of CSF leak.   LOS: 3 days  Per DrStern, elevate HOB gradually through the morning, hopefully up to chair this afternoon, mobilizing as tolerated. Resume PT/OT for gentle progression. Monitor for s/s CSF leak including positional h/a & swelling at incision. Will encourage only po pain meds today & see how the pain responds.   Verdis Prime 02/13/2015, 8:28 AM    Patient improving.  Mobilize today.

## 2015-02-13 NOTE — Progress Notes (Signed)
OT Cancellation Note  Patient Details Name: Sally Carroll MRN: [id number] DOB: [birth year]   Cancelled Treatment:    Reason Eval/Treat Not Completed: Medical issues which prohibited therapy (BEDReST )  Peri Maris  Pager: [phone]  02/13/2015, 7:19 AM

## 2015-02-13 NOTE — Progress Notes (Signed)
PT Cancellation Note  Patient Details Name: ERMINA OBERMAN MRN: [id number] DOB: 14-Oct-1933   Cancelled Treatment:    Reason Eval/Treat Not Completed: Patient not medically ready.  Continue to await orders stating if pt will continue to be on bedrest or if she will be allowed activity today.  Please write activity orders desired.     Elgene Coral, Thornton Papas 02/13/2015, 8:10 AM

## 2015-02-13 NOTE — Clinical Social Work Note (Signed)
CSW Consult Acknowledged:   CSW received a consult for SNF placement. CSW awaiting PT/OT evaluation to determine the appropriate level of care. Pt order currently reflect bedrest. CSW spoke with bedside RN regarding bedrest order.   [place], MSW, [place]

## 2015-02-14 ENCOUNTER — Other Ambulatory Visit: Payer: PRIVATE HEALTH INSURANCE

## 2015-02-14 NOTE — Evaluation (Signed)
Occupational Therapy Evaluation Patient Details Name: MONITA SWIER MRN: [id number] DOB: 1934-04-17 Today's Date: 02/14/2015    History of Present Illness 81 y.o. female s/p L4-5 right microdiskectomy; dural tear with CFS leakage. PHM: pacemaker (1999), arthritis, hypertension, anxiety   Clinical Impression   Patient is s/p L4-5 right microdiskectomy surgery resulting in functional limitations due to the deficits listed below (see OT problem list). Pt very confused this morning, and requiring constant orientation to place, time and situation. Pt able to follow direct, one step commands only. Pt requires max A with ADLs at this time. Education and handout provided to family on back precautions. Patient will benefit from skilled OT acutely to increase independence and safety with ADLS to allow discharge to SNF.     Follow Up Recommendations  SNF    Equipment Recommendations       Recommendations for Other Services       Precautions / Restrictions Precautions Precautions: Back Precaution Booklet Issued: Yes (comment) Precaution Comments: handout given to family Restrictions Weight Bearing Restrictions: No      Mobility Bed Mobility Overal bed mobility: Needs Assistance Bed Mobility: Rolling;Sidelying to Sit Rolling: Mod assist Sidelying to sit: Mod assist       General bed mobility comments: cuing for safety and sequencing  Transfers Overall transfer level: Needs assistance Equipment used: Rolling walker (2 wheeled) Transfers: Sit to/from Stand Sit to Stand: Max assist;+2 physical assistance;From elevated surface         General transfer comment: Cuing for hand placement; cuing to maintain standing    Balance Overall balance assessment: Needs assistance Sitting-balance support: Feet supported;Bilateral upper extremity supported       Standing balance support: Bilateral upper extremity supported                                ADL Overall  ADL's : Needs assistance/impaired Eating/Feeding: Set up;Sitting   Grooming: Wash/dry hands;Wash/dry face;Minimal assistance;Cueing for sequencing;Sitting (direct 1 step command )   Upper Body Bathing: Moderate assistance;Sitting;Cueing for sequencing   Lower Body Bathing: Moderate assistance;Cueing for sequencing;Sit to/from stand;Cueing for back precautions   Upper Body Dressing : Minimal assistance;Cueing for sequencing;Sitting   Lower Body Dressing: Moderate assistance;Cueing for sequencing;Cueing for back precautions;Sit to/from stand   Toilet Transfer: +2 for physical assistance;Ambulation;BSC;RW;Moderate assistance   Toileting- Clothing Manipulation and Hygiene: Sit to/from stand;Moderate assistance       Functional mobility during ADLs: Moderate assistance;+2 for physical assistance;Rolling walker General ADL Comments: Pt very confused, able to follow direct one step commands only this morning.      Vision Vision Assessment?: No apparent visual deficits   Perception     Praxis      Pertinent Vitals/Pain Pain Assessment: Faces Faces Pain Scale: Hurts even more Pain Location: R leg Pain Descriptors / Indicators: Grimacing;Aching Pain Intervention(s): Monitored during session;Repositioned     Hand Dominance Right   Extremity/Trunk Assessment Upper Extremity Assessment Upper Extremity Assessment: Generalized weakness   Lower Extremity Assessment Lower Extremity Assessment: Defer to PT evaluation       Communication Communication Communication: No difficulties   Cognition Arousal/Alertness: Awake/alert Behavior During Therapy: Flat affect Overall Cognitive Status: Impaired/Different from baseline Area of Impairment: Orientation;Safety/judgement Orientation Level: Disoriented to;Place;Time;Situation   Memory: Decreased recall of precautions   Safety/Judgement: Decreased awareness of safety;Decreased awareness of deficits     General Comments: Pt  required constant orientation to place and situation; pt repeatedly asking "  who are you and why are you here?"   General Comments       Exercises       Shoulder Instructions      Home Living Family/patient expects to be discharged to:: Skilled nursing facility Living Arrangements: Alone                                      Prior Functioning/Environment Level of Independence: Needs assistance        Comments: unable to fully assess due to Pt being confused    OT Diagnosis: Generalized weakness;Cognitive deficits   OT Problem List: Decreased strength;Decreased activity tolerance;Impaired balance (sitting and/or standing);Decreased cognition;Decreased safety awareness;Decreased knowledge of use of DME or AE;Decreased knowledge of precautions;Pain   OT Treatment/Interventions: Self-care/ADL training;Therapeutic exercise;DME and/or AE instruction;Therapeutic activities;Patient/family education;Balance training    OT Goals(Current goals can be found in the care plan section) Acute Rehab OT Goals Patient Stated Goal: none stated Time For Goal Achievement: 02/28/15 ADL Goals Pt Will Perform Grooming: with min assist;sitting Pt Will Perform Upper Body Dressing: sitting;with min guard assist Pt Will Transfer to Toilet: ambulating;bedside commode;with mod assist Additional ADL Goal #1: Pt will verbalize 3/3 precautions as precursor to ADLs  OT Frequency: Min 2X/week   Barriers to D/C:            Co-evaluation              End of Session Equipment Utilized During Treatment: Gait belt;Rolling walker Nurse Communication: Mobility status;Precautions  Activity Tolerance: Patient limited by lethargy Patient left: in chair;with call bell/phone within reach;with chair alarm set;with family/visitor present   Time: 0720-0748 OT Time Calculation (min): 28 min Charges:  OT General Charges $OT Visit: 1 Procedure OT Evaluation $Initial OT Evaluation Tier I: 1  Procedure OT Treatments $Self Care/Home Management : 8-22 mins G-Codes:    Jenavive Lamboy 02/14/2015, 9:37 AM

## 2015-02-14 NOTE — Clinical Social Work Placement (Signed)
   CLINICAL SOCIAL WORK PLACEMENT  NOTE  Date:  02/14/2015  Patient Details  Name: Sally Carroll MRN: [id number] Date of Birth: Aug 09, 1934  Clinical Social Work is seeking post-discharge placement for this patient at the [place] level of care (*CSW will initial, date and re-position this form in  chart as items are completed):  Yes   Patient/family provided with [facility] Work Department's list of facilities offering this level of care within the geographic area requested by the patient (or if unable, by the patient's family).  Yes   Patient/family informed of their freedom to choose among providers that offer the needed level of care, that participate in Medicare, Medicaid or managed care program needed by the patient, have an available bed and are willing to accept the patient.  Yes   Patient/family informed of [facility]'s ownership interest in [facility] and [facility], as well as of the fact that they are under no obligation to receive care at these facilities.  PASRR submitted to EDS on 02/14/15     PASRR number received on 02/14/15     Existing PASRR number confirmed on       FL2 transmitted to all facilities in geographic area requested by pt/family on 02/14/15     FL2 transmitted to all facilities within larger geographic area on       Patient informed that his/her managed care company has contracts with or will negotiate with certain facilities, including the following:            Patient/family informed of bed offers received.  Patient chooses bed at       Physician recommends and patient chooses bed at      Patient to be transferred to   on  .  Patient to be transferred to facility by       Patient family notified on   of transfer.  Name of family member notified:        PHYSICIAN       Additional Comment:    _______________________________________________ Greta Doom, LCSW 02/14/2015, 2:05 PM

## 2015-02-14 NOTE — Clinical Social Work Note (Signed)
Clinical Social Work Assessment  Patient Details  Name: Sally Carroll MRN: [id number] Date of Birth: 03-18-1934  Date of referral:  02/14/15               Reason for consult:  Facility Placement                Housing/Transportation Living arrangements for the past 2 months:  Single Family Home Source of Information:   [place] Roney,[name], other family ) Patient Interpreter Needed:  None Criminal Activity/Legal Involvement Pertinent to Current Situation/Hospitalization:  No - Comment as needed Significant Relationships:  Other Family Members Lives with:  Relatives Do you feel safe going back to the place where you live?  No Need for family participation in patient care:  No (Coment)  Care giving concerns:  N/A   Facilities manager / plan:  CSW spoke with the pt's relative [employer]. CSW introduced self and purpose of the call. Valma Cava reported that the pt's nephew is Jake Church. Valma Cava confirmed that Jenny Reichmann works at [employer]. CSW discussed SNF rehab. CSW explained the SNF process. CSW explained insurance and its relation to SNF placement. CSW discussed geographic location in which the pt would like to receive rehab. Valma Cava reported the family reported would like the pt to go to [facility]. CSW answered all questions in which the Gamaliel inquired about. CSW will continue to follow this pt and assist with discharge as needed.   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   Patient/Family's Understanding of and Emotional Response to Diagnosis, Current Treatment, and Prognosis: Valma Cava acknowledged the pt's diagnosis. Roney expressed feelings of content regarding the pt surgery. Valma Cava shared the family want the pt to get  well.    Emotional Assessment Appearance:  Other (Comment Required (Unable to Assess ) Attitude/Demeanor/Rapport:  Unable to Assess Affect (typically observed):  Unable to Assess Orientation:  Oriented to Self Alcohol / Substance use:  Not Applicable Psych involvement (Current and /or in the community):  No (Comment)  Discharge Needs  Concerns to be addressed:  Denies Needs/Concerns at this time Readmission within the last 30 days:  No Current discharge risk:  None Barriers to Discharge:  No Barriers Identified   Lander Eslick, LCSW 02/14/2015, 1:45 PM

## 2015-02-14 NOTE — Evaluation (Signed)
Physical Therapy Evaluation Patient Details Name: Sally Carroll MRN: [id number] DOB: 02-14-1934 Today's Date: 02/14/2015   History of Present Illness  81 y.o. female s/p L4-5 right microdiskectomy; dural tear with CFS leakage. PHM: pacemaker (1999), arthritis, hypertension, anxiety  Clinical Impression  Pt with both mobility and cognitive impairments limiting pt ability to return home alone. Pt to benefit from ST-SNF to achieve safe mod I level of function for safe transition back home.    Follow Up Recommendations SNF;Supervision/Assistance - 24 hour    Equipment Recommendations  Rolling walker with 5" wheels    Recommendations for Other Services       Precautions / Restrictions Precautions Precautions: Back Precaution Booklet Issued: Yes (comment) Precaution Comments: handout given to , went over precautions with patient again as pt with no recall Restrictions Weight Bearing Restrictions: No      Mobility  Bed Mobility Overal bed mobility: Needs Assistance Bed Mobility: Rolling;Sidelying to Sit Rolling: Mod assist Sidelying to sit: Max assist       General bed mobility comments: assist for trunk elevation, tactile cues to complete task  Transfers Overall transfer level: Needs assistance Equipment used: Rolling walker (2 wheeled) Transfers: Sit to/from Stand Sit to Stand: Max assist;From elevated surface;+2 safety/equipment         General transfer comment: Cuing for hand placement; cuing to maintain standing  Ambulation/Gait Ambulation/Gait assistance: Mod assist;+2 safety/equipment (chair follow) Ambulation Distance (Feet): 50 Feet Assistive device: Rolling walker (2 wheeled) Gait Pattern/deviations: Step-through pattern;Decreased stride length;Narrow base of support Gait velocity: slow Gait velocity interpretation: <1.8 ft/sec, indicative of risk for recurrent falls General Gait Details: max tactile cues to achieve full upright posture and look forward  due to excessive trunk flexion  Stairs            Wheelchair Mobility    Modified Rankin (Stroke Patients Only)       Balance Overall balance assessment: Needs assistance Sitting-balance support: Feet supported;Bilateral upper extremity supported Sitting balance-Leahy Scale: Poor     Standing balance support: Bilateral upper extremity supported Standing balance-Leahy Scale: Poor                               Pertinent Vitals/Pain Pain Assessment: Faces Faces Pain Scale: Hurts even more Pain Location: R leg posterior Pain Descriptors / Indicators: Grimacing Pain Intervention(s): Monitored during session    Home Living Family/patient expects to be discharged to:: Skilled nursing facility Living Arrangements: Alone               Additional Comments: no family present to ask PLOF, pt poor historian    Prior Function Level of Independence: Needs assistance         Comments: unable to fully assess due to Pt being confused     Hand Dominance   Dominant Hand: Right    Extremity/Trunk Assessment   Upper Extremity Assessment: Generalized weakness           Lower Extremity Assessment: Generalized weakness      Cervical / Trunk Assessment:  (recent back surgery)  Communication   Communication: No difficulties  Cognition Arousal/Alertness: Awake/alert Behavior During Therapy: Flat affect Overall Cognitive Status: Impaired/Different from baseline Area of Impairment: Orientation;Safety/judgement Orientation Level: Disoriented to;Place;Time;Situation   Memory: Decreased recall of precautions   Safety/Judgement: Decreased awareness of safety;Decreased awareness of deficits     General Comments: Pt required constant orientation to place and situation; pt repeatedly asking "who  are you and why are you here?"    General Comments General comments (skin integrity, edema, etc.): pt became emotional over husbanc passing last june     Exercises        Assessment/Plan    PT Assessment Patient needs continued PT services  PT Diagnosis Difficulty walking;Acute pain   PT Problem List Decreased strength;Decreased range of motion;Decreased activity tolerance;Decreased balance;Decreased mobility  PT Treatment Interventions DME instruction;Gait training;Stair training;Functional mobility training;Therapeutic activities;Therapeutic exercise;Balance training   PT Goals (Current goals can be found in the Care Plan section) Acute Rehab PT Goals Patient Stated Goal: none stated PT Goal Formulation: With patient Time For Goal Achievement: 02/21/15 Potential to Achieve Goals: Good    Frequency Min 5X/week   Barriers to discharge Decreased caregiver support alone    Co-evaluation               End of Session Equipment Utilized During Treatment: Gait belt Activity Tolerance: Patient limited by pain Patient left: in chair;with call bell/phone within reach;with chair alarm set Nurse Communication: Mobility status (pt emotional/tearful)         Time: 7711-6579 PT Time Calculation (min) (ACUTE ONLY): 23 min   Charges:   PT Evaluation $Initial PT Evaluation Tier I: 1 Procedure PT Treatments $Gait Training: 8-22 mins   PT G CodesKingsley Callander 02/14/2015, 12:40 PM  Kittie Plater, PT, DPT Pager #: [phone] Office #: [phone]

## 2015-02-14 NOTE — Progress Notes (Signed)
Subjective: Patient reports "I don't hurt"  Objective: Vital signs in last 24 hours: Temp:  [97.5 F (36.4 C)-100.3 F (37.9 C)] 98.5 F (36.9 C) (06/14 0556) Pulse Rate:  [70-100] 70 (06/14 0556) Resp:  [18] 18 (06/14 0556) BP: (97-156)/(61-74) 130/61 mmHg (06/14 0556) SpO2:  [94 %-99 %] 99 % (06/14 0556)  Intake/Output from previous day: 06/13 0701 - 06/14 0700 In: 3 [I.V.:3] Out: 900 [Urine:900] Intake/Output this shift:    Awakens to touch, answers questions slowly. Moves extremioties to command & attempts to sit up in bed without assistance. Incision flat with minimal old bloody drainage, no swelling.   Lab Results: No results for input(s): WBC, HGB, HCT, PLT in the last 72 hours. BMET No results for input(s): NA, K, CL, CO2, GLUCOSE, BUN, CREATININE, CALCIUM in the last 72 hours.  Studies/Results: No results found.  Assessment/Plan: Improving   LOS: 4 days  Mobilize today with PT/OT. Plan for SNF.    Verdis Prime 02/14/2015, 7:24 AM

## 2015-02-15 NOTE — Progress Notes (Signed)
Subjective: Patient reports intermittant right leg pain  Objective: Vital signs in last 24 hours: Temp:  [98 F (36.7 C)-98.6 F (37 C)] 98.4 F (36.9 C) (06/15 0540) Pulse Rate:  [69-74] 69 (06/15 0540) Resp:  [16-20] 19 (06/15 0540) BP: (112-162)/(51-77) 151/72 mmHg (06/15 0540) SpO2:  [97 %-100 %] 99 % (06/15 0540)  Intake/Output from previous day: 06/14 0701 - 06/15 0700 In: -  Out: 775 [Urine:775] Intake/Output this shift: Total I/O In: 240 [P.O.:240] Out: -   Physical Exam: Dressing CDI.  No HA.  Strength full.    Lab Results: No results for input(s): WBC, HGB, HCT, PLT in the last 72 hours. BMET No results for input(s): NA, K, CL, CO2, GLUCOSE, BUN, CREATININE, CALCIUM in the last 72 hours.  Studies/Results: No results found.  Assessment/Plan: Mobilize with PT.  Transfer to SNF today if does well with PT.    LOS: 5 days    Peggyann Shoals, MD 02/15/2015, 8:49 AM

## 2015-02-15 NOTE — Progress Notes (Signed)
Physical Therapy Treatment Patient Details Name: Sally Carroll MRN: [id number] DOB: 1934/02/08 Today's Date: 02/15/2015    History of Present Illness 81 y.o. female s/p L4-5 right microdiskectomy; dural tear with CFS leakage. PHM: pacemaker (1999), arthritis, hypertension, anxiety    PT Comments    Pt with improved ambulation tolerance this date however con't to have c/o R LE pain especially with mvmt. Pt con't to have cognitive impairments and extremely poor recall of precautions. Pt con't to require max v/c's to adhere to precautions during mobility. Con't to recommend SNF upon d/c to address deficits and achieve safe mod I level of function.   Follow Up Recommendations  SNF;Supervision/Assistance - 24 hour     Equipment Recommendations  Rolling walker with 5" wheels    Recommendations for Other Services       Precautions / Restrictions Precautions Precautions: Back Precaution Booklet Issued: Yes (comment) Precaution Comments: pt with no recall, re-educated pt on precautions in presence of daughter Restrictions Weight Bearing Restrictions: No    Mobility  Bed Mobility Overal bed mobility: Needs Assistance Bed Mobility: Rolling;Sidelying to Sit Rolling: Min assist Sidelying to sit: Mod assist       General bed mobility comments: pt able to initiat rolling this date but con't to require assist for trunk elevation  Transfers Overall transfer level: Needs assistance Equipment used: Rolling walker (2 wheeled) Transfers: Sit to/from Stand Sit to Stand: Min assist         General transfer comment: v/c's for hand placement, increased time, decreased R LE WBing  Ambulation/Gait Ambulation/Gait assistance: Min assist Ambulation Distance (Feet): 150 Feet Assistive device: Rolling walker (2 wheeled) Gait Pattern/deviations: Decreased stride length;Narrow base of support;Trunk flexed Gait velocity: slow Gait velocity interpretation: Below normal speed for  age/gender General Gait Details: pt with improved ability to look forward/ahead.con't to require tactile cues to achieve full upright position, max v/c's to adhere to precautions. minA for walker management especially around obstacles and turning   Stairs            Wheelchair Mobility    Modified Rankin (Stroke Patients Only)       Balance           Standing balance support: Bilateral upper extremity supported Standing balance-Leahy Scale: Poor                      Cognition Arousal/Alertness: Awake/alert Behavior During Therapy: Flat affect Overall Cognitive Status: Impaired/Different from baseline Area of Impairment: Orientation;Safety/judgement Orientation Level: Disoriented to;Place;Time;Situation   Memory: Decreased recall of precautions   Safety/Judgement: Decreased awareness of safety;Decreased awareness of deficits     General Comments: pt with extremely poor memory and no recall of back precautions even 5 min after eduation    Exercises      General Comments        Pertinent Vitals/Pain Pain Assessment: Faces Faces Pain Scale: Hurts even more Pain Location: R leg posterior Pain Descriptors / Indicators: Grimacing Pain Intervention(s): Monitored during session    Home Living                      Prior Function            PT Goals (current goals can now be found in the care plan section) Acute Rehab PT Goals Patient Stated Goal: none stated Progress towards PT goals: Progressing toward goals    Frequency  Min 5X/week    PT Plan Current plan remains appropriate  Co-evaluation             End of Session Equipment Utilized During Treatment: Gait belt Activity Tolerance: Patient limited by pain Patient left: in chair;with call bell/phone within reach;with chair alarm set;with family/visitor present     Time: 1282-0813 PT Time Calculation (min) (ACUTE ONLY): 23 min  Charges:  $Gait Training: 8-22  mins $Therapeutic Activity: 8-22 mins                    G Codes:      Kingsley Callander 02/15/2015, 10:13 AM   Kittie Plater, PT, DPT Pager #: [phone] Office #: [phone]

## 2015-02-15 NOTE — Discharge Summary (Signed)
Physician Discharge Summary  Patient ID: Sally Carroll MRN: [id number] DOB/AGE: [birth year] 81 y.o.  Admit date: 02/10/2015 Discharge date: 02/15/2015  Admission Diagnoses: Lumbar herniated disc, Lumbar stenosis, severe lumbar radiculopathy L 45 right, lumbar spondylosis   Discharge Diagnoses: Lumbar herniated disc, Lumbar stenosis, severe lumbar radiculopathy L 45 right, lumbar spondylosis  s/p Right Lumbar Four-Five Microdiskectomy (Right) - Right L4-5 Microdiskectomy with microdissection  Active Problems:   Herniated lumbar intervertebral disc   Discharged Condition: good  Hospital Course: Kariel Skillman was admitted for surgery with dx HNP, radiculopathy. Following successful decompression of HNP stasis of CSF leak from dural tear, she recovered well. Following flat bedrest over weekend, she mobilized slowly.   Consults: None  Significant Diagnostic Studies: radiology: X-Ray: intra-operative  Treatments: surgery: Right Lumbar Four-Five Microdiskectomy (Right) - Right L4-5 Microdiskectomy with microdissection   Discharge Exam: Blood pressure 132/67, pulse 71, temperature 98 F (36.7 C), temperature source Oral, resp. rate 18, height 5' 5.5" (1.664 m), weight 54.341 kg (119 lb 12.8 oz), SpO2 100 %. Alert, conversant, and cooperative this afternoon. Mobilizing with walker & sba. Incision with honeycomb over dermabond. No erythema, swelling, or drainage. Good strength BLE. Minimal lumbar pain. Currently awaiting insurance approval at [employer].  Plan to d/c to snf when insurance approves. Pt & family agree to plan.    Disposition: Discharge to SNF. Follow up in office with DrStern July 6th.     Medication List    ASK your doctor about these medications        ALPRAZolam 0.25 MG tablet  Commonly known as:  XANAX  Take 0.25 mg by mouth at bedtime.     Biotin 2500 MCG Caps  Take 2,500 mcg by mouth 2 (two) times daily.     carbidopa-levodopa 25-100 MG per tablet   Commonly known as:  SINEMET IR  Take 1-2 tablets by mouth 3 (three) times daily. Takes 1 tablet at 1800 and 2 tablets at bedtime. May take an additionall tablet if needed     carvedilol 6.25 MG tablet  Commonly known as:  COREG  Take 3.125 mg by mouth 2 (two) times daily with a meal.     cholecalciferol 1000 UNITS tablet  Commonly known as:  VITAMIN D  Take 1,000 Units by mouth 2 (two) times daily.     citalopram 10 MG tablet  Commonly known as:  CELEXA  Take 10 mg by mouth daily.     ferrous sulfate 325 (65 FE) MG tablet  Take 325 mg by mouth daily with breakfast.     gabapentin 100 MG capsule  Commonly known as:  NEURONTIN  Take 200 mg by mouth 3 (three) times daily.     HYDROcodone-acetaminophen 10-325 MG per tablet  Commonly known as:  NORCO  Take 1 tablet by mouth 3 (three) times daily.     losartan 100 MG tablet  Commonly known as:  COZAAR  Take 100 mg by mouth daily.     multivitamin with minerals tablet  Take 1 tablet by mouth daily.     PRESERVISION AREDS Caps  Take 1 capsule by mouth 2 (two) times daily.     omeprazole 20 MG capsule  Commonly known as:  PRILOSEC  Take 20 mg by mouth daily.     sennosides-docusate sodium 8.6-50 MG tablet  Commonly known as:  SENOKOT-S  Take 1 tablet by mouth daily.     simvastatin 20 MG tablet  Commonly known as:  ZOCOR  Take 10 mg  by mouth daily.     traZODone 100 MG tablet  Commonly known as:  DESYREL  Take 100 mg by mouth at bedtime.     warfarin 5 MG tablet  Commonly known as:  COUMADIN  Take 5 mg by mouth daily at 6 PM. Monday through Friday     warfarin 4 MG tablet  Commonly known as:  COUMADIN  Take 4 mg by mouth 2 (two) times a week. Saturday and Sunday         Signed: Verdis Prime 02/15/2015, 2:13 PM

## 2015-02-15 NOTE — Progress Notes (Signed)
Patient ID: Sally Carroll, female   DOB: [birth year], 81 y.o.   MRN: [id number] Alert, conversant, and cooperative this afternoon. Mobilizing with walker & sba. Incision with honeycomb over dermabond. No erythema, swelling, or drainage. Good strength BLE. Minimal lumbar pain. Currently awaiting insurance approval at [employer].  Plan to d/c to snf when insurance approves. Pt & family agree to plan.   Verdis Prime RN BSN

## 2015-02-16 NOTE — Clinical Social Work Placement (Signed)
   CLINICAL SOCIAL WORK PLACEMENT  NOTE  Date:  02/16/2015  Patient Details  Name: Sally Carroll MRN: [id number] Date of Birth: 1934/03/30  Clinical Social Work is seeking post-discharge placement for this patient at the [place] level of care (*CSW will initial, date and re-position this form in  chart as items are completed):  Yes   Patient/family provided with [facility] Work Department's list of facilities offering this level of care within the geographic area requested by the patient (or if unable, by the patient's family).  Yes   Patient/family informed of their freedom to choose among providers that offer the needed level of care, that participate in Medicare, Medicaid or managed care program needed by the patient, have an available bed and are willing to accept the patient.  Yes   Patient/family informed of [facility]'s ownership interest in [facility] and [facility], as well as of the fact that they are under no obligation to receive care at these facilities.  PASRR submitted to [employer] on 02/14/15     PASRR number received on 02/14/15     Existing PASRR number confirmed on       FL2 transmitted to all facilities in geographic area requested by pt/family on 02/14/15     FL2 transmitted to all facilities within larger geographic area on       Patient informed that his/her managed care company has contracts with or will negotiate with certain facilities, including the following:        Yes   Patient/family informed of bed offers received.  Patient chooses bed at [facility]     Physician recommends and patient chooses bed at      Patient to be transferred to [facility] on 02/16/15.  Patient to be transferred to facility by [facility]      Patient family notified on 02/15/15 of transfer.  Name of family member notified:  Elberta Fortis      PHYSICIAN       Additional Comment:     _______________________________________________ Greta Doom, LCSW 02/16/2015, 7:41 AM

## 2015-02-16 NOTE — Progress Notes (Signed)
Discharge order received. Pt dressed and belongings packed by night shift staff. Called report to [facility]. [facility] transported pt to SNF.

## 2015-02-23 ENCOUNTER — Ambulatory Visit: Payer: PRIVATE HEALTH INSURANCE | Admitting: Oncology

## 2015-02-23 ENCOUNTER — Other Ambulatory Visit: Payer: PRIVATE HEALTH INSURANCE

## 2015-04-11 ENCOUNTER — Ambulatory Visit
Admission: RE | Admit: 2015-04-11 | Discharge: 2015-04-11 | Disposition: A | Discharge status: Home / Self Care | Payer: Medicare PPO | Source: Ambulatory Visit | Attending: Oncology | Admitting: Oncology

## 2015-04-11 ENCOUNTER — Other Ambulatory Visit: Payer: Self-pay | Admitting: Oncology

## 2015-04-11 DIAGNOSIS — R928 Other abnormal and inconclusive findings on diagnostic imaging of breast: Secondary | ICD-10-CM

## 2015-04-14 ENCOUNTER — Other Ambulatory Visit: Payer: Self-pay | Admitting: *Deleted

## 2015-04-14 DIAGNOSIS — D0510 Intraductal carcinoma in situ of unspecified breast: Secondary | ICD-10-CM

## 2015-04-17 ENCOUNTER — Inpatient Hospital Stay: Payer: Medicare PPO | Attending: Oncology | Admitting: Oncology

## 2015-04-17 ENCOUNTER — Inpatient Hospital Stay: Payer: Medicare PPO

## 2015-04-17 VITALS — BP 154/70 | HR 65 | Temp 97.2°F | Wt 124.5 lb

## 2015-04-17 DIAGNOSIS — F329 Major depressive disorder, single episode, unspecified: Secondary | ICD-10-CM | POA: Insufficient documentation

## 2015-04-17 DIAGNOSIS — I4891 Unspecified atrial fibrillation: Secondary | ICD-10-CM | POA: Diagnosis not present

## 2015-04-17 DIAGNOSIS — Z79899 Other long term (current) drug therapy: Secondary | ICD-10-CM | POA: Insufficient documentation

## 2015-04-17 DIAGNOSIS — K219 Gastro-esophageal reflux disease without esophagitis: Secondary | ICD-10-CM | POA: Diagnosis not present

## 2015-04-17 DIAGNOSIS — Z950 Presence of cardiac pacemaker: Secondary | ICD-10-CM | POA: Insufficient documentation

## 2015-04-17 DIAGNOSIS — M129 Arthropathy, unspecified: Secondary | ICD-10-CM | POA: Diagnosis not present

## 2015-04-17 DIAGNOSIS — D0510 Intraductal carcinoma in situ of unspecified breast: Secondary | ICD-10-CM

## 2015-04-17 DIAGNOSIS — I10 Essential (primary) hypertension: Secondary | ICD-10-CM | POA: Insufficient documentation

## 2015-04-17 DIAGNOSIS — Z853 Personal history of malignant neoplasm of breast: Secondary | ICD-10-CM | POA: Diagnosis not present

## 2015-04-17 DIAGNOSIS — R011 Cardiac murmur, unspecified: Secondary | ICD-10-CM

## 2015-04-17 DIAGNOSIS — C50919 Malignant neoplasm of unspecified site of unspecified female breast: Secondary | ICD-10-CM

## 2015-04-17 DIAGNOSIS — F419 Anxiety disorder, unspecified: Secondary | ICD-10-CM

## 2015-04-17 DIAGNOSIS — Z7901 Long term (current) use of anticoagulants: Secondary | ICD-10-CM | POA: Diagnosis not present

## 2015-04-17 LAB — CBC WITH DIFFERENTIAL/PLATELET
BASOS PCT: 1 %
Basophils Absolute: 0 10*3/uL (ref 0–0.1)
EOS ABS: 0.1 10*3/uL (ref 0–0.7)
Eosinophils Relative: 1 %
HCT: 32.3 % — ABNORMAL LOW (ref 35.0–47.0)
HEMOGLOBIN: 10.7 g/dL — AB (ref 12.0–16.0)
Lymphocytes Relative: 33 %
Lymphs Abs: 1.4 10*3/uL (ref 1.0–3.6)
MCH: 32.9 pg (ref 26.0–34.0)
MCHC: 33.3 g/dL (ref 32.0–36.0)
MCV: 99 fL (ref 80.0–100.0)
MONO ABS: 0.3 10*3/uL (ref 0.2–0.9)
MONOS PCT: 7 %
NEUTROS PCT: 58 %
Neutro Abs: 2.5 10*3/uL (ref 1.4–6.5)
PLATELETS: 165 10*3/uL (ref 150–440)
RBC: 3.26 MIL/uL — ABNORMAL LOW (ref 3.80–5.20)
RDW: 14 % (ref 11.5–14.5)
WBC: 4.4 10*3/uL (ref 3.6–11.0)

## 2015-04-17 LAB — COMPREHENSIVE METABOLIC PANEL
ALK PHOS: 58 U/L (ref 38–126)
ALT: 20 U/L (ref 14–54)
ANION GAP: 9 (ref 5–15)
AST: 30 U/L (ref 15–41)
Albumin: 4.1 g/dL (ref 3.5–5.0)
BUN: 25 mg/dL — ABNORMAL HIGH (ref 6–20)
CALCIUM: 8.8 mg/dL — AB (ref 8.9–10.3)
CHLORIDE: 101 mmol/L (ref 101–111)
CO2: 27 mmol/L (ref 22–32)
Creatinine, Ser: 0.95 mg/dL (ref 0.44–1.00)
GFR calc non Af Amer: 56 mL/min — ABNORMAL LOW (ref >60)
Glucose, Bld: 163 mg/dL — ABNORMAL HIGH (ref 65–99)
Potassium: 3.9 mmol/L (ref 3.5–5.1)
SODIUM: 137 mmol/L (ref 135–145)
Total Bilirubin: 0.5 mg/dL (ref 0.3–1.2)
Total Protein: 6.7 g/dL (ref 6.5–8.1)

## 2015-04-17 NOTE — Progress Notes (Signed)
Patient does have living will.  Never smoked.  Has mammogram last week.  Normal result.

## 2015-04-30 ENCOUNTER — Encounter: Payer: Self-pay | Admitting: Oncology

## 2015-04-30 NOTE — Progress Notes (Signed)
[place] @ [facility] Telephone:(336) [phone]  Fax:(336) [place]: [birth year]  MR#: [id number]  XLK#:[id number]  Patient Care Team: Tracie Harrier, MD as PCP - General (Internal Medicine)  CHIEF COMPLAINT:  Chief Complaint  Patient presents with  . Follow-up   81 year old female with stage 0 (Tis N0 M0) ductal carcinoma in situ Diagnosis in July 5 ,2011,status post lumpectomy Tamoxifen 20 mg by mouth daily (started in September of 2011) Has been discontinued because of vaginal bleeding in September of 2012. Patient had endometrial biopsy reported to be negative for any malignanc  No history exists.    Oncology Flowsheet 02/10/2015 02/10/2015 02/11/2015 02/12/2015 02/13/2015 02/14/2015 02/15/2015  ALPRAZolam (XANAX) PO 0.25 mg 0.25 mg 0.25 mg 0.25 mg 0.25 mg 0.25 mg 0.25 mg  enoxaparin (LOVENOX) [place] - - 40 mg 40 mg 40 mg 40 mg 40 mg  ondansetron (ZOFRAN) IV -   - - - - -    INTERVAL HISTORY:  81 year old lady with history of carcinoma in situ came today for the follow-up patient is off tamoxifen because of vaginal bleeding and does not want any other anti-hormonal therapy. Patient does have multiple medical complaints has been investigated with CT scan and endoscopy without any significant problem. REVIEW OF SYSTEMS:   GENERAL:  Feels good.  Active.  No fevers, sweats or weight loss. PERFORMANCE STATUS (ECOG):  *01 HEENT:  No visual changes, runny nose, sore throat, mouth sores or tenderness. Lungs: No shortness of breath or cough.  No hemoptysis. Cardiac:  No chest pain, palpitations, orthopnea, or PND. GI:  No nausea, vomiting, diarrhea, constipation, melena or hematochezia. GU:  No urgency, frequency, dysuria, or hematuria. Musculoskeletal:  No back pain.  No joint pain.  No muscle tenderness. Extremities:  No pain or swelling. Skin:  No rashes or skin changes. Neuro:  No headache, numbness or weakness, balance or coordination issues. Endocrine:  No  diabetes, thyroid issues, hot flashes or night sweats. Psych:  No mood changes, depression or anxiety. Pain:  No focal pain. Review of systems:  All other systems reviewed and found to be negative. As per HPI. Otherwise, a complete review of systems is negatve.  PAST MEDICAL HISTORY: Past Medical History  Diagnosis Date  . Presence of permanent cardiac pacemaker   . Hypertension   . Heart murmur   . Depression   . Anxiety   . GERD (gastroesophageal reflux disease)   . Arthritis   . Cancer     rt breast  . Anemia   . Dysrhythmia     CHB s/p PPM, afib history    PAST SURGICAL HISTORY: Past Surgical History  Procedure Laterality Date  . Breast surgery Right     bx  . Eye surgery Bilateral     cataracts  . Pacemaker insertion      99.07  . Lumbar laminectomy/decompression microdiscectomy Right 02/10/2015    Procedure: Right Lumbar Four-Five Microdiskectomy;  Surgeon: Erline Levine, MD;  Location: [facility];  Service: Neurosurgery;  Laterality: Right;  Right L4-5 Microdiskectomy  . Breast biopsy Right 2011    right lumpectomy rad    FAMILY HISTORY No family history on file.  ADVANCED DIRECTIVES:  No flowsheet data found.  HEALTH MAINTENANCE: Social History  Substance Use Topics  . Smoking status: Never Smoker   . Smokeless tobacco: None  . Alcohol Use: No      Allergies  Allergen Reactions  . Naproxen     Other reaction(s):  Other (See Comments) GI upset  . Sucralfate     Other reaction(s): Other (See Comments) Abdominal bloating  . Prednisone Anxiety    Shakes    Current Outpatient Prescriptions  Medication Sig Dispense Refill  . ALPRAZolam (XANAX) 0.25 MG tablet Take 0.25 mg by mouth at bedtime.    . Biotin 2500 MCG CAPS Take 2,500 mcg by mouth 2 (two) times daily.    . carbidopa-levodopa (SINEMET IR) 25-100 MG per tablet Take 1-2 tablets by mouth 3 (three) times daily. Takes 1 tablet at 1800 and 2 tablets at bedtime. May take an additionall tablet if  needed    . carvedilol (COREG) 6.25 MG tablet Take 3.125 mg by mouth 2 (two) times daily with a meal.    . cholecalciferol (VITAMIN D) 1000 UNITS tablet Take 1,000 Units by mouth 2 (two) times daily.    . citalopram (CELEXA) 10 MG tablet Take 10 mg by mouth daily.    . ferrous sulfate 325 (65 FE) MG tablet Take 325 mg by mouth daily with breakfast.    . gabapentin (NEURONTIN) 100 MG capsule Take 200 mg by mouth 3 (three) times daily.    Marland Kitchen losartan (COZAAR) 100 MG tablet Take 100 mg by mouth daily.    . Multiple Vitamins-Minerals (MULTIVITAMIN WITH MINERALS) tablet Take 1 tablet by mouth daily.    . Multiple Vitamins-Minerals (PRESERVISION AREDS) CAPS Take 1 capsule by mouth 2 (two) times daily.    Marland Kitchen omeprazole (PRILOSEC) 20 MG capsule Take 20 mg by mouth daily.    . sennosides-docusate sodium (SENOKOT-S) 8.6-50 MG tablet Take 1 tablet by mouth daily.    . simvastatin (ZOCOR) 20 MG tablet Take 10 mg by mouth daily.    . traZODone (DESYREL) 100 MG tablet Take 100 mg by mouth at bedtime.    Marland Kitchen warfarin (COUMADIN) 4 MG tablet Take by mouth.    Marland Kitchen HYDROcodone-acetaminophen (NORCO) 10-325 MG per tablet Take 1 tablet by mouth 3 (three) times daily.     No current facility-administered medications for this visit.    OBJECTIVE:  Filed Vitals:   04/17/15 1353  BP: 154/70  Pulse: 65  Temp: 97.2 F (36.2 C)     Body mass index is 20.4 kg/(m^2).    ECOG FS:1 - Symptomatic but completely ambulatory  PHYSICAL EXAM: Goal status: Performance status is good.  Patient has not lost significant weight HEENT: No evidence of stomatitis. Sclera and conjunctivae :: No jaundice.   pale looking. Lungs: Air  entry equal on both sides.  No rhonchi.  No rales.  Cardiac: Heart sounds are normal.  No pericardial rub.  No murmur. Lymphatic system: Cervical, axillary, inguinal, lymph nodes not palpable GI: Abdomen is soft.  No ascites.  Liver spleen not palpable.  No tenderness.  Bowel sounds are within normal  limit Lower extremity: No edema Neurological system: Higher functions, cranial nerves intact no evidence of peripheral neuropathy. Skin: No rash.  No ecchymosis.. Examination of breasts within normal limit   LAB RESULTS:  CBC Latest Ref Rng 04/17/2015 02/08/2015  WBC 3.6 - 11.0 K/uL 4.4 5.5  Hemoglobin 12.0 - 16.0 g/dL 10.7(L) 11.0(L)  Hematocrit 35.0 - 47.0 % 32.3(L) 33.8(L)  Platelets 150 - 440 K/uL 165 223    Appointment on 04/17/2015  Component Date Value Ref Range Status  . WBC 04/17/2015 4.4  3.6 - 11.0 K/uL Final  . RBC 04/17/2015 3.26* 3.80 - 5.20 MIL/uL Final  . Hemoglobin 04/17/2015 10.7* 12.0 - 16.0 g/dL Final  .  HCT 04/17/2015 32.3* 35.0 - 47.0 % Final  . MCV 04/17/2015 99.0  80.0 - 100.0 fL Final  . MCH 04/17/2015 32.9  26.0 - 34.0 pg Final  . MCHC 04/17/2015 33.3  32.0 - 36.0 g/dL Final  . RDW 04/17/2015 14.0  11.5 - 14.5 % Final  . Platelets 04/17/2015 165  150 - 440 K/uL Final  . Neutrophils Relative % 04/17/2015 58   Final  . Neutro Abs 04/17/2015 2.5  1.4 - 6.5 K/uL Final  . Lymphocytes Relative 04/17/2015 33   Final  . Lymphs Abs 04/17/2015 1.4  1.0 - 3.6 K/uL Final  . Monocytes Relative 04/17/2015 7   Final  . Monocytes Absolute 04/17/2015 0.3  0.2 - 0.9 K/uL Final  . Eosinophils Relative 04/17/2015 1   Final  . Eosinophils Absolute 04/17/2015 0.1  0 - 0.7 K/uL Final  . Basophils Relative 04/17/2015 1   Final  . Basophils Absolute 04/17/2015 0.0  0 - 0.1 K/uL Final  . Sodium 04/17/2015 137  135 - 145 mmol/L Final  . Potassium 04/17/2015 3.9  3.5 - 5.1 mmol/L Final  . Chloride 04/17/2015 101  101 - 111 mmol/L Final  . CO2 04/17/2015 27  22 - 32 mmol/L Final  . Glucose, Bld 04/17/2015 163* 65 - 99 mg/dL Final  . BUN 04/17/2015 25* 6 - 20 mg/dL Final  . Creatinine, Ser 04/17/2015 0.95  0.44 - 1.00 mg/dL Final  . Calcium 04/17/2015 8.8* 8.9 - 10.3 mg/dL Final  . Total Protein 04/17/2015 6.7  6.5 - 8.1 g/dL Final  . Albumin 04/17/2015 4.1  3.5 - 5.0 g/dL  Final  . AST 04/17/2015 30  15 - 41 U/L Final  . ALT 04/17/2015 20  14 - 54 U/L Final  . Alkaline Phosphatase 04/17/2015 58  38 - 126 U/L Final  . Total Bilirubin 04/17/2015 0.5  0.3 - 1.2 mg/dL Final  . GFR calc non Af Amer 04/17/2015 56* >60 mL/min Final  . GFR calc Af Amer 04/17/2015 >60  >60 mL/min Final   Comment: (NOTE) The eGFR has been calculated using the CKD EPI equation. This calculation has not been validated in all clinical situations. eGFR's persistently <60 mL/min signify possible Chronic Kidney Disease.   . Anion gap 04/17/2015 9  5 - 15 Final      STUDIES: US Breast Ltd Uni Right Inc Axilla  04/11/2015   CLINICAL DATA:  81 year old female with history of right breast cancer in 2011 post lumpectomy reports an area of focal pain in the lower inner right breast. Patient was previously being followed for probably benign calcifications in the region of the lumpectomy site.  EXAM: DIGITAL DIAGNOSTIC RIGHT MAMMOGRAM WITH CAD  ULTRASOUND RIGHT BREAST  COMPARISON:  Previous exam(s).  ACR Breast Density Category c: The breast tissue is heterogeneously dense, which may obscure small masses.  FINDINGS: No suspicious masses or calcifications are seen in the right breast. Postsurgical changes are present in the outer right breast with associated distortion related to prior lumpectomy. A spot compression tangential view was performed over the painful area of concern in the lower inner right breast with no mammographic abnormality seen in this location.  Mammographic images were processed with CAD.  Physical examination at site of pain in the lower inner right breast reveals tenderness at the approximate 4 to 5 o'clock position. No underlying palpable masses.  Targeted ultrasound of the right breast was performed. No discrete masses or abnormalities are seen, only normal appearing fibroglandular tissue  is visualized.  IMPRESSION: 1. No mammographic or sonographic correlate for the area of pain in  the lower inner right breast.  2.  No mammographic evidence of malignancy in the right breast.  RECOMMENDATION: Bilateral diagnostic mammography December 2016.  I have discussed the findings and recommendations with the patient. Results were also provided in writing at the conclusion of the visit. If applicable, a reminder letter will be sent to the patient regarding the next appointment.  BI-RADS CATEGORY  2: Benign.   Electronically Signed   By: Everlean Alstrom M.D.   On: 04/11/2015 12:08   Mm Screening Breast Tomo Bilateral  04/11/2015   CLINICAL DATA:  81 year old female with history of right breast cancer in 2011 post lumpectomy reports an area of focal pain in the lower inner right breast. Patient was previously being followed for probably benign calcifications in the region of the lumpectomy site.  EXAM: DIGITAL DIAGNOSTIC RIGHT MAMMOGRAM WITH CAD  ULTRASOUND RIGHT BREAST  COMPARISON:  Previous exam(s).  ACR Breast Density Category c: The breast tissue is heterogeneously dense, which may obscure small masses.  FINDINGS: No suspicious masses or calcifications are seen in the right breast. Postsurgical changes are present in the outer right breast with associated distortion related to prior lumpectomy. A spot compression tangential view was performed over the painful area of concern in the lower inner right breast with no mammographic abnormality seen in this location.  Mammographic images were processed with CAD.  Physical examination at site of pain in the lower inner right breast reveals tenderness at the approximate 4 to 5 o'clock position. No underlying palpable masses.  Targeted ultrasound of the right breast was performed. No discrete masses or abnormalities are seen, only normal appearing fibroglandular tissue is visualized.  IMPRESSION: 1. No mammographic or sonographic correlate for the area of pain in the lower inner right breast.  2.  No mammographic evidence of malignancy in the right breast.   RECOMMENDATION: Bilateral diagnostic mammography December 2016.  I have discussed the findings and recommendations with the patient. Results were also provided in writing at the conclusion of the visit. If applicable, a reminder letter will be sent to the patient regarding the next appointment.  BI-RADS CATEGORY  2: Benign.   Electronically Signed   By: Everlean Alstrom M.D.   On: 04/11/2015 12:08   Assessment Carcinoma in situ Recent mammogram was within normal limit however there was some abnormality detected so ultrasound was done and no abnormality was found.  MEDICAL DECISION MAKING:  All lab data has been reviewed Anemia We will order  irons studies during next appointment and checked the GI workup  Patient expressed understanding and was in agreement with this plan. She also understands that She can call clinic at any time with any questions, concerns, or complaints.    No matching staging information was found for the patient.  Forest Gleason, MD   04/30/2015 7:19 PM

## 2015-08-07 ENCOUNTER — Ambulatory Visit
Admission: RE | Admit: 2015-08-07 | Discharge: 2015-08-07 | Disposition: A | Discharge status: Home / Self Care | Payer: Medicare PPO | Source: Ambulatory Visit | Attending: Oncology | Admitting: Oncology

## 2015-08-07 ENCOUNTER — Other Ambulatory Visit: Payer: Self-pay | Admitting: Oncology

## 2015-08-07 DIAGNOSIS — C50919 Malignant neoplasm of unspecified site of unspecified female breast: Secondary | ICD-10-CM

## 2015-08-07 DIAGNOSIS — Z853 Personal history of malignant neoplasm of breast: Secondary | ICD-10-CM | POA: Insufficient documentation

## 2015-08-07 HISTORY — DX: Malignant neoplasm of unspecified site of unspecified female breast: C50.919

## 2015-10-18 ENCOUNTER — Inpatient Hospital Stay: Payer: Medicare PPO | Admitting: Oncology

## 2015-10-18 ENCOUNTER — Inpatient Hospital Stay: Payer: Medicare PPO | Attending: Oncology

## 2015-10-30 ENCOUNTER — Inpatient Hospital Stay: Payer: Medicare PPO

## 2015-10-30 ENCOUNTER — Inpatient Hospital Stay: Payer: Medicare PPO | Admitting: Oncology

## 2015-11-03 ENCOUNTER — Encounter: Payer: Self-pay | Admitting: Oncology

## 2015-11-03 ENCOUNTER — Inpatient Hospital Stay: Payer: Medicare PPO | Attending: Oncology | Admitting: Oncology

## 2015-11-03 ENCOUNTER — Inpatient Hospital Stay: Payer: Medicare PPO

## 2015-11-03 VITALS — BP 135/84 | HR 86 | Resp 18 | Wt 132.1 lb

## 2015-11-03 DIAGNOSIS — F418 Other specified anxiety disorders: Secondary | ICD-10-CM | POA: Diagnosis not present

## 2015-11-03 DIAGNOSIS — C50919 Malignant neoplasm of unspecified site of unspecified female breast: Secondary | ICD-10-CM

## 2015-11-03 DIAGNOSIS — Z923 Personal history of irradiation: Secondary | ICD-10-CM | POA: Insufficient documentation

## 2015-11-03 DIAGNOSIS — I10 Essential (primary) hypertension: Secondary | ICD-10-CM | POA: Insufficient documentation

## 2015-11-03 DIAGNOSIS — M129 Arthropathy, unspecified: Secondary | ICD-10-CM | POA: Diagnosis not present

## 2015-11-03 DIAGNOSIS — Z950 Presence of cardiac pacemaker: Secondary | ICD-10-CM | POA: Insufficient documentation

## 2015-11-03 DIAGNOSIS — K219 Gastro-esophageal reflux disease without esophagitis: Secondary | ICD-10-CM | POA: Diagnosis not present

## 2015-11-03 DIAGNOSIS — Z853 Personal history of malignant neoplasm of breast: Secondary | ICD-10-CM | POA: Insufficient documentation

## 2015-11-03 LAB — COMPREHENSIVE METABOLIC PANEL
ALT: 19 U/L (ref 14–54)
ANION GAP: 5 (ref 5–15)
AST: 29 U/L (ref 15–41)
Albumin: 4.3 g/dL (ref 3.5–5.0)
Alkaline Phosphatase: 55 U/L (ref 38–126)
BILIRUBIN TOTAL: 0.3 mg/dL (ref 0.3–1.2)
BUN: 19 mg/dL (ref 6–20)
CHLORIDE: 102 mmol/L (ref 101–111)
CO2: 29 mmol/L (ref 22–32)
Calcium: 9.3 mg/dL (ref 8.9–10.3)
Creatinine, Ser: 0.93 mg/dL (ref 0.44–1.00)
GFR, EST NON AFRICAN AMERICAN: 57 mL/min — AB (ref >60)
Glucose, Bld: 122 mg/dL — ABNORMAL HIGH (ref 65–99)
POTASSIUM: 3.9 mmol/L (ref 3.5–5.1)
Sodium: 136 mmol/L (ref 135–145)
TOTAL PROTEIN: 7.1 g/dL (ref 6.5–8.1)

## 2015-11-03 LAB — CBC WITH DIFFERENTIAL/PLATELET
Basophils Absolute: 0 10*3/uL (ref 0–0.1)
Basophils Relative: 0 %
EOS PCT: 1 %
Eosinophils Absolute: 0.1 10*3/uL (ref 0–0.7)
HEMATOCRIT: 34 % — AB (ref 35.0–47.0)
Hemoglobin: 11.4 g/dL — ABNORMAL LOW (ref 12.0–16.0)
LYMPHS PCT: 44 %
Lymphs Abs: 2 10*3/uL (ref 1.0–3.6)
MCH: 33 pg (ref 26.0–34.0)
MCHC: 33.6 g/dL (ref 32.0–36.0)
MCV: 98 fL (ref 80.0–100.0)
MONO ABS: 0.4 10*3/uL (ref 0.2–0.9)
MONOS PCT: 8 %
NEUTROS ABS: 2.1 10*3/uL (ref 1.4–6.5)
Neutrophils Relative %: 47 %
PLATELETS: 169 10*3/uL (ref 150–440)
RBC: 3.47 MIL/uL — ABNORMAL LOW (ref 3.80–5.20)
RDW: 14.1 % (ref 11.5–14.5)
WBC: 4.5 10*3/uL (ref 3.6–11.0)

## 2015-11-03 NOTE — Progress Notes (Signed)
[place] @ [facility] Telephone:(336) [phone]  Fax:(336) [place]: [birth year]  MR#: [id number]  AUQ#:[id number]  Patient Care Team: Tracie Harrier, MD as PCP - General (Internal Medicine)  CHIEF COMPLAINT:  Chief Complaint  Patient presents with  . Breast Cancer   82 year old female with stage 0 (Tis N0 M0) ductal carcinoma in situ Diagnosis in July 5 ,2011,status post lumpectomy Tamoxifen 20 mg by mouth daily (started in September of 2011) Has been discontinued because of vaginal bleeding in September of 2012. Patient had endometrial biopsy reported to be negative for any malignanc  No history exists.      INTERVAL HISTORY:  82 year old lady with history of carcinoma in situ came today for the follow-up patient is off tamoxifen because of vaginal bleeding and does not want any other anti-hormonal therapy. Patient is here for ongoing evaluation for carcinoma of breast. Patient is off all anti-hormonal therapy.  Patient had received abbreviated course of anti-hormonal treatment.  REVIEW OF SYSTEMS:   GENERAL:  Feels good.  Active.  No fevers, sweats or weight loss. PERFORMANCE STATUS (ECOG):  *01 HEENT:  No visual changes, runny nose, sore throat, mouth sores or tenderness. Lungs: No shortness of breath or cough.  No hemoptysis. Cardiac:  No chest pain, palpitations, orthopnea, or PND. GI:  No nausea, vomiting, diarrhea, constipation, melena or hematochezia. GU:  No urgency, frequency, dysuria, or hematuria. Musculoskeletal:  No back pain.  No joint pain.  No muscle tenderness. Extremities:  No pain or swelling. Skin:  No rashes or skin changes. Neuro:  No headache, numbness or weakness, balance or coordination issues. Endocrine:  No diabetes, thyroid issues, hot flashes or night sweats. Psych:  No mood changes, depression or anxiety. Pain:  No focal pain. Review of systems:  All other systems reviewed and found to be negative. As per HPI.  Otherwise, a complete review of systems is negatve.  PAST MEDICAL HISTORY: Past Medical History  Diagnosis Date  . Presence of permanent cardiac pacemaker   . Hypertension   . Heart murmur   . Depression   . Anxiety   . GERD (gastroesophageal reflux disease)   . Arthritis   . Anemia   . Dysrhythmia     CHB s/p PPM, afib history  . Cancer [facility]) 2011    rt breast  . Breast cancer ([place]) 2011    rt- radiation    PAST SURGICAL HISTORY: Past Surgical History  Procedure Laterality Date  . Breast surgery Right     bx  . Eye surgery Bilateral     cataracts  . Pacemaker insertion      99.07  . Lumbar laminectomy/decompression microdiscectomy Right 02/10/2015    Procedure: Right Lumbar Four-Five Microdiskectomy;  Surgeon: Erline Levine, MD;  Location: [facility];  Service: Neurosurgery;  Laterality: Right;  Right L4-5 Microdiskectomy  . Breast biopsy Right 2011    right lumpectomy rad    FAMILY HISTORY No family history on file.  ADVANCED DIRECTIVES:  No flowsheet data found.  HEALTH MAINTENANCE: Social History  Substance Use Topics  . Smoking status: Never Smoker   . Smokeless tobacco: None  . Alcohol Use: No      Allergies  Allergen Reactions  . Naproxen     Other reaction(s): Other (See Comments) GI upset  . Sucralfate     Other reaction(s): Other (See Comments) Abdominal bloating  . Prednisone Anxiety    Shakes    Current Outpatient Prescriptions  Medication  Sig Dispense Refill  . ALPRAZolam (XANAX) 0.25 MG tablet Take 0.25 mg by mouth at bedtime.    . Biotin 2500 MCG CAPS Take 2,500 mcg by mouth 2 (two) times daily.    . carbidopa-levodopa (SINEMET IR) 25-100 MG per tablet Take 1-2 tablets by mouth 3 (three) times daily. Takes 1 tablet at 1800 and 2 tablets at bedtime. May take an additionall tablet if needed    . carvedilol (COREG) 6.25 MG tablet Take 3.125 mg by mouth 2 (two) times daily with a meal.    . cholecalciferol (VITAMIN D) 1000 UNITS tablet  Take 1,000 Units by mouth 2 (two) times daily.    . citalopram (CELEXA) 10 MG tablet Take 10 mg by mouth daily.    . ferrous sulfate 325 (65 FE) MG tablet Take 325 mg by mouth daily with breakfast.    . gabapentin (NEURONTIN) 100 MG capsule Take 200 mg by mouth 3 (three) times daily.    Marland Kitchen HYDROcodone-acetaminophen (NORCO) 10-325 MG per tablet Take 1 tablet by mouth 3 (three) times daily.    Marland Kitchen losartan (COZAAR) 100 MG tablet Take 100 mg by mouth daily.    . Multiple Vitamins-Minerals (MULTIVITAMIN WITH MINERALS) tablet Take 1 tablet by mouth daily.    . Multiple Vitamins-Minerals (PRESERVISION AREDS) CAPS Take 1 capsule by mouth 2 (two) times daily.    Marland Kitchen omeprazole (PRILOSEC) 20 MG capsule Take 20 mg by mouth daily.    . sennosides-docusate sodium (SENOKOT-S) 8.6-50 MG tablet Take 1 tablet by mouth daily.    . simvastatin (ZOCOR) 20 MG tablet Take 10 mg by mouth daily.    . traZODone (DESYREL) 100 MG tablet Take 100 mg by mouth at bedtime.    Marland Kitchen warfarin (COUMADIN) 4 MG tablet Take by mouth.     No current facility-administered medications for this visit.    OBJECTIVE:  Filed Vitals:   11/03/15 1434  BP: 135/84  Pulse: 86  Resp: 18     Body mass index is 21.63 kg/(m^2).    ECOG FS:1 - Symptomatic but completely ambulatory  PHYSICAL EXAM: Goal status: Performance status is good.  Patient has not lost significant weight HEENT: No evidence of stomatitis. Sclera and conjunctivae :: No jaundice.   pale looking. Lungs: Air  entry equal on both sides.  No rhonchi.  No rales.  Cardiac: Heart sounds are normal.  No pericardial rub.  No murmur. Lymphatic system: Cervical, axillary, inguinal, lymph nodes not palpable GI: Abdomen is soft.  No ascites.  Liver spleen not palpable.  No tenderness.  Bowel sounds are within normal limit Lower extremity: No edema Neurological system: Higher functions, cranial nerves intact no evidence of peripheral neuropathy. Skin: No rash.  No  ecchymosis.. Examination of breasts within normal limit   LAB RESULTS:  CBC Latest Ref Rng 11/03/2015 04/17/2015  WBC 3.6 - 11.0 K/uL 4.5 4.4  Hemoglobin 12.0 - 16.0 g/dL 11.4(L) 10.7(L)  Hematocrit 35.0 - 47.0 % 34.0(L) 32.3(L)  Platelets 150 - 440 K/uL 169 165    Appointment on 11/03/2015  Component Date Value Ref Range Status  . WBC 11/03/2015 4.5  3.6 - 11.0 K/uL Final  . RBC 11/03/2015 3.47* 3.80 - 5.20 MIL/uL Final  . Hemoglobin 11/03/2015 11.4* 12.0 - 16.0 g/dL Final  . HCT 11/03/2015 34.0* 35.0 - 47.0 % Final  . MCV 11/03/2015 98.0  80.0 - 100.0 fL Final  . MCH 11/03/2015 33.0  26.0 - 34.0 pg Final  . MCHC 11/03/2015 33.6  32.0 - 36.0 g/dL Final  . RDW 11/03/2015 14.1  11.5 - 14.5 % Final  . Platelets 11/03/2015 169  150 - 440 K/uL Final  . Neutrophils Relative % 11/03/2015 47   Final  . Neutro Abs 11/03/2015 2.1  1.4 - 6.5 K/uL Final  . Lymphocytes Relative 11/03/2015 44   Final  . Lymphs Abs 11/03/2015 2.0  1.0 - 3.6 K/uL Final  . Monocytes Relative 11/03/2015 8   Final  . Monocytes Absolute 11/03/2015 0.4  0.2 - 0.9 K/uL Final  . Eosinophils Relative 11/03/2015 1   Final  . Eosinophils Absolute 11/03/2015 0.1  0 - 0.7 K/uL Final  . Basophils Relative 11/03/2015 0   Final  . Basophils Absolute 11/03/2015 0.0  0 - 0.1 K/uL Final  . Sodium 11/03/2015 136  135 - 145 mmol/L Final  . Potassium 11/03/2015 3.9  3.5 - 5.1 mmol/L Final  . Chloride 11/03/2015 102  101 - 111 mmol/L Final  . CO2 11/03/2015 29  22 - 32 mmol/L Final  . Glucose, Bld 11/03/2015 122* 65 - 99 mg/dL Final  . BUN 11/03/2015 19  6 - 20 mg/dL Final  . Creatinine, Ser 11/03/2015 0.93  0.44 - 1.00 mg/dL Final  . Calcium 11/03/2015 9.3  8.9 - 10.3 mg/dL Final  . Total Protein 11/03/2015 7.1  6.5 - 8.1 g/dL Final  . Albumin 11/03/2015 4.3  3.5 - 5.0 g/dL Final  . AST 11/03/2015 29  15 - 41 U/L Final  . ALT 11/03/2015 19  14 - 54 U/L Final  . Alkaline Phosphatase 11/03/2015 55  38 - 126 U/L Final  . Total  Bilirubin 11/03/2015 0.3  0.3 - 1.2 mg/dL Final  . GFR calc non Af Amer 11/03/2015 57* >60 mL/min Final  . GFR calc Af Amer 11/03/2015 >60  >60 mL/min Final   Comment: (NOTE) The eGFR has been calculated using the CKD EPI equation. This calculation has not been validated in all clinical situations. eGFR's persistently <60 mL/min signify possible Chronic Kidney Disease.   . Anion gap 11/03/2015 5  5 - 15 Final      STUDIES: No results found. Assessment Carcinoma in situ Recent  mammogram done in December of 2016 was within normal limit.   MEDICAL DECISION MAKING:  All lab data has been reviewed Anemia is improved GI workup as planned by primary care physician Is more than 5 years since diagnosis of carcinoma of breast illness patient is not any specific anti-hormonal therapy.  At this point in time patient has been discharged by me in care of primary care physician. Our cancer center oncology team would be available if needed Patient expressed understanding and was in agreement with this plan. She also understands that She can call clinic at any time with any questions, concerns, or complaints.    No matching staging information was found for the patient.  Forest Gleason, MD   11/03/2015 3:10 PM

## 2015-11-04 ENCOUNTER — Encounter: Payer: Self-pay | Admitting: Oncology

## 2015-11-09 ENCOUNTER — Emergency Department: Payer: Medicare PPO

## 2015-11-09 ENCOUNTER — Inpatient Hospital Stay
Admission: EM | Admit: 2015-11-09 | Discharge: 2015-11-11 | DRG: 194 | Disposition: A | Discharge status: Home / Self Care | Payer: Medicare PPO | Attending: Internal Medicine | Admitting: Internal Medicine

## 2015-11-09 DIAGNOSIS — Z7901 Long term (current) use of anticoagulants: Secondary | ICD-10-CM

## 2015-11-09 DIAGNOSIS — R410 Disorientation, unspecified: Secondary | ICD-10-CM | POA: Diagnosis present

## 2015-11-09 DIAGNOSIS — Z825 Family history of asthma and other chronic lower respiratory diseases: Secondary | ICD-10-CM

## 2015-11-09 DIAGNOSIS — Z79899 Other long term (current) drug therapy: Secondary | ICD-10-CM

## 2015-11-09 DIAGNOSIS — F418 Other specified anxiety disorders: Secondary | ICD-10-CM | POA: Diagnosis present

## 2015-11-09 DIAGNOSIS — I10 Essential (primary) hypertension: Secondary | ICD-10-CM | POA: Diagnosis present

## 2015-11-09 DIAGNOSIS — K219 Gastro-esophageal reflux disease without esophagitis: Secondary | ICD-10-CM | POA: Diagnosis present

## 2015-11-09 DIAGNOSIS — I248 Other forms of acute ischemic heart disease: Secondary | ICD-10-CM | POA: Diagnosis present

## 2015-11-09 DIAGNOSIS — I4891 Unspecified atrial fibrillation: Secondary | ICD-10-CM | POA: Diagnosis present

## 2015-11-09 DIAGNOSIS — Z950 Presence of cardiac pacemaker: Secondary | ICD-10-CM | POA: Diagnosis not present

## 2015-11-09 DIAGNOSIS — G629 Polyneuropathy, unspecified: Secondary | ICD-10-CM | POA: Diagnosis present

## 2015-11-09 DIAGNOSIS — R4182 Altered mental status, unspecified: Secondary | ICD-10-CM | POA: Diagnosis present

## 2015-11-09 DIAGNOSIS — E785 Hyperlipidemia, unspecified: Secondary | ICD-10-CM | POA: Diagnosis present

## 2015-11-09 DIAGNOSIS — Z823 Family history of stroke: Secondary | ICD-10-CM

## 2015-11-09 DIAGNOSIS — J189 Pneumonia, unspecified organism: Principal | ICD-10-CM | POA: Diagnosis present

## 2015-11-09 DIAGNOSIS — R778 Other specified abnormalities of plasma proteins: Secondary | ICD-10-CM

## 2015-11-09 DIAGNOSIS — D649 Anemia, unspecified: Secondary | ICD-10-CM | POA: Diagnosis present

## 2015-11-09 DIAGNOSIS — Z801 Family history of malignant neoplasm of trachea, bronchus and lung: Secondary | ICD-10-CM

## 2015-11-09 DIAGNOSIS — Z853 Personal history of malignant neoplasm of breast: Secondary | ICD-10-CM

## 2015-11-09 DIAGNOSIS — R7989 Other specified abnormal findings of blood chemistry: Secondary | ICD-10-CM

## 2015-11-09 LAB — CBC WITH DIFFERENTIAL/PLATELET
BASOS ABS: 0 10*3/uL (ref 0–0.1)
Basophils Relative: 0 %
EOS ABS: 0 10*3/uL (ref 0–0.7)
Eosinophils Relative: 0 %
HCT: 32.4 % — ABNORMAL LOW (ref 35.0–47.0)
HEMOGLOBIN: 10.8 g/dL — AB (ref 12.0–16.0)
LYMPHS ABS: 1.5 10*3/uL (ref 1.0–3.6)
LYMPHS PCT: 14 %
MCH: 33 pg (ref 26.0–34.0)
MCHC: 33.3 g/dL (ref 32.0–36.0)
MCV: 99.1 fL (ref 80.0–100.0)
Monocytes Absolute: 1.2 10*3/uL — ABNORMAL HIGH (ref 0.2–0.9)
Monocytes Relative: 11 %
NEUTROS PCT: 75 %
Neutro Abs: 8.1 10*3/uL — ABNORMAL HIGH (ref 1.4–6.5)
Platelets: 150 10*3/uL (ref 150–440)
RBC: 3.27 MIL/uL — AB (ref 3.80–5.20)
RDW: 14.2 % (ref 11.5–14.5)
WBC: 10.8 10*3/uL (ref 3.6–11.0)

## 2015-11-09 LAB — INFLUENZA PANEL BY PCR (TYPE A & B)
H1N1FLUPCR: NOT DETECTED
Influenza A By PCR: NEGATIVE
Influenza B By PCR: NEGATIVE

## 2015-11-09 LAB — URINALYSIS COMPLETE WITH MICROSCOPIC (ARMC ONLY)
BACTERIA UA: NONE SEEN
Bilirubin Urine: NEGATIVE
Glucose, UA: NEGATIVE mg/dL
HGB URINE DIPSTICK: NEGATIVE
Leukocytes, UA: NEGATIVE
Nitrite: NEGATIVE
PH: 6 (ref 5.0–8.0)
PROTEIN: 30 mg/dL — AB
Specific Gravity, Urine: 1.02 (ref 1.005–1.030)

## 2015-11-09 LAB — COMPREHENSIVE METABOLIC PANEL
ALBUMIN: 4.2 g/dL (ref 3.5–5.0)
ALK PHOS: 55 U/L (ref 38–126)
ALT: 16 U/L (ref 14–54)
AST: 31 U/L (ref 15–41)
Anion gap: 11 (ref 5–15)
BUN: 18 mg/dL (ref 6–20)
CALCIUM: 9 mg/dL (ref 8.9–10.3)
CO2: 27 mmol/L (ref 22–32)
CREATININE: 1.11 mg/dL — AB (ref 0.44–1.00)
Chloride: 101 mmol/L (ref 101–111)
GFR calc non Af Amer: 46 mL/min — ABNORMAL LOW (ref >60)
GFR, EST AFRICAN AMERICAN: 53 mL/min — AB (ref >60)
GLUCOSE: 128 mg/dL — AB (ref 65–99)
Potassium: 3.7 mmol/L (ref 3.5–5.1)
SODIUM: 139 mmol/L (ref 135–145)
Total Bilirubin: 1.2 mg/dL (ref 0.3–1.2)
Total Protein: 7.1 g/dL (ref 6.5–8.1)

## 2015-11-09 LAB — PROTIME-INR
INR: 1.7
Prothrombin Time: 20 seconds — ABNORMAL HIGH (ref 11.4–15.0)

## 2015-11-09 LAB — APTT: APTT: 43 s — AB (ref 24–36)

## 2015-11-09 LAB — RAPID INFLUENZA A&B ANTIGENS
Influenza A (ARMC): NEGATIVE
Influenza B (ARMC): NEGATIVE

## 2015-11-09 LAB — TROPONIN I: Troponin I: 0.04 ng/mL — ABNORMAL HIGH (ref <0.031)

## 2015-11-09 MED ORDER — IPRATROPIUM-ALBUTEROL 0.5-2.5 (3) MG/3ML IN SOLN: 3.0000 mL | Freq: Four times a day (QID) | RESPIRATORY_TRACT | Status: DC | PRN

## 2015-11-09 MED ORDER — GABAPENTIN 100 MG PO CAPS
200.0000 mg | ORAL_CAPSULE | Freq: Three times a day (TID) | ORAL | Status: DC
  Administered 2015-11-09 – 2015-11-11 (×6): 200 mg via ORAL
  Filled 2015-11-09 (×6): qty 2

## 2015-11-09 MED ORDER — DEXTROSE 5 % IV SOLN
500.0000 mg | Freq: Once | INTRAVENOUS | Status: AC
  Administered 2015-11-09: 500 mg via INTRAVENOUS
  Filled 2015-11-09: qty 500

## 2015-11-09 MED ORDER — DEXTROSE 5 % IV SOLN
500.0000 mg | INTRAVENOUS | Status: DC
  Administered 2015-11-10: 500 mg via INTRAVENOUS
  Filled 2015-11-09 (×2): qty 500

## 2015-11-09 MED ORDER — CARVEDILOL 3.125 MG PO TABS
3.1250 mg | ORAL_TABLET | Freq: Two times a day (BID) | ORAL | Status: DC
  Administered 2015-11-09 – 2015-11-11 (×4): 3.125 mg via ORAL
  Filled 2015-11-09 (×4): qty 1

## 2015-11-09 MED ORDER — SIMVASTATIN 20 MG PO TABS
10.0000 mg | ORAL_TABLET | Freq: Every day | ORAL | Status: DC
  Administered 2015-11-10 – 2015-11-11 (×2): 10 mg via ORAL
  Filled 2015-11-09 (×2): qty 1

## 2015-11-09 MED ORDER — SODIUM CHLORIDE 0.9 % IV SOLN: 250.0000 mL | INTRAVENOUS | Status: DC | PRN

## 2015-11-09 MED ORDER — DEXTROSE 5 % IV SOLN
1.0000 g | INTRAVENOUS | Status: DC
  Administered 2015-11-10: 1 g via INTRAVENOUS
  Filled 2015-11-09 (×2): qty 10

## 2015-11-09 MED ORDER — CITALOPRAM HYDROBROMIDE 20 MG PO TABS
10.0000 mg | ORAL_TABLET | Freq: Every day | ORAL | Status: DC
  Administered 2015-11-10 – 2015-11-11 (×2): 10 mg via ORAL
  Filled 2015-11-09 (×2): qty 1

## 2015-11-09 MED ORDER — LOSARTAN POTASSIUM 50 MG PO TABS
50.0000 mg | ORAL_TABLET | Freq: Every day | ORAL | Status: DC
  Administered 2015-11-10 – 2015-11-11 (×2): 50 mg via ORAL
  Filled 2015-11-09 (×2): qty 1

## 2015-11-09 MED ORDER — SODIUM CHLORIDE 0.9% FLUSH
3.0000 mL | Freq: Two times a day (BID) | INTRAVENOUS | Status: DC
  Administered 2015-11-09 – 2015-11-11 (×4): 3 mL via INTRAVENOUS

## 2015-11-09 MED ORDER — DEXTROSE 5 % IV SOLN
1.0000 g | Freq: Once | INTRAVENOUS | Status: AC
  Administered 2015-11-09: 1 g via INTRAVENOUS
  Filled 2015-11-09: qty 10

## 2015-11-09 MED ORDER — BIOTIN 2500 MCG PO CAPS: 5000.0000 ug | ORAL_CAPSULE | Freq: Every day | ORAL | Status: DC

## 2015-11-09 MED ORDER — ALPRAZOLAM 0.25 MG PO TABS
0.2500 mg | ORAL_TABLET | Freq: Every day | ORAL | Status: DC
  Administered 2015-11-09 – 2015-11-10 (×2): 0.25 mg via ORAL
  Filled 2015-11-09 (×2): qty 1

## 2015-11-09 MED ORDER — WARFARIN SODIUM 3 MG PO TABS
5.0000 mg | ORAL_TABLET | Freq: Every evening | ORAL | Status: DC
  Administered 2015-11-09: 5 mg via ORAL
  Filled 2015-11-09: qty 1

## 2015-11-09 MED ORDER — FERROUS SULFATE 325 (65 FE) MG PO TABS
325.0000 mg | ORAL_TABLET | Freq: Every day | ORAL | Status: DC
  Administered 2015-11-10 – 2015-11-11 (×2): 325 mg via ORAL
  Filled 2015-11-09 (×2): qty 1

## 2015-11-09 MED ORDER — WARFARIN - PHYSICIAN DOSING INPATIENT: Freq: Every day | Status: DC

## 2015-11-09 MED ORDER — ASPIRIN 81 MG PO CHEW
324.0000 mg | CHEWABLE_TABLET | Freq: Once | ORAL | Status: AC
  Administered 2015-11-09: 324 mg via ORAL
  Filled 2015-11-09: qty 4

## 2015-11-09 MED ORDER — VITAMIN D 1000 UNITS PO TABS
1000.0000 [IU] | ORAL_TABLET | Freq: Two times a day (BID) | ORAL | Status: DC
  Administered 2015-11-09 – 2015-11-11 (×4): 1000 [IU] via ORAL
  Filled 2015-11-09 (×4): qty 1

## 2015-11-09 MED ORDER — SODIUM CHLORIDE 0.9% FLUSH: 3.0000 mL | INTRAVENOUS | Status: DC | PRN

## 2015-11-09 NOTE — H&P (Signed)
[facility] NAME: Sally Carroll    MR#:  [id number]  DATE OF BIRTH:  01-19-1934  DATE OF ADMISSION:  11/09/2015  PRIMARY CARE PHYSICIAN: Tracie Harrier, MD   REQUESTING/REFERRING PHYSICIAN: Joanne Gavel, MD  CHIEF COMPLAINT:   Chief Complaint  Patient presents with  . Altered Mental Status   Confusion, cough and shortness breath. HISTORY OF PRESENT ILLNESS:  Sally Carroll  is a 82 y.o. female with a known history of hypertension, anemia and breast cancer. The patient was sent to ED from home due to confusion, cough and shortness of breath. She denies any fever or chills, no chest pain, palpitation, orthopnea or nocturnal dyspnea or leg edema. According to her daughter, the patient was found confused and congested since last night. Chest x-ray show right lower lobe pneumonia. She was treated with this max and Rocephin in the ED.  PAST MEDICAL HISTORY:   Past Medical History  Diagnosis Date  . Presence of permanent cardiac pacemaker   . Hypertension   . Heart murmur   . Depression   . Anxiety   . GERD (gastroesophageal reflux disease)   . Arthritis   . Anemia   . Dysrhythmia     CHB s/p PPM, afib history  . Cancer [facility]) 2011    rt breast  . Breast cancer ([place]) 2011    rt- radiation    PAST SURGICAL HISTORY:   Past Surgical History  Procedure Laterality Date  . Breast surgery Right     bx  . Eye surgery Bilateral     cataracts  . Pacemaker insertion      99.07  . Lumbar laminectomy/decompression microdiscectomy Right 02/10/2015    Procedure: Right Lumbar Four-Five Microdiskectomy;  Surgeon: Erline Levine, MD;  Location: [place] [facility];  Service: Neurosurgery;  Laterality: Right;  Right L4-5 Microdiskectomy  . Breast biopsy Right 2011    right lumpectomy rad    SOCIAL HISTORY:   Social History  Substance Use Topics  . Smoking status: Never Smoker   . Smokeless tobacco: Not on file  . Alcohol Use: No     FAMILY HISTORY:   Family History  Problem Relation Age of Onset  . COPD Mother   . Stroke Mother   . Atrial fibrillation Mother   . Lung cancer Father     DRUG ALLERGIES:   Allergies  Allergen Reactions  . Naproxen     Other reaction(s): Other (See Comments) GI upset  . Sucralfate     Other reaction(s): Other (See Comments) Abdominal bloating  . Prednisone Anxiety    Shakes    REVIEW OF SYSTEMS:  CONSTITUTIONAL: No fever, generalized weakness.  EYES: No blurred or double vision.  EARS, NOSE, AND THROAT: No tinnitus or ear pain.  RESPIRATORY: has cough, shortness of breath, no wheezing or hemoptysis.  CARDIOVASCULAR: No chest pain, orthopnea, edema.  GASTROINTESTINAL: No nausea, vomiting, diarrhea or abdominal pain.  GENITOURINARY: No dysuria, hematuria.  ENDOCRINE: No polyuria, nocturia,  HEMATOLOGY: No anemia, easy bruising or bleeding SKIN: No rash or lesion. MUSCULOSKELETAL: No joint pain or arthritis.   NEUROLOGIC: No tingling, numbness, weakness.  PSYCHIATRY: No anxiety or depression.   MEDICATIONS AT HOME:   Prior to Admission medications   Medication Sig Start Date End Date Taking? Authorizing Provider  ALPRAZolam (XANAX) 0.25 MG tablet Take 0.25 mg by mouth at bedtime.   Yes Historical Provider, MD  Biotin 2500 MCG CAPS Take 5,000 mcg by  mouth daily.    Yes Historical Provider, MD  carvedilol (COREG) 6.25 MG tablet Take 3.125 mg by mouth 2 (two) times daily with a meal.   Yes Historical Provider, MD  cholecalciferol (VITAMIN D) 1000 UNITS tablet Take 1,000 Units by mouth 2 (two) times daily.   Yes Historical Provider, MD  citalopram (CELEXA) 10 MG tablet Take 10 mg by mouth daily.   Yes Historical Provider, MD  ferrous sulfate 325 (65 FE) MG tablet Take 325 mg by mouth daily with breakfast.   Yes Historical Provider, MD  gabapentin (NEURONTIN) 100 MG capsule Take 200 mg by mouth 3 (three) times daily.   Yes Historical Provider, MD  losartan (COZAAR) 50  MG tablet Take 50 mg by mouth daily.   Yes Historical Provider, MD  Multiple Vitamins-Minerals (MULTIVITAMIN WITH MINERALS) tablet Take 1 tablet by mouth daily.   Yes Historical Provider, MD  Multiple Vitamins-Minerals (PRESERVISION AREDS) CAPS Take 1 capsule by mouth 2 (two) times daily.   Yes Historical Provider, MD  simvastatin (ZOCOR) 20 MG tablet Take 10 mg by mouth daily.   Yes Historical Provider, MD  warfarin (COUMADIN) 5 MG tablet Take 5 mg by mouth every evening.   Yes Historical Provider, MD  carbidopa-levodopa (SINEMET IR) 25-100 MG per tablet Take 1-2 tablets by mouth 3 (three) times daily. Takes 1 tablet at 1800 and 2 tablets at bedtime. May take an additionall tablet if needed    Historical Provider, MD  HYDROcodone-acetaminophen (NORCO) 10-325 MG per tablet Take 1 tablet by mouth 3 (three) times daily.    Historical Provider, MD  losartan (COZAAR) 100 MG tablet Take 100 mg by mouth daily.    Historical Provider, MD  omeprazole (PRILOSEC) 20 MG capsule Take 20 mg by mouth daily.    Historical Provider, MD  sennosides-docusate sodium (SENOKOT-S) 8.6-50 MG tablet Take 1 tablet by mouth daily.    Historical Provider, MD  traZODone (DESYREL) 100 MG tablet Take 100 mg by mouth at bedtime.    Historical Provider, MD  warfarin (COUMADIN) 4 MG tablet Take by mouth. 01/25/15 01/25/16  Historical Provider, MD      VITAL SIGNS:  Blood pressure 154/70, pulse 80, temperature 99.3 F (37.4 C), temperature source Oral, resp. rate 14, SpO2 98 %.  PHYSICAL EXAMINATION:  GENERAL:  82 y.o.-year-old patient lying in the bed with no acute distress.  EYES: Pupils equal, round, reactive to light and accommodation. No scleral icterus. Extraocular muscles intact.  HEENT: Head atraumatic, normocephalic. Oropharynx and nasopharynx clear.  NECK:  Supple, no jugular venous distention. No thyroid enlargement, no tenderness.  LUNGS: Normal breath sounds bilaterally, no wheezing, rales,rhonchi or crepitation.  No use of accessory muscles of respiration.  CARDIOVASCULAR: S1, S2 normal. No murmurs, rubs, or gallops.  ABDOMEN: Soft, nontender, nondistended. Bowel sounds present. No organomegaly or mass.  EXTREMITIES: No pedal edema, cyanosis, or clubbing.  NEUROLOGIC: Cranial nerves II through XII are intact. Muscle strength 5/5 in all extremities. Sensation intact. Gait not checked.  PSYCHIATRIC: The patient is alert and oriented x 3.  SKIN: No obvious rash, lesion, or ulcer.   LABORATORY PANEL:   CBC  Recent Labs Lab 11/09/15 1346  WBC 10.8  HGB 10.8*  HCT 32.4*  PLT 150   ------------------------------------------------------------------------------------------------------------------  Chemistries   Recent Labs Lab 11/09/15 1346  NA 139  K 3.7  CL 101  CO2 27  GLUCOSE 128*  BUN 18  CREATININE 1.11*  CALCIUM 9.0  AST 31  ALT 16  ALKPHOS 55  BILITOT 1.2   ------------------------------------------------------------------------------------------------------------------  Cardiac Enzymes  Recent Labs Lab 11/09/15 1346  TROPONINI 0.04*   ------------------------------------------------------------------------------------------------------------------  RADIOLOGY:  Dg Chest 2 View  11/09/2015  CLINICAL DATA:  Weakness and coughing for 2 days. Altered mental status. EXAM: CHEST  2 VIEW COMPARISON:  Chest x-ray dated 10/24/2013 FINDINGS: Heart size is upper normal, stable. Overall cardiomediastinal silhouette is stable in size and configuration. Left chest wall pacemaker/AICD is stable in position. Subtle airspace opacities are seen within the right upper lobe, suspicious for early developing pneumonia. Lungs otherwise clear. No pleural effusion. Osseous structures about the chest are unremarkable. IMPRESSION: Subtle airspace opacities within the right upper lobe, suspicious for early developing pneumonia. Electronically Signed   By: Franki Cabot M.D.   On: 11/09/2015 14:21    Ct Head Wo Contrast  11/09/2015  CLINICAL DATA:  Pt bib EMS w/ c/o alt mental status. Per EMS family called out and stated that pt "was not acting right". Pt sts she has been feeling weak and attributes it to being outside in the wind and cough. EXAM: CT HEAD WITHOUT CONTRAST TECHNIQUE: Contiguous axial images were obtained from the base of the skull through the vertex without intravenous contrast. COMPARISON:  PET-CT 06/05/2010 FINDINGS: There is central and cortical atrophy. Periventricular white matter changes are consistent with small vessel disease. There is no intra or extra-axial fluid collection or mass lesion. The basilar cisterns and ventricles have a normal appearance. There is no CT evidence for acute infarction or hemorrhage. Bone windows are unremarkable. IMPRESSION: Atrophy and small vessel disease. No evidence for acute intracranial abnormality. Electronically Signed   By: Nolon Nations M.D.   On: 11/09/2015 14:34    EKG:   Orders placed or performed during the hospital encounter of 11/09/15  . ED EKG  . ED EKG    IMPRESSION AND PLAN:   Lower lobe pneumonia (CAP) The patient will be admitted to medical floor. Continue Zithromax and Rocephin, DuoNeb and Robitussin when necessary. Follow-up CBC, sputum and blood culture.  Hypertension. Continue losartan and Coreg.  Anemia. Stable.   All the records are reviewed and case discussed with ED provider. Management plans discussed with the patient, her daughter and niece, and they are in agreement.  CODE STATUS: Full code  TOTAL TIME TAKING CARE OF THIS PATIENT: 56 minutes.    Demetrios Loll M.D on 11/09/2015 at 3:24 PM  Between 7am to 6pm - Pager - [phone]  After 6pm go to [email or web address] - password [email or web address]  Office  [phone]  CC: Primary care physician; Tracie Harrier, MD

## 2015-11-09 NOTE — ED Provider Notes (Signed)
[facility] Emergency Department Provider Note  ____________________________________________  Time seen: Approximately 1:55 PM  I have reviewed the triage vital signs and the nursing notes.   HISTORY  Chief Complaint Altered Mental Status    HPI Sally Carroll is a 82 y.o. female with history of hypertension, heart block status post pacemaker on warfarin, hypertension who presents for evaluation of cough as well as some mild confusion since last night, gradual onset, intermittent, currently mild, no modifying factors. She reports that yesterday she was out in the cold and she developed coughing and runny nose. No fever. No chest pain or difficulty breathing. She has been feeling generally weak since that time and her daughter reports that she is "not talking like herself". When asked to expound on this, daughter reports that the patient was mildly confused when she talked to her earlier this morning. No recent falls. The patient was briefly treated with antibiotics for urinary tract infection per she denies dysuria. No abdominal pain, vomiting, diarrhea.   Past Medical History  Diagnosis Date  . Presence of permanent cardiac pacemaker   . Hypertension   . Heart murmur   . Depression   . Anxiety   . GERD (gastroesophageal reflux disease)   . Arthritis   . Anemia   . Dysrhythmia     CHB s/p PPM, afib history  . Cancer [facility]) 2011    rt breast  . Breast cancer ([place]) 2011    rt- radiation    Patient Active Problem List   Diagnosis Date Noted  . Pneumonia 11/09/2015  . Herniated lumbar intervertebral disc 02/10/2015  . Absolute anemia 01/14/2014  . Anxiety and depression 08/29/2011  . A-fib ([place]) 08/29/2011  . Breast CA ([place]) 08/29/2011  . Artificial cardiac pacemaker 08/29/2011  . Acquired complete AV block ([place]) 08/29/2011    Past Surgical History  Procedure Laterality Date  . Breast surgery Right     bx  . Eye surgery Bilateral     cataracts   . Pacemaker insertion      99.07  . Lumbar laminectomy/decompression microdiscectomy Right 02/10/2015    Procedure: Right Lumbar Four-Five Microdiskectomy;  Surgeon: Erline Levine, MD;  Location: [place] [facility];  Service: Neurosurgery;  Laterality: Right;  Right L4-5 Microdiskectomy  . Breast biopsy Right 2011    right lumpectomy rad    Current Outpatient Rx  Name  Route  Sig  Dispense  Refill  . ALPRAZolam (XANAX) 0.25 MG tablet   Oral   Take 0.25 mg by mouth at bedtime.         . Biotin 2500 MCG CAPS   Oral   Take 5,000 mcg by mouth daily.          . carvedilol (COREG) 6.25 MG tablet   Oral   Take 3.125 mg by mouth 2 (two) times daily with a meal.         . cholecalciferol (VITAMIN D) 1000 UNITS tablet   Oral   Take 1,000 Units by mouth 2 (two) times daily.         . citalopram (CELEXA) 10 MG tablet   Oral   Take 10 mg by mouth daily.         . ferrous sulfate 325 (65 FE) MG tablet   Oral   Take 325 mg by mouth daily with breakfast.         . gabapentin (NEURONTIN) 100 MG capsule   Oral   Take 200 mg by mouth  3 (three) times daily.         Marland Kitchen losartan (COZAAR) 50 MG tablet   Oral   Take 50 mg by mouth daily.         . Multiple Vitamins-Minerals (MULTIVITAMIN WITH MINERALS) tablet   Oral   Take 1 tablet by mouth daily.         . Multiple Vitamins-Minerals (PRESERVISION AREDS) CAPS   Oral   Take 1 capsule by mouth 2 (two) times daily.         . simvastatin (ZOCOR) 20 MG tablet   Oral   Take 10 mg by mouth daily.         Marland Kitchen warfarin (COUMADIN) 5 MG tablet   Oral   Take 5 mg by mouth every evening.         . carbidopa-levodopa (SINEMET IR) 25-100 MG per tablet   Oral   Take 1-2 tablets by mouth 3 (three) times daily. Takes 1 tablet at 1800 and 2 tablets at bedtime. May take an additionall tablet if needed         . HYDROcodone-acetaminophen (NORCO) 10-325 MG per tablet   Oral   Take 1 tablet by mouth 3 (three) times daily.          Marland Kitchen losartan (COZAAR) 100 MG tablet   Oral   Take 100 mg by mouth daily.         Marland Kitchen omeprazole (PRILOSEC) 20 MG capsule   Oral   Take 20 mg by mouth daily.         . sennosides-docusate sodium (SENOKOT-S) 8.6-50 MG tablet   Oral   Take 1 tablet by mouth daily.         . traZODone (DESYREL) 100 MG tablet   Oral   Take 100 mg by mouth at bedtime.         Marland Kitchen warfarin (COUMADIN) 4 MG tablet   Oral   Take by mouth.           Allergies Naproxen; Sucralfate; and Prednisone  Family History  Problem Relation Age of Onset  . COPD Mother   . Stroke Mother   . Atrial fibrillation Mother   . Lung cancer Father     Social History Social History  Substance Use Topics  . Smoking status: Never Smoker   . Smokeless tobacco: None  . Alcohol Use: No    Review of Systems Constitutional: No fever/chills Eyes: No visual changes. ENT: No sore throat. Cardiovascular: Denies chest pain. Respiratory: Denies shortness of breath. Gastrointestinal: No abdominal pain.  No nausea, no vomiting.  No diarrhea.  No constipation. Genitourinary: Negative for dysuria. Musculoskeletal: Negative for back pain. Skin: Negative for rash. Neurological: Negative for headaches, focal weakness or numbness.  10-point ROS otherwise negative.  ____________________________________________   PHYSICAL EXAM:  VITAL SIGNS: ED Triage Vitals  Enc Vitals Group     BP 11/09/15 1336 162/78 mmHg     Pulse Rate 11/09/15 1336 77     Resp 11/09/15 1336 14     Temp 11/09/15 1336 99.3 F (37.4 C)     Temp Source 11/09/15 1336 Oral     SpO2 11/09/15 1336 99 %     Weight --      Height --      Head Cir --      Peak Flow --      Pain Score 11/09/15 1336 0     Pain Loc --      Pain Edu? --  Excl. in [place]? --     Constitutional: Alert and oriented to self and place but confused about year. Oriented to situation. Well appearing and in no acute distress. Eyes: Conjunctivae are normal. PERRL.  EOMI. Head: Atraumatic. Nose: + mild congestion/rhinnorhea. Mouth/Throat: Mucous membranes are moist.  Oropharynx non-erythematous. Neck: No stridor.  Supple without meningismus. Cardiovascular: Normal rate, regular rhythm. Grossly normal heart sounds.  Good peripheral circulation. Respiratory: Normal respiratory effort.  No retractions. Lungs CTAB. Gastrointestinal: Soft and nontender. No distention.  No CVA tenderness. Genitourinary: deferred Musculoskeletal: No lower extremity tenderness nor edema.  No joint effusions. Neurologic:  Normal speech and language. No gross focal neurologic deficits are appreciated. No gait instability. 5 out of 5 strength bilateral upper and lower extremities, sensation intact to light touch throughout. Cranial nerves II through XII intact. Skin:  Skin is warm, dry and intact. No rash noted. Psychiatric: Mood and affect are normal. Speech and behavior are normal.  ____________________________________________   LABS (all labs ordered are listed, but only abnormal results are displayed)  Labs Reviewed  CBC WITH DIFFERENTIAL/PLATELET - Abnormal; Notable for the following:    RBC 3.27 (*)    Hemoglobin 10.8 (*)    HCT 32.4 (*)    Neutro Abs 8.1 (*)    Monocytes Absolute 1.2 (*)    All other components within normal limits  COMPREHENSIVE METABOLIC PANEL - Abnormal; Notable for the following:    Glucose, Bld 128 (*)    Creatinine, Ser 1.11 (*)    GFR calc non Af Amer 46 (*)    GFR calc Af Amer 53 (*)    All other components within normal limits  TROPONIN I - Abnormal; Notable for the following:    Troponin I 0.04 (*)    All other components within normal limits  URINALYSIS COMPLETEWITH MICROSCOPIC ([facility] ONLY) - Abnormal; Notable for the following:    Color, Urine YELLOW (*)    APPearance CLEAR (*)    Ketones, ur TRACE (*)    Protein, ur 30 (*)    Squamous Epithelial / LPF 0-5 (*)    All other components within normal limits  PROTIME-INR -  Abnormal; Notable for the following:    Prothrombin Time 20.0 (*)    All other components within normal limits  APTT - Abnormal; Notable for the following:    aPTT 43 (*)    All other components within normal limits  RAPID INFLUENZA A&B ANTIGENS ([facility] ONLY)  CULTURE, BLOOD (ROUTINE X 2)  CULTURE, BLOOD (ROUTINE X 2)   ____________________________________________  EKG  ED ECG REPORT I, Joanne Gavel, the attending physician, personally viewed and interpreted this ECG.   Date: 11/09/2015  EKG Time: 13:37  Rate: 78  Rhythm: A-V dual paced rhythm   ____________________________________________  RADIOLOGY  CXR IMPRESSION: Subtle airspace opacities within the right upper lobe, suspicious for early developing pneumonia.    CT head IMPRESSION: Atrophy and small vessel disease. No evidence for acute intracranial abnormality. ____________________________________________   PROCEDURES  Procedure(s) performed: None  Critical Care performed: No  ____________________________________________   INITIAL IMPRESSION / ASSESSMENT AND PLAN / ED COURSE  Pertinent labs & imaging results that were available during my care of the patient were reviewed by me and considered in my medical decision making (see chart for details).  ADABELLA MANSOUR is a 82 y.o. female with history of hypertension, heart block status post pacemaker on warfarin, hypertension who presents for evaluation of cough as well as some mild confusion  since last night. On exam, she is well-appearing and in no acute distress. Vital signs are stable, she is afebrile. She does have mild nasal congestion and is speaking with a slightly hoarse voice likely secondary to upper respiratory tract infection. She is alert and oriented to self, place situation but is slightly confused about the year. The remainder of her examination is benign. Plan for screening labs, chest x-ray, urinalysis, CT head. Reassess for  disposition.  ----------------------------------------- 3:29 PM on 11/09/2015 ----------------------------------------- Labs eviewed and are notable for mild anemia, mild creatinine elevation, troponin mildly elevated 0.04. Aspirin ordered. Chest x-ray with developing right upper lobe infiltrate concerning for pneumonia. Given her age, multiple comorbidities, confusion, IV ceftriaxone and azithromycin ordered and I have discussed the case with the hospitalist for admission. ____________________________________________   FINAL CLINICAL IMPRESSION(S) / ED DIAGNOSES  Final diagnoses:  Community acquired pneumonia  Confusion  Troponin level elevated      Joanne Gavel, MD 11/09/15 1530

## 2015-11-09 NOTE — ED Notes (Signed)
Pt assisted up to bathroom. Family at bedside.

## 2015-11-09 NOTE — ED Notes (Signed)
Pt bib EMS w/ c/o alt mental status.  Per EMS family called out and stated that pt "was not acting right".  Pt sts she has been feeling weak and attributes it to being outside in the wind and cough.  Pt alert and oriented to self and situation.  Pt denies n/v/d, SOB, CP or LOC.  Pt able to move all limbs w/o trouble.  CBG 134.

## 2015-11-09 NOTE — Progress Notes (Signed)

## 2015-11-09 NOTE — Progress Notes (Signed)
Pharmacy Antibiotic Note  Sally Carroll is a 82 y.o. female admitted on 11/09/2015 with pneumonia.  Pharmacy has been consulted for ceftriaxone and azithromycin dosing.  Plan: Ceftriaxone 1 g iv q 24 hours.  Azithromycin 500 mg iv q 24 hours.   Weight: 127 lb 8 oz (57.834 kg)  Temp (24hrs), Avg:99 F (37.2 C), Min:98.7 F (37.1 C), Max:99.3 F (37.4 C)   Recent Labs Lab 11/03/15 1414 11/09/15 1346  WBC 4.5 10.8  CREATININE 0.93 1.11*    CrCl cannot be calculated (Unknown ideal weight.).    Allergies  Allergen Reactions  . Naproxen     Other reaction(s): Other (See Comments) GI upset  . Sucralfate     Other reaction(s): Other (See Comments) Abdominal bloating  . Prednisone Anxiety    Shakes    Antimicrobials this admission: Azithromycin 3/9 >>  ceftriaxone 3/9 >>   Dose adjustments this admission:   Microbiology results: 3/9 BCx: pending Sputum: pending   Thank you for allowing pharmacy to be a part of this patient's care.  Napoleon Form 11/09/2015 5:41 PM

## 2015-11-10 ENCOUNTER — Encounter: Payer: Self-pay | Admitting: General Practice

## 2015-11-10 LAB — EXPECTORATED SPUTUM ASSESSMENT W GRAM STAIN, RFLX TO RESP C

## 2015-11-10 LAB — PROTIME-INR
INR: 1.9
Prothrombin Time: 21.7 seconds — ABNORMAL HIGH (ref 11.4–15.0)

## 2015-11-10 LAB — EXPECTORATED SPUTUM ASSESSMENT W REFEX TO RESP CULTURE

## 2015-11-10 MED ORDER — WARFARIN SODIUM 3 MG PO TABS
5.0000 mg | ORAL_TABLET | Freq: Every day | ORAL | Status: DC
  Administered 2015-11-10: 5 mg via ORAL
  Filled 2015-11-10: qty 1

## 2015-11-10 NOTE — Progress Notes (Signed)
Patient tearful this am. Concerned about the hoarseness and if she would be able to get her voice back. Comforted patient, assisted patient to bathroom , teeth brush. Will follow-up with her later today.

## 2015-11-10 NOTE — Progress Notes (Signed)
Pharmacy Antibiotic Note  Sally Carroll is a 82 y.o. female admitted on 11/09/2015 with pneumonia.  Pharmacy has been consulted for ceftriaxone and azithromycin dosing.  Plan: Continue  Ceftriaxone 1 g iv q 24 hours.  Azithromycin 500 mg iv q 24 hours.   Height: 5\' 4"  (162.6 cm) Weight: 127 lb 8 oz (57.834 kg) IBW/kg (Calculated) : 54.7  Temp (24hrs), Avg:99.6 F (37.6 C), Min:98.6 F (37 C), Max:100.8 F (38.2 C)   Recent Labs Lab 11/09/15 1346  WBC 10.8  CREATININE 1.11*    Estimated Creatinine Clearance: 35.5 mL/min (by C-G formula based on Cr of 1.11).    Allergies  Allergen Reactions  . Naproxen     Other reaction(s): Other (See Comments) GI upset  . Sucralfate     Other reaction(s): Other (See Comments) Abdominal bloating  . Prednisone Anxiety    Shakes    Antimicrobials this admission: Azithromycin 3/9 >>  ceftriaxone 3/9 >>   Dose adjustments this admission:   Microbiology results: 3/9 BCx: pending Sputum: pending  Flu-neg  Thank you for allowing pharmacy to be a part of this patient's care.  Ramond Dial 11/10/2015 2:54 PM

## 2015-11-10 NOTE — Progress Notes (Signed)
Patient ID: Sally Carroll, female   DOB: May 19, 1934, 82 y.o.   MRN: [id number] [facility] PROGRESS NOTE  Sally Carroll [id number] DOB: 05-Aug-1934 DOA: 11/09/2015 PCP: Tracie Harrier, MD  HPI/Subjective: Patient feeling better than when she came in. Tearful about having pneumonia. Still with hoarse voice. Coughing. Some shortness of breath.  Objective: Filed Vitals:   11/10/15 0405 11/10/15 0750  BP: 127/59 157/86  Pulse: 73 72  Temp: 100.2 F (37.9 C) 98.6 F (37 C)  Resp: 17 18    Filed Weights   11/09/15 1714  Weight: 57.834 kg (127 lb 8 oz)    ROS: Review of Systems  Constitutional: Positive for fever. Negative for chills.  Eyes: Negative for blurred vision.  Respiratory: Positive for cough and shortness of breath.   Cardiovascular: Negative for chest pain.  Gastrointestinal: Negative for nausea, vomiting, abdominal pain, diarrhea and constipation.  Genitourinary: Negative for dysuria.  Musculoskeletal: Negative for joint pain.  Neurological: Positive for weakness. Negative for dizziness and headaches.   Exam: Physical Exam  Constitutional: She is oriented to person, place, and time.  HENT:  Nose: No mucosal edema.  Mouth/Throat: No oropharyngeal exudate or posterior oropharyngeal edema.  Eyes: Conjunctivae, EOM and lids are normal. Pupils are equal, round, and reactive to light.  Neck: No JVD present. Carotid bruit is not present. No edema present. No thyroid mass and no thyromegaly present.  Cardiovascular: S1 normal and S2 normal.  Exam reveals no gallop.   No murmur heard. Pulses:      Dorsalis pedis pulses are 2+ on the right side, and 2+ on the left side.  Respiratory: No respiratory distress. She has no wheezes. She has rhonchi in the right lower field. She has no rales.  GI: Soft. Bowel sounds are normal. There is no tenderness.  Musculoskeletal:       Right ankle: She exhibits no swelling.       Left ankle: She exhibits no  swelling.  Lymphadenopathy:    She has no cervical adenopathy.  Neurological: She is alert and oriented to person, place, and time. No cranial nerve deficit.  Skin: Skin is warm. No rash noted. Nails show no clubbing.  Psychiatric: She has a normal mood and affect.      Data Reviewed: Basic Metabolic Panel:  Recent Labs Lab 11/09/15 1346  NA 139  K 3.7  CL 101  CO2 27  GLUCOSE 128*  BUN 18  CREATININE 1.11*  CALCIUM 9.0   Liver Function Tests:  Recent Labs Lab 11/09/15 1346  AST 31  ALT 16  ALKPHOS 55  BILITOT 1.2  PROT 7.1  ALBUMIN 4.2   CBC:  Recent Labs Lab 11/09/15 1346  WBC 10.8  NEUTROABS 8.1*  HGB 10.8*  HCT 32.4*  MCV 99.1  PLT 150   Cardiac Enzymes:  Recent Labs Lab 11/09/15 1346  TROPONINI 0.04*     Recent Results (from the past 240 hour(s))  Rapid Influenza A&B Antigens ([place] only)     Status: None   Collection Time: 11/09/15  2:50 PM  Result Value Ref Range Status   Influenza A ([facility]) NEGATIVE NEGATIVE Final   Influenza B ([facility]) NEGATIVE NEGATIVE Final     Studies: Dg Chest 2 View  11/09/2015  CLINICAL DATA:  Weakness and coughing for 2 days. Altered mental status. EXAM: CHEST  2 VIEW COMPARISON:  Chest x-ray dated 10/24/2013 FINDINGS: Heart size is upper normal, stable. Overall cardiomediastinal silhouette is stable in size and configuration. Left  chest wall pacemaker/AICD is stable in position. Subtle airspace opacities are seen within the right upper lobe, suspicious for early developing pneumonia. Lungs otherwise clear. No pleural effusion. Osseous structures about the chest are unremarkable. IMPRESSION: Subtle airspace opacities within the right upper lobe, suspicious for early developing pneumonia. Electronically Signed   By: Franki Cabot M.D.   On: 11/09/2015 14:21   Ct Head Wo Contrast  11/09/2015  CLINICAL DATA:  Pt bib EMS w/ c/o alt mental status. Per EMS family called out and stated that pt "was not acting right". Pt sts  she has been feeling weak and attributes it to being outside in the wind and cough. EXAM: CT HEAD WITHOUT CONTRAST TECHNIQUE: Contiguous axial images were obtained from the base of the skull through the vertex without intravenous contrast. COMPARISON:  PET-CT 06/05/2010 FINDINGS: There is central and cortical atrophy. Periventricular white matter changes are consistent with small vessel disease. There is no intra or extra-axial fluid collection or mass lesion. The basilar cisterns and ventricles have a normal appearance. There is no CT evidence for acute infarction or hemorrhage. Bone windows are unremarkable. IMPRESSION: Atrophy and small vessel disease. No evidence for acute intracranial abnormality. Electronically Signed   By: Nolon Nations M.D.   On: 11/09/2015 14:34    Scheduled Meds: . ALPRAZolam  0.25 mg Oral QHS  . azithromycin  500 mg Intravenous Q24H  . carvedilol  3.125 mg Oral BID WC  . cefTRIAXone (ROCEPHIN)  IV  1 g Intravenous Q24H  . cholecalciferol  1,000 Units Oral BID  . citalopram  10 mg Oral Daily  . ferrous sulfate  325 mg Oral Q breakfast  . gabapentin  200 mg Oral TID  . losartan  50 mg Oral Daily  . simvastatin  10 mg Oral Daily  . sodium chloride flush  3 mL Intravenous Q12H  . warfarin  5 mg Oral q1800  . Warfarin - Physician Dosing Inpatient   Does not apply q1800   Assessment/Plan:  1. Pneumonia right upper lobe and fever. Would like to see temperature curve come down prior to discharge home. Patient to her going on Rocephin and Zithromax. 2. Elevated troponin likely secondary to pneumonia. Order another troponin tomorrow morning since it wasn't done serially today. No complaints of chest pain. 3. History of atrial fibrillation on Coumadin. Pharmacy to dose 4. Essential hypertension continue usual medications 5. Anxiety depression continue psychiatric medications 6. Hyperlipidemia unspecified continue simvastatin 7. Neuropathy on gabapentin 8. Physical  therapy evaluation  Code Status:     Code Status Orders        Start     Ordered   11/09/15 1716  Full code   Continuous     11/09/15 1715    Code Status History    Date Active Date Inactive Code Status Order ID Comments User Context   02/10/2015  5:17 PM 02/16/2015 10:42 AM Full Code [id number]  Erline Levine, MD Inpatient    Advance Directive Documentation        Most Recent Value   Type of Advance Directive  Living will   Pre-existing out of facility DNR order (yellow form or pink MOST form)     "MOST" Form in Place?       Disposition Plan: Potentially home tomorrow  Antibiotics:  Rocephin  Zithromax  Time spent: 25 minutes  Loletha Grayer  [facility]

## 2015-11-10 NOTE — Consult Note (Signed)
ANTICOAGULATION CONSULT NOTE - Initial Consult  Pharmacy Consult for warfarin Indication: atrial fibrillation  Allergies  Allergen Reactions  . Naproxen     Other reaction(s): Other (See Comments) GI upset  . Sucralfate     Other reaction(s): Other (See Comments) Abdominal bloating  . Prednisone Anxiety    Shakes    Patient Measurements: Height: 5\' 4"  (162.6 cm) Weight: 127 lb 8 oz (57.834 kg) IBW/kg (Calculated) : 54.7 Heparin Dosing Weight:   Vital Signs: Temp: 98.6 F (37 C) (03/10 0750) Temp Source: Oral (03/10 0750) BP: 157/86 mmHg (03/10 0750) Pulse Rate: 72 (03/10 0750)  Labs:  Recent Labs  11/09/15 1346 11/10/15 0920  HGB 10.8*  --   HCT 32.4*  --   PLT 150  --   APTT 43*  --   LABPROT 20.0* 21.7*  INR 1.70 1.90  CREATININE 1.11*  --   TROPONINI 0.04*  --     Estimated Creatinine Clearance: 35.5 mL/min (by C-G formula based on Cr of 1.11).   Medical History: Past Medical History  Diagnosis Date  . Presence of permanent cardiac pacemaker   . Hypertension   . Heart murmur   . Depression   . Anxiety   . GERD (gastroesophageal reflux disease)   . Arthritis   . Anemia   . Dysrhythmia     CHB s/p PPM, afib history  . Cancer [facility]) 2011    rt breast  . Breast cancer ([place]) 2011    rt- radiation    Medications:  Scheduled:  . ALPRAZolam  0.25 mg Oral QHS  . azithromycin  500 mg Intravenous Q24H  . carvedilol  3.125 mg Oral BID WC  . cefTRIAXone (ROCEPHIN)  IV  1 g Intravenous Q24H  . cholecalciferol  1,000 Units Oral BID  . citalopram  10 mg Oral Daily  . ferrous sulfate  325 mg Oral Q breakfast  . gabapentin  200 mg Oral TID  . losartan  50 mg Oral Daily  . simvastatin  10 mg Oral Daily  . sodium chloride flush  3 mL Intravenous Q12H  . warfarin  5 mg Oral QPM  . Warfarin - Physician Dosing Inpatient   Does not apply q1800    Assessment: Pt is a 82 year old female with PMH of afib, on warfarin. Home does of warfarin is 5mg  daily. On  admission INR was 1.7. This morning it is 1.9  Goal of Therapy:  INR 2-3 Monitor platelets by anticoagulation protocol: Yes   Plan:  Will continue patient home dose of wafarin since INR is close to therapeutic. If still subtherapeutic tomorrow, may consider a boost with 7.5mg . Check INR in the AM.  Gasper Hopes D Aswad Wandrey 11/10/2015,2:51 PM

## 2015-11-11 LAB — BASIC METABOLIC PANEL
Anion gap: 7 (ref 5–15)
BUN: 20 mg/dL (ref 6–20)
CALCIUM: 8.4 mg/dL — AB (ref 8.9–10.3)
CO2: 27 mmol/L (ref 22–32)
Chloride: 104 mmol/L (ref 101–111)
Creatinine, Ser: 1.01 mg/dL — ABNORMAL HIGH (ref 0.44–1.00)
GFR calc Af Amer: 60 mL/min — ABNORMAL LOW (ref >60)
GFR, EST NON AFRICAN AMERICAN: 52 mL/min — AB (ref >60)
GLUCOSE: 102 mg/dL — AB (ref 65–99)
Potassium: 3.7 mmol/L (ref 3.5–5.1)
Sodium: 138 mmol/L (ref 135–145)

## 2015-11-11 LAB — CBC
HCT: 28 % — ABNORMAL LOW (ref 35.0–47.0)
HEMOGLOBIN: 9.4 g/dL — AB (ref 12.0–16.0)
MCH: 32.6 pg (ref 26.0–34.0)
MCHC: 33.6 g/dL (ref 32.0–36.0)
MCV: 97.2 fL (ref 80.0–100.0)
Platelets: 138 10*3/uL — ABNORMAL LOW (ref 150–440)
RBC: 2.88 MIL/uL — ABNORMAL LOW (ref 3.80–5.20)
RDW: 13.8 % (ref 11.5–14.5)
WBC: 7.9 10*3/uL (ref 3.6–11.0)

## 2015-11-11 LAB — PROTIME-INR
INR: 1.88
PROTHROMBIN TIME: 21.5 s — AB (ref 11.4–15.0)

## 2015-11-11 LAB — TROPONIN I: TROPONIN I: 0.04 ng/mL — AB (ref <0.031)

## 2015-11-11 MED ORDER — AZITHROMYCIN 500 MG PO TABS: 500.0000 mg | ORAL_TABLET | Freq: Every day | ORAL | Status: DC

## 2015-11-11 MED ORDER — AZITHROMYCIN 500 MG PO TABS: 500.0000 mg | ORAL_TABLET | Freq: Every day | ORAL | Status: AC

## 2015-11-11 MED ORDER — CEFUROXIME AXETIL 500 MG PO TABS: 500.0000 mg | ORAL_TABLET | Freq: Two times a day (BID) | ORAL | Status: AC

## 2015-11-11 MED ORDER — WARFARIN SODIUM 3 MG PO TABS: 6.0000 mg | ORAL_TABLET | Freq: Once | ORAL | Status: DC

## 2015-11-11 MED ORDER — CEFUROXIME AXETIL 500 MG PO TABS: 500.0000 mg | ORAL_TABLET | Freq: Two times a day (BID) | ORAL | Status: DC

## 2015-11-11 NOTE — Discharge Summary (Signed)
Sally Carroll, 82 y.o., DOB 22-Dec-1933, MRN [id number]. Admission date: 11/09/2015 Discharge Date 11/11/2015 Primary MD Tracie Harrier, MD Admitting Physician Demetrios Loll, MD  Admission Diagnosis  Confusion [R41.0] Community acquired pneumonia [J18.9] Troponin level elevated [R79.89]  Discharge Diagnosis   Principal Problem:   Pneumonia  acute encephalopathy due to pneumonia Elevated troponin due to demand ischemia Hypertension Depression Anxiety GERD Chronic kidney History of dysrhythmia History of breast cancer        Hospital Course Sally Carroll is a 82 y.o. female with a known history of hypertension, anemia and breast cancer. The patient was sent to ED from home due to confusion, cough and shortness of breath. Patient's chest x-ray showed right lower lobe pneumonia. She was treated with antibiotics with her mental status normalizing. Her breathing is stable. She denies any cough or shortness of breath.            Consults  None  Significant Tests:  See full reports for all details    Dg Chest 2 View  11/09/2015  CLINICAL DATA:  Weakness and coughing for 2 days. Altered mental status. EXAM: CHEST  2 VIEW COMPARISON:  Chest x-ray dated 10/24/2013 FINDINGS: Heart size is upper normal, stable. Overall cardiomediastinal silhouette is stable in size and configuration. Left chest wall pacemaker/AICD is stable in position. Subtle airspace opacities are seen within the right upper lobe, suspicious for early developing pneumonia. Lungs otherwise clear. No pleural effusion. Osseous structures about the chest are unremarkable. IMPRESSION: Subtle airspace opacities within the right upper lobe, suspicious for early developing pneumonia. Electronically Signed   By: Franki Cabot M.D.   On: 11/09/2015 14:21   Ct Head Wo Contrast  11/09/2015  CLINICAL DATA:  Pt bib EMS w/ c/o alt mental status. Per EMS family called out and stated that pt "was not acting right". Pt sts she has been  feeling weak and attributes it to being outside in the wind and cough. EXAM: CT HEAD WITHOUT CONTRAST TECHNIQUE: Contiguous axial images were obtained from the base of the skull through the vertex without intravenous contrast. COMPARISON:  PET-CT 06/05/2010 FINDINGS: There is central and cortical atrophy. Periventricular white matter changes are consistent with small vessel disease. There is no intra or extra-axial fluid collection or mass lesion. The basilar cisterns and ventricles have a normal appearance. There is no CT evidence for acute infarction or hemorrhage. Bone windows are unremarkable. IMPRESSION: Atrophy and small vessel disease. No evidence for acute intracranial abnormality. Electronically Signed   By: Nolon Nations M.D.   On: 11/09/2015 14:34       Today   Subjective:   Sally Carroll  feels well breathing stable no shortness of breath or cough very anxious to go home  Objective:   Blood pressure 143/93, pulse 73, temperature 97.8 F (36.6 C), temperature source Oral, resp. rate 18, height 5\' 4"  (1.626 m), weight 57.834 kg (127 lb 8 oz), SpO2 100 %.  .  Intake/Output Summary (Last 24 hours) at 11/11/15 1427 Last data filed at 11/11/15 0945  Gross per 24 hour  Intake    240 ml  Output      0 ml  Net    240 ml    Exam VITAL SIGNS: Blood pressure 143/93, pulse 73, temperature 97.8 F (36.6 C), temperature source Oral, resp. rate 18, height 5\' 4"  (1.626 m), weight 57.834 kg (127 lb 8 oz), SpO2 100 %.  GENERAL:  82 y.o.-year-old patient lying in the bed with no acute distress.  EYES: Pupils equal, round, reactive to light and accommodation. No scleral icterus. Extraocular muscles intact.  HEENT: Head atraumatic, normocephalic. Oropharynx and nasopharynx clear.  NECK:  Supple, no jugular venous distention. No thyroid enlargement, no tenderness.  LUNGS: Normal breath sounds bilaterally, no wheezing, rales,rhonchi or crepitation. No use of accessory muscles of respiration.   CARDIOVASCULAR: S1, S2 normal. No murmurs, rubs, or gallops.  ABDOMEN: Soft, nontender, nondistended. Bowel sounds present. No organomegaly or mass.  EXTREMITIES: No pedal edema, cyanosis, or clubbing.  NEUROLOGIC: Cranial nerves II through XII are intact. Muscle strength 5/5 in all extremities. Sensation intact. Gait not checked.  PSYCHIATRIC: The patient is alert and oriented x 3.  SKIN: No obvious rash, lesion, or ulcer.   Data Review     CBC w Diff: Lab Results  Component Value Date   WBC 7.9 11/11/2015   WBC 5.7 12/25/2014   HGB 9.4* 11/11/2015   HGB 11.9* 12/25/2014   HCT 28.0* 11/11/2015   HCT 34.4* 12/25/2014   PLT 138* 11/11/2015   PLT 221 12/25/2014   LYMPHOPCT 14 11/09/2015   LYMPHOPCT 36.1 08/16/2014   MONOPCT 11 11/09/2015   MONOPCT 9.6 08/16/2014   EOSPCT 0 11/09/2015   EOSPCT 1.3 08/16/2014   BASOPCT 0 11/09/2015   BASOPCT 0.6 08/16/2014   CMP: Lab Results  Component Value Date   NA 138 11/11/2015   NA 139 12/25/2014   K 3.7 11/11/2015   K 3.8 12/25/2014   CL 104 11/11/2015   CL 103 12/25/2014   CO2 27 11/11/2015   CO2 25 12/25/2014   BUN 20 11/11/2015   BUN 20 12/25/2014   CREATININE 1.01* 11/11/2015   CREATININE 1.07* 12/25/2014   PROT 7.1 11/09/2015   PROT 7.1 12/25/2014   ALBUMIN 4.2 11/09/2015   ALBUMIN 4.6 12/25/2014   BILITOT 1.2 11/09/2015   BILITOT 0.4 12/25/2014   ALKPHOS 55 11/09/2015   ALKPHOS 42 12/25/2014   AST 31 11/09/2015   AST 40 12/25/2014   ALT 16 11/09/2015   ALT 29 12/25/2014  .  Micro Results Recent Results (from the past 240 hour(s))  Rapid Influenza A&B Antigens ([facility] only)     Status: None   Collection Time: 11/09/15  2:50 PM  Result Value Ref Range Status   Influenza A ([facility]) NEGATIVE NEGATIVE Final   Influenza B ([facility]) NEGATIVE NEGATIVE Final  Blood culture (routine x 2)     Status: None (Preliminary result)   Collection Time: 11/09/15  2:50 PM  Result Value Ref Range Status   Specimen Description BLOOD  LARM  Final   Special Requests BOTTLES DRAWN AEROBIC AND ANAEROBIC 1CC  Final   Culture NO GROWTH 2 DAYS  Final   Report Status PENDING  Incomplete  Blood culture (routine x 2)     Status: None (Preliminary result)   Collection Time: 11/09/15  2:50 PM  Result Value Ref Range Status   Specimen Description BLOOD LT ARM  Final   Special Requests BOTTLES DRAWN AEROBIC AND ANAEROBIC 1CC  Final   Culture NO GROWTH 2 DAYS  Final   Report Status PENDING  Incomplete  Culture, sputum-assessment     Status: None   Collection Time: 11/09/15  4:57 PM  Result Value Ref Range Status   Specimen Description SPUTUM  Final   Special Requests NONE  Final   Sputum evaluation   Final    Sputum specimen not acceptable for testing.  Please recollect.   Results Called to: Verdene Rio @ [phone]  11/10/15 by [facility]    Report Status 11/10/2015 FINAL  Final        Code Status Orders        Start     Ordered   11/09/15 1716  Full code   Continuous     11/09/15 1715    Code Status History    Date Active Date Inactive Code Status Order ID Comments User Context   02/10/2015  5:17 PM 02/16/2015 10:42 AM Full Code [id number]  Erline Levine, MD Inpatient    Advance Directive Documentation        Most Recent Value   Type of Advance Directive  Living will   Pre-existing out of facility DNR order (yellow form or pink MOST form)     "MOST" Form in Place?            Follow-up Information    Follow up with [facility], MD In 5 days.   Specialty:  Internal Medicine   Contact information:   [address] [place] [address] [phone]       Discharge Medications     Medication List    TAKE these medications        ALPRAZolam 0.25 MG tablet  Commonly known as:  XANAX  Take 0.25 mg by mouth at bedtime.     azithromycin 500 MG tablet  Commonly known as:  ZITHROMAX  Take 1 tablet (500 mg total) by mouth daily.     Biotin 2500 MCG Caps  Take 5,000 mcg by mouth  daily.     carbidopa-levodopa 25-100 MG tablet  Commonly known as:  SINEMET IR  Take 1-2 tablets by mouth 3 (three) times daily. Takes 1 tablet at 1800 and 2 tablets at bedtime. May take an additionall tablet if needed     carvedilol 6.25 MG tablet  Commonly known as:  COREG  Take 3.125 mg by mouth 2 (two) times daily with a meal.     cefUROXime 500 MG tablet  Commonly known as:  CEFTIN  Take 1 tablet (500 mg total) by mouth 2 (two) times daily with a meal.     cholecalciferol 1000 units tablet  Commonly known as:  VITAMIN D  Take 1,000 Units by mouth 2 (two) times daily.     citalopram 10 MG tablet  Commonly known as:  CELEXA  Take 10 mg by mouth daily.     ferrous sulfate 325 (65 FE) MG tablet  Take 325 mg by mouth daily with breakfast.     gabapentin 100 MG capsule  Commonly known as:  NEURONTIN  Take 200 mg by mouth 3 (three) times daily.     HYDROcodone-acetaminophen 10-325 MG tablet  Commonly known as:  NORCO  Take 1 tablet by mouth 3 (three) times daily.     losartan 100 MG tablet  Commonly known as:  COZAAR  Take 100 mg by mouth daily.     losartan 50 MG tablet  Commonly known as:  COZAAR  Take 50 mg by mouth daily.     multivitamin with minerals tablet  Take 1 tablet by mouth daily.     PRESERVISION AREDS Caps  Take 1 capsule by mouth 2 (two) times daily.     omeprazole 20 MG capsule  Commonly known as:  PRILOSEC  Take 20 mg by mouth daily. Reported on 11/09/2015     sennosides-docusate sodium 8.6-50 MG tablet  Commonly known as:  SENOKOT-S  Take 1 tablet by mouth daily.  simvastatin 20 MG tablet  Commonly known as:  ZOCOR  Take 10 mg by mouth daily.     traZODone 100 MG tablet  Commonly known as:  DESYREL  Take 100 mg by mouth at bedtime.     warfarin 5 MG tablet  Commonly known as:  COUMADIN  Take 5 mg by mouth every evening.     warfarin 4 MG tablet  Commonly known as:  COUMADIN  Take by mouth.           Total Time in preparing  paper work, data evaluation and todays exam - 35 minutes  Dustin Flock M.D on 11/11/2015 at 2:27 PM  [facility]   Office  [phone]

## 2015-11-11 NOTE — Progress Notes (Signed)
Patient discharged to home. Friend is picking her up. Belongings packed, iv removed. Discharge & Rx instructions given. VSS at time of discharge.

## 2015-11-11 NOTE — Discharge Instructions (Signed)
°  DIET:  °Cardiac diet ° °DISCHARGE CONDITION:  °Good ° °ACTIVITY:  °Activity as tolerated ° °OXYGEN:  °Home Oxygen: No. °  °Oxygen Delivery: room air ° °DISCHARGE LOCATION:  °home  ° ° °ADDITIONAL DISCHARGE INSTRUCTION: ° ° °If you experience worsening of your admission symptoms, develop shortness of breath, life threatening emergency, suicidal or homicidal thoughts you must seek medical attention immediately by calling 911 or calling your MD immediately  if symptoms less severe. ° °You Must read complete instructions/literature along with all the possible adverse reactions/side effects for all the Medicines you take and that have been prescribed to you. Take any new Medicines after you have completely understood and accpet all the possible adverse reactions/side effects.  ° °Please note ° °You were cared for by a hospitalist during your hospital stay. If you have any questions about your discharge medications or the care you received while you were in the hospital after you are discharged, you can call the unit and asked to speak with the hospitalist on call if the hospitalist that took care of you is not available. Once you are discharged, your primary care physician will handle any further medical issues. Please note that NO REFILLS for any discharge medications will be authorized once you are discharged, as it is imperative that you return to your primary care physician (or establish a relationship with a primary care physician if you do not have one) for your aftercare needs so that they can reassess your need for medications and monitor your lab values. ° ° °

## 2015-11-11 NOTE — Consult Note (Signed)
ANTICOAGULATION CONSULT NOTE - Initial Consult  Pharmacy Consult for warfarin Indication: atrial fibrillation  Allergies  Allergen Reactions  . Naproxen     Other reaction(s): Other (See Comments) GI upset  . Sucralfate     Other reaction(s): Other (See Comments) Abdominal bloating  . Prednisone Anxiety    Shakes    Patient Measurements: Height: 5\' 4"  (162.6 cm) Weight: 127 lb 8 oz (57.834 kg) IBW/kg (Calculated) : 54.7 Heparin Dosing Weight:   Vital Signs: Temp: 98.6 F (37 C) (03/11 0729) Temp Source: Oral (03/11 0729) BP: 137/63 mmHg (03/11 0729) Pulse Rate: 70 (03/11 0729)  Labs:  Recent Labs  11/09/15 1346 11/10/15 0920 11/11/15 0330  HGB 10.8*  --  9.4*  HCT 32.4*  --  28.0*  PLT 150  --  138*  APTT 43*  --   --   LABPROT 20.0* 21.7* 21.5*  INR 1.70 1.90 1.88  CREATININE 1.11*  --  1.01*  TROPONINI 0.04*  --  0.04*    Estimated Creatinine Clearance: 39 mL/min (by C-G formula based on Cr of 1.01).   Medical History: Past Medical History  Diagnosis Date  . Presence of permanent cardiac pacemaker   . Hypertension   . Heart murmur   . Depression   . Anxiety   . GERD (gastroesophageal reflux disease)   . Arthritis   . Anemia   . Dysrhythmia     CHB s/p PPM, afib history  . Cancer [facility]) 2011    rt breast  . Breast cancer ([place]) 2011    rt- radiation    Medications:  Scheduled:  . ALPRAZolam  0.25 mg Oral QHS  . azithromycin  500 mg Intravenous Q24H  . carvedilol  3.125 mg Oral BID WC  . cefTRIAXone (ROCEPHIN)  IV  1 g Intravenous Q24H  . cholecalciferol  1,000 Units Oral BID  . citalopram  10 mg Oral Daily  . ferrous sulfate  325 mg Oral Q breakfast  . gabapentin  200 mg Oral TID  . losartan  50 mg Oral Daily  . simvastatin  10 mg Oral Daily  . sodium chloride flush  3 mL Intravenous Q12H  . warfarin  5 mg Oral q1800  . Warfarin - Physician Dosing Inpatient   Does not apply q1800    Assessment: Pt is a 82 year old female with PMH  of afib, on warfarin. Home does of warfarin is 5mg  daily. On admission INR was 1.7.  3/10:1.9 3/11: 1.88  Goal of Therapy:  INR 2-3 Monitor platelets by anticoagulation protocol: Yes   Plan:  INR today subtherapeutic at 1.88, yesterday was 1.9. Patient is almost within goal. Will give patient 1 time dose of 6mg .  Check INR in the AM.  Nancy Fetter, PharmD Pharmacy Resident  11/11/2015,10:15 AM

## 2015-11-11 NOTE — Progress Notes (Signed)
Physical Therapy Evaluation Patient Details Name: JANIE GILKERSON MRN: [id number] DOB: 23-Dec-1933 Today's Date: 11/11/2015   History of Present Illness  Patient is a 82 y.o. female admmitted on 09 November 2015 for increased confusion, coughing, and SOB. Was found to have R lower lobe pneumonia. PMH includes HTN, anemia, and breast CA.  Clinical Impression  Patient is a pleasant 82 y.o. Female who is reportedly previously independent in all aspects. Upon evaluation, patient presents at baseline level of function, demonstrating no deficits in ROM, strength, balance, gait, or function. Patient does not require continued PT services at this time and is safe to return home when medically stable.    Follow Up Recommendations No PT follow up    Equipment Recommendations  None recommended by PT    Recommendations for Other Services       Precautions / Restrictions Precautions Precautions: Fall Restrictions Weight Bearing Restrictions: No      Mobility  Bed Mobility Overal bed mobility: Independent                Transfers Overall transfer level: Independent Equipment used: 1 person hand held assist                Ambulation/Gait Ambulation/Gait assistance: Min guard Ambulation Distance (Feet): 350 Feet Assistive device: 1 person hand held assist       General Gait Details: Patient ambulates at decreased cadence using HHA on R. Able to converse with no SOB, performing head turns with no LOB.  Stairs            Wheelchair Mobility    Modified Rankin (Stroke Patients Only)       Balance Overall balance assessment: Independent                                           Pertinent Vitals/Pain Pain Assessment: No/denies pain    Home Living Family/patient expects to be discharged to:: Private residence Living Arrangements: Alone Available Help at Discharge: Friend(s);Available PRN/intermittently Type of Home: House Home Access:  Stairs to enter Entrance Stairs-Rails: None Entrance Stairs-Number of Steps: 1 Home Layout: One level Home Equipment: Other (comment) (Has various walkers/canes)      Prior Function Level of Independence: Independent               Hand Dominance        Extremity/Trunk Assessment   Upper Extremity Assessment: Overall WFL for tasks assessed           Lower Extremity Assessment: Overall WFL for tasks assessed         Communication   Communication: No difficulties;HOH  Cognition Arousal/Alertness: Awake/alert Behavior During Therapy: WFL for tasks assessed/performed Overall Cognitive Status: Within Functional Limits for tasks assessed                      General Comments      Exercises        Assessment/Plan    PT Assessment Patent does not need any further PT services  PT Diagnosis Generalized weakness   PT Problem List    PT Treatment Interventions     PT Goals (Current goals can be found in the Care Plan section) Acute Rehab PT Goals Patient Stated Goal: "To go home" PT Goal Formulation: With patient Time For Goal Achievement: 11/25/15 Potential to Achieve Goals: Good    Frequency  Barriers to discharge        Co-evaluation               End of Session Equipment Utilized During Treatment: Gait belt Activity Tolerance: Patient tolerated treatment well Patient left: in bed;with call bell/phone within reach;with bed alarm set Nurse Communication: Mobility status         Time: 0940-1000 PT Time Calculation (min) (ACUTE ONLY): 20 min   Charges:   PT Evaluation $PT Eval Low Complexity: 1 Procedure     PT G Codes:        Dorice Lamas, PT, DPT 11/11/2015, 10:55 AM

## 2015-11-14 LAB — CULTURE, BLOOD (ROUTINE X 2)
CULTURE: NO GROWTH
CULTURE: NO GROWTH

## 2016-05-27 DIAGNOSIS — M47816 Spondylosis without myelopathy or radiculopathy, lumbar region: Secondary | ICD-10-CM | POA: Insufficient documentation

## 2016-06-27 ENCOUNTER — Other Ambulatory Visit: Payer: Self-pay | Admitting: Internal Medicine

## 2016-06-27 DIAGNOSIS — Z1231 Encounter for screening mammogram for malignant neoplasm of breast: Secondary | ICD-10-CM

## 2016-07-03 ENCOUNTER — Other Ambulatory Visit: Payer: Self-pay | Admitting: Internal Medicine

## 2016-07-03 DIAGNOSIS — M79659 Pain in unspecified thigh: Secondary | ICD-10-CM

## 2016-07-03 DIAGNOSIS — M79606 Pain in leg, unspecified: Secondary | ICD-10-CM

## 2016-07-03 DIAGNOSIS — Z0000 Encounter for general adult medical examination without abnormal findings: Secondary | ICD-10-CM

## 2016-07-03 DIAGNOSIS — I10 Essential (primary) hypertension: Secondary | ICD-10-CM

## 2016-07-03 DIAGNOSIS — R413 Other amnesia: Secondary | ICD-10-CM

## 2016-07-11 ENCOUNTER — Ambulatory Visit: Payer: Medicare PPO

## 2016-07-29 DIAGNOSIS — M7582 Other shoulder lesions, left shoulder: Secondary | ICD-10-CM | POA: Insufficient documentation

## 2016-08-08 ENCOUNTER — Ambulatory Visit
Admission: RE | Admit: 2016-08-08 | Discharge: 2016-08-08 | Disposition: A | Discharge status: Home / Self Care | Payer: Medicare PPO | Source: Ambulatory Visit | Attending: Internal Medicine | Admitting: Internal Medicine

## 2016-08-08 DIAGNOSIS — Z853 Personal history of malignant neoplasm of breast: Secondary | ICD-10-CM | POA: Diagnosis not present

## 2016-08-08 DIAGNOSIS — Z9889 Other specified postprocedural states: Secondary | ICD-10-CM | POA: Diagnosis not present

## 2016-08-08 DIAGNOSIS — Z1231 Encounter for screening mammogram for malignant neoplasm of breast: Secondary | ICD-10-CM

## 2016-09-14 DIAGNOSIS — G2581 Restless legs syndrome: Secondary | ICD-10-CM | POA: Diagnosis not present

## 2016-09-14 DIAGNOSIS — M150 Primary generalized (osteo)arthritis: Secondary | ICD-10-CM | POA: Diagnosis not present

## 2016-09-14 DIAGNOSIS — I10 Essential (primary) hypertension: Secondary | ICD-10-CM | POA: Diagnosis not present

## 2016-09-14 DIAGNOSIS — I4891 Unspecified atrial fibrillation: Secondary | ICD-10-CM | POA: Diagnosis not present

## 2016-09-14 DIAGNOSIS — F419 Anxiety disorder, unspecified: Secondary | ICD-10-CM | POA: Diagnosis not present

## 2016-09-14 DIAGNOSIS — E785 Hyperlipidemia, unspecified: Secondary | ICD-10-CM | POA: Diagnosis not present

## 2016-09-14 DIAGNOSIS — D649 Anemia, unspecified: Secondary | ICD-10-CM | POA: Diagnosis not present

## 2016-09-14 DIAGNOSIS — M47816 Spondylosis without myelopathy or radiculopathy, lumbar region: Secondary | ICD-10-CM | POA: Diagnosis not present

## 2016-09-14 DIAGNOSIS — F329 Major depressive disorder, single episode, unspecified: Secondary | ICD-10-CM | POA: Diagnosis not present

## 2016-09-23 ENCOUNTER — Other Ambulatory Visit
Admission: RE | Admit: 2016-09-23 | Discharge: 2016-09-23 | Disposition: A | Discharge status: Home / Self Care | Payer: Medicare HMO | Source: Ambulatory Visit | Attending: Internal Medicine | Admitting: Internal Medicine

## 2016-09-23 DIAGNOSIS — E785 Hyperlipidemia, unspecified: Secondary | ICD-10-CM | POA: Diagnosis not present

## 2016-09-23 DIAGNOSIS — F329 Major depressive disorder, single episode, unspecified: Secondary | ICD-10-CM | POA: Diagnosis not present

## 2016-09-23 DIAGNOSIS — R309 Painful micturition, unspecified: Secondary | ICD-10-CM | POA: Insufficient documentation

## 2016-09-23 DIAGNOSIS — M150 Primary generalized (osteo)arthritis: Secondary | ICD-10-CM | POA: Diagnosis not present

## 2016-09-23 DIAGNOSIS — G2581 Restless legs syndrome: Secondary | ICD-10-CM | POA: Diagnosis not present

## 2016-09-23 DIAGNOSIS — I10 Essential (primary) hypertension: Secondary | ICD-10-CM | POA: Diagnosis not present

## 2016-09-23 DIAGNOSIS — I4891 Unspecified atrial fibrillation: Secondary | ICD-10-CM | POA: Diagnosis not present

## 2016-09-23 DIAGNOSIS — M47816 Spondylosis without myelopathy or radiculopathy, lumbar region: Secondary | ICD-10-CM | POA: Diagnosis not present

## 2016-09-23 DIAGNOSIS — D649 Anemia, unspecified: Secondary | ICD-10-CM | POA: Diagnosis not present

## 2016-09-23 DIAGNOSIS — F419 Anxiety disorder, unspecified: Secondary | ICD-10-CM | POA: Diagnosis not present

## 2016-09-23 LAB — URINALYSIS, COMPLETE (UACMP) WITH MICROSCOPIC
BACTERIA UA: NONE SEEN
Bilirubin Urine: NEGATIVE
Glucose, UA: NEGATIVE mg/dL
Hgb urine dipstick: NEGATIVE
Ketones, ur: NEGATIVE mg/dL
NITRITE: NEGATIVE
Protein, ur: 30 mg/dL — AB
SPECIFIC GRAVITY, URINE: 1.026 (ref 1.005–1.030)
pH: 5 (ref 5.0–8.0)

## 2016-09-25 LAB — URINE CULTURE

## 2016-09-28 DIAGNOSIS — I4891 Unspecified atrial fibrillation: Secondary | ICD-10-CM | POA: Diagnosis not present

## 2016-09-28 DIAGNOSIS — F329 Major depressive disorder, single episode, unspecified: Secondary | ICD-10-CM | POA: Diagnosis not present

## 2016-09-28 DIAGNOSIS — D649 Anemia, unspecified: Secondary | ICD-10-CM | POA: Diagnosis not present

## 2016-09-28 DIAGNOSIS — M150 Primary generalized (osteo)arthritis: Secondary | ICD-10-CM | POA: Diagnosis not present

## 2016-09-28 DIAGNOSIS — F419 Anxiety disorder, unspecified: Secondary | ICD-10-CM | POA: Diagnosis not present

## 2016-09-28 DIAGNOSIS — I10 Essential (primary) hypertension: Secondary | ICD-10-CM | POA: Diagnosis not present

## 2016-09-28 DIAGNOSIS — E785 Hyperlipidemia, unspecified: Secondary | ICD-10-CM | POA: Diagnosis not present

## 2016-09-28 DIAGNOSIS — M47816 Spondylosis without myelopathy or radiculopathy, lumbar region: Secondary | ICD-10-CM | POA: Diagnosis not present

## 2016-09-28 DIAGNOSIS — G2581 Restless legs syndrome: Secondary | ICD-10-CM | POA: Diagnosis not present

## 2016-10-03 DIAGNOSIS — I10 Essential (primary) hypertension: Secondary | ICD-10-CM | POA: Diagnosis not present

## 2016-10-03 DIAGNOSIS — G2581 Restless legs syndrome: Secondary | ICD-10-CM | POA: Diagnosis not present

## 2016-10-03 DIAGNOSIS — D649 Anemia, unspecified: Secondary | ICD-10-CM | POA: Diagnosis not present

## 2016-10-03 DIAGNOSIS — F419 Anxiety disorder, unspecified: Secondary | ICD-10-CM | POA: Diagnosis not present

## 2016-10-03 DIAGNOSIS — F329 Major depressive disorder, single episode, unspecified: Secondary | ICD-10-CM | POA: Diagnosis not present

## 2016-10-03 DIAGNOSIS — M47816 Spondylosis without myelopathy or radiculopathy, lumbar region: Secondary | ICD-10-CM | POA: Diagnosis not present

## 2016-10-03 DIAGNOSIS — E785 Hyperlipidemia, unspecified: Secondary | ICD-10-CM | POA: Diagnosis not present

## 2016-10-03 DIAGNOSIS — M150 Primary generalized (osteo)arthritis: Secondary | ICD-10-CM | POA: Diagnosis not present

## 2016-10-03 DIAGNOSIS — I4891 Unspecified atrial fibrillation: Secondary | ICD-10-CM | POA: Diagnosis not present

## 2016-10-09 DIAGNOSIS — R413 Other amnesia: Secondary | ICD-10-CM | POA: Diagnosis not present

## 2016-10-09 DIAGNOSIS — D649 Anemia, unspecified: Secondary | ICD-10-CM | POA: Diagnosis not present

## 2016-10-09 DIAGNOSIS — Z0000 Encounter for general adult medical examination without abnormal findings: Secondary | ICD-10-CM | POA: Diagnosis not present

## 2016-10-09 DIAGNOSIS — I10 Essential (primary) hypertension: Secondary | ICD-10-CM | POA: Diagnosis not present

## 2016-10-09 DIAGNOSIS — M79606 Pain in leg, unspecified: Secondary | ICD-10-CM | POA: Diagnosis not present

## 2016-10-09 DIAGNOSIS — Z950 Presence of cardiac pacemaker: Secondary | ICD-10-CM | POA: Diagnosis not present

## 2016-10-16 DIAGNOSIS — F0150 Vascular dementia without behavioral disturbance: Secondary | ICD-10-CM | POA: Diagnosis not present

## 2016-10-16 DIAGNOSIS — M1611 Unilateral primary osteoarthritis, right hip: Secondary | ICD-10-CM | POA: Diagnosis not present

## 2016-10-16 DIAGNOSIS — Z1211 Encounter for screening for malignant neoplasm of colon: Secondary | ICD-10-CM | POA: Diagnosis not present

## 2016-10-16 DIAGNOSIS — Z950 Presence of cardiac pacemaker: Secondary | ICD-10-CM | POA: Diagnosis not present

## 2016-10-16 DIAGNOSIS — D649 Anemia, unspecified: Secondary | ICD-10-CM | POA: Diagnosis not present

## 2016-10-16 DIAGNOSIS — Z0000 Encounter for general adult medical examination without abnormal findings: Secondary | ICD-10-CM | POA: Diagnosis not present

## 2016-10-16 DIAGNOSIS — I10 Essential (primary) hypertension: Secondary | ICD-10-CM | POA: Diagnosis not present

## 2016-10-16 DIAGNOSIS — I498 Other specified cardiac arrhythmias: Secondary | ICD-10-CM | POA: Diagnosis not present

## 2016-10-16 DIAGNOSIS — I480 Paroxysmal atrial fibrillation: Secondary | ICD-10-CM | POA: Diagnosis not present

## 2016-10-17 DIAGNOSIS — I10 Essential (primary) hypertension: Secondary | ICD-10-CM | POA: Diagnosis not present

## 2016-10-17 DIAGNOSIS — F329 Major depressive disorder, single episode, unspecified: Secondary | ICD-10-CM | POA: Diagnosis not present

## 2016-10-17 DIAGNOSIS — M47816 Spondylosis without myelopathy or radiculopathy, lumbar region: Secondary | ICD-10-CM | POA: Diagnosis not present

## 2016-10-17 DIAGNOSIS — F419 Anxiety disorder, unspecified: Secondary | ICD-10-CM | POA: Diagnosis not present

## 2016-10-17 DIAGNOSIS — E785 Hyperlipidemia, unspecified: Secondary | ICD-10-CM | POA: Diagnosis not present

## 2016-10-17 DIAGNOSIS — D649 Anemia, unspecified: Secondary | ICD-10-CM | POA: Diagnosis not present

## 2016-10-17 DIAGNOSIS — M150 Primary generalized (osteo)arthritis: Secondary | ICD-10-CM | POA: Diagnosis not present

## 2016-10-17 DIAGNOSIS — I4891 Unspecified atrial fibrillation: Secondary | ICD-10-CM | POA: Diagnosis not present

## 2016-10-21 DIAGNOSIS — Z1211 Encounter for screening for malignant neoplasm of colon: Secondary | ICD-10-CM | POA: Diagnosis not present

## 2016-10-21 DIAGNOSIS — F0150 Vascular dementia without behavioral disturbance: Secondary | ICD-10-CM | POA: Diagnosis not present

## 2016-10-21 DIAGNOSIS — Z0000 Encounter for general adult medical examination without abnormal findings: Secondary | ICD-10-CM | POA: Diagnosis not present

## 2016-10-21 DIAGNOSIS — D649 Anemia, unspecified: Secondary | ICD-10-CM | POA: Diagnosis not present

## 2016-10-21 DIAGNOSIS — I498 Other specified cardiac arrhythmias: Secondary | ICD-10-CM | POA: Diagnosis not present

## 2016-10-21 DIAGNOSIS — Z45018 Encounter for adjustment and management of other part of cardiac pacemaker: Secondary | ICD-10-CM | POA: Diagnosis not present

## 2016-10-21 DIAGNOSIS — M1611 Unilateral primary osteoarthritis, right hip: Secondary | ICD-10-CM | POA: Diagnosis not present

## 2016-10-21 DIAGNOSIS — I10 Essential (primary) hypertension: Secondary | ICD-10-CM | POA: Diagnosis not present

## 2016-10-21 DIAGNOSIS — I480 Paroxysmal atrial fibrillation: Secondary | ICD-10-CM | POA: Diagnosis not present

## 2016-10-21 DIAGNOSIS — Z950 Presence of cardiac pacemaker: Secondary | ICD-10-CM | POA: Diagnosis not present

## 2016-10-31 DIAGNOSIS — F0281 Dementia in other diseases classified elsewhere with behavioral disturbance: Secondary | ICD-10-CM | POA: Diagnosis not present

## 2016-10-31 DIAGNOSIS — F418 Other specified anxiety disorders: Secondary | ICD-10-CM | POA: Diagnosis not present

## 2016-10-31 DIAGNOSIS — G301 Alzheimer's disease with late onset: Secondary | ICD-10-CM | POA: Diagnosis not present

## 2016-10-31 DIAGNOSIS — I482 Chronic atrial fibrillation: Secondary | ICD-10-CM | POA: Diagnosis not present

## 2016-11-01 DIAGNOSIS — H353132 Nonexudative age-related macular degeneration, bilateral, intermediate dry stage: Secondary | ICD-10-CM | POA: Diagnosis not present

## 2016-11-01 DIAGNOSIS — F02818 Dementia in other diseases classified elsewhere, unspecified severity, with other behavioral disturbance: Secondary | ICD-10-CM | POA: Insufficient documentation

## 2016-11-01 DIAGNOSIS — G301 Alzheimer's disease with late onset: Secondary | ICD-10-CM

## 2016-11-01 DIAGNOSIS — F0281 Dementia in other diseases classified elsewhere with behavioral disturbance: Secondary | ICD-10-CM | POA: Insufficient documentation

## 2016-11-12 ENCOUNTER — Emergency Department
Admission: EM | Admit: 2016-11-12 | Discharge: 2016-11-12 | Disposition: A | Discharge status: Home / Self Care | Payer: Medicare HMO | Attending: Emergency Medicine | Admitting: Emergency Medicine

## 2016-11-12 ENCOUNTER — Emergency Department: Payer: Medicare HMO

## 2016-11-12 DIAGNOSIS — S92351A Displaced fracture of fifth metatarsal bone, right foot, initial encounter for closed fracture: Secondary | ICD-10-CM | POA: Diagnosis not present

## 2016-11-12 DIAGNOSIS — S92354A Nondisplaced fracture of fifth metatarsal bone, right foot, initial encounter for closed fracture: Secondary | ICD-10-CM | POA: Insufficient documentation

## 2016-11-12 DIAGNOSIS — Z950 Presence of cardiac pacemaker: Secondary | ICD-10-CM | POA: Diagnosis not present

## 2016-11-12 DIAGNOSIS — X501XXA Overexertion from prolonged static or awkward postures, initial encounter: Secondary | ICD-10-CM | POA: Diagnosis not present

## 2016-11-12 DIAGNOSIS — Z7901 Long term (current) use of anticoagulants: Secondary | ICD-10-CM | POA: Insufficient documentation

## 2016-11-12 DIAGNOSIS — Y999 Unspecified external cause status: Secondary | ICD-10-CM | POA: Insufficient documentation

## 2016-11-12 DIAGNOSIS — I10 Essential (primary) hypertension: Secondary | ICD-10-CM | POA: Diagnosis not present

## 2016-11-12 DIAGNOSIS — S82831A Other fracture of upper and lower end of right fibula, initial encounter for closed fracture: Secondary | ICD-10-CM | POA: Diagnosis not present

## 2016-11-12 DIAGNOSIS — Y929 Unspecified place or not applicable: Secondary | ICD-10-CM | POA: Diagnosis not present

## 2016-11-12 DIAGNOSIS — Y939 Activity, unspecified: Secondary | ICD-10-CM | POA: Insufficient documentation

## 2016-11-12 DIAGNOSIS — S8261XA Displaced fracture of lateral malleolus of right fibula, initial encounter for closed fracture: Secondary | ICD-10-CM | POA: Diagnosis not present

## 2016-11-12 DIAGNOSIS — Z853 Personal history of malignant neoplasm of breast: Secondary | ICD-10-CM | POA: Diagnosis not present

## 2016-11-12 DIAGNOSIS — S99921A Unspecified injury of right foot, initial encounter: Secondary | ICD-10-CM | POA: Diagnosis present

## 2016-11-12 MED ORDER — TRAMADOL HCL 50 MG PO TABS: 25.0000 mg | ORAL_TABLET | Freq: Four times a day (QID) | ORAL | 0 refills | Status: DC | PRN

## 2016-11-12 NOTE — ED Notes (Signed)

## 2016-11-12 NOTE — ED Provider Notes (Signed)
[facility] Provider Note   CSN: [id number] Arrival date & time: 11/12/16  2018     History   Chief Complaint Chief Complaint  Patient presents with  . Foot Pain    HPI Sally Carroll is a 83 y.o. female presents to the emergency department for evaluation of right foot and ankle pain. Patient rolled her right ankle getting out of the car just prior to arrival. She developed pain and swelling throughout the right foot and ankle. Patient felt a pop. She denies falling or causing any other injury to her body. She can ambulate but with severe pain to the right foot and ankle. She has not taken any medications for pain. She denies any hip or lower back pain.  HPI  Past Medical History:  Diagnosis Date  . Anemia   . Anxiety   . Arthritis   . Breast cancer ([place]) 2011   rt- radiation  . Cancer [facility]) 2011   rt breast  . Depression   . Dysrhythmia    CHB s/p PPM, afib history  . GERD (gastroesophageal reflux disease)   . Heart murmur   . Hypertension   . Presence of permanent cardiac pacemaker     Patient Active Problem List   Diagnosis Date Noted  . Pneumonia 11/09/2015  . Herniated lumbar intervertebral disc 02/10/2015  . Absolute anemia 01/14/2014  . Anxiety and depression 08/29/2011  . A-fib ([place]) 08/29/2011  . Breast CA ([place]) 08/29/2011  . Artificial cardiac pacemaker 08/29/2011  . Acquired complete AV block ([place]) 08/29/2011    Past Surgical History:  Procedure Laterality Date  . BREAST BIOPSY Right 2011   right lumpectomy rad  . BREAST SURGERY Right    bx  . EYE SURGERY Bilateral    cataracts  . LUMBAR LAMINECTOMY/DECOMPRESSION MICRODISCECTOMY Right 02/10/2015   Procedure: Right Lumbar Four-Five Microdiskectomy;  Surgeon: Erline Levine, MD;  Location: [place] [facility];  Service: Neurosurgery;  Laterality: Right;  Right L4-5 Microdiskectomy  . PACEMAKER INSERTION     99.07    OB History    No data available       Home Medications    Prior  to Admission medications   Medication Sig Start Date End Date Taking? Authorizing Provider  ALPRAZolam (XANAX) 0.25 MG tablet Take 0.25 mg by mouth at bedtime.    Historical Provider, MD  Biotin 2500 MCG CAPS Take 5,000 mcg by mouth daily.     Historical Provider, MD  carbidopa-levodopa (SINEMET IR) 25-100 MG per tablet Take 1-2 tablets by mouth 3 (three) times daily. Takes 1 tablet at 1800 and 2 tablets at bedtime. May take an additionall tablet if needed    Historical Provider, MD  carvedilol (COREG) 6.25 MG tablet Take 3.125 mg by mouth 2 (two) times daily with a meal.    Historical Provider, MD  cholecalciferol (VITAMIN D) 1000 UNITS tablet Take 1,000 Units by mouth 2 (two) times daily.    Historical Provider, MD  citalopram (CELEXA) 10 MG tablet Take 10 mg by mouth daily.    Historical Provider, MD  ferrous sulfate 325 (65 FE) MG tablet Take 325 mg by mouth daily with breakfast.    Historical Provider, MD  gabapentin (NEURONTIN) 100 MG capsule Take 200 mg by mouth 3 (three) times daily.    Historical Provider, MD  HYDROcodone-acetaminophen (NORCO) 10-325 MG per tablet Take 1 tablet by mouth 3 (three) times daily.    Historical Provider, MD  losartan (COZAAR) 100 MG tablet Take 100  mg by mouth daily.    Historical Provider, MD  losartan (COZAAR) 50 MG tablet Take 50 mg by mouth daily.    Historical Provider, MD  Multiple Vitamins-Minerals (MULTIVITAMIN WITH MINERALS) tablet Take 1 tablet by mouth daily.    Historical Provider, MD  Multiple Vitamins-Minerals (PRESERVISION AREDS) CAPS Take 1 capsule by mouth 2 (two) times daily.    Historical Provider, MD  omeprazole (PRILOSEC) 20 MG capsule Take 20 mg by mouth daily. Reported on 11/09/2015    Historical Provider, MD  sennosides-docusate sodium (SENOKOT-S) 8.6-50 MG tablet Take 1 tablet by mouth daily.    Historical Provider, MD  simvastatin (ZOCOR) 20 MG tablet Take 10 mg by mouth daily.    Historical Provider, MD  traMADol (ULTRAM) 50 MG tablet  Take 0.5-1 tablets (25-50 mg total) by mouth every 6 (six) hours as needed. 11/12/16   Duanne Guess, PA-C  traZODone (DESYREL) 100 MG tablet Take 100 mg by mouth at bedtime.    Historical Provider, MD  warfarin (COUMADIN) 5 MG tablet Take 5 mg by mouth every evening.    Historical Provider, MD    Family History Family History  Problem Relation Age of Onset  . COPD Mother   . Stroke Mother   . Atrial fibrillation Mother   . Lung cancer Father   . Breast cancer Neg Hx     Social History Social History  Substance Use Topics  . Smoking status: Never Smoker  . Smokeless tobacco: Not on file  . Alcohol use No     Allergies   Naproxen; Sucralfate; and Prednisone   Review of Systems Review of Systems  Constitutional: Negative for activity change, chills, fatigue and fever.  HENT: Negative for congestion, sinus pressure and sore throat.   Eyes: Negative for visual disturbance.  Respiratory: Negative for cough, chest tightness and shortness of breath.   Cardiovascular: Negative for chest pain and leg swelling.  Gastrointestinal: Negative for abdominal pain, diarrhea, nausea and vomiting.  Genitourinary: Negative for dysuria.  Musculoskeletal: Positive for arthralgias and joint swelling. Negative for gait problem.  Skin: Negative for rash.  Neurological: Negative for weakness, numbness and headaches.  Hematological: Negative for adenopathy.  Psychiatric/Behavioral: Negative for agitation, behavioral problems and confusion.     Physical Exam Updated Vital Signs BP (!) 168/79 (BP Location: Left Arm)   Pulse 71   Temp 98.6 F (37 C) (Oral)   Resp 18   Ht 5\' 4"  (1.626 m)   Wt 59 kg   SpO2 100%   BMI 22.31 kg/m   Physical Exam  Constitutional: She is oriented to person, place, and time. She appears well-developed and well-nourished. No distress.  HENT:  Head: Normocephalic and atraumatic.  Eyes: Conjunctivae are normal.  Neck: Neck supple.  Cardiovascular: Normal  rate and regular rhythm.   No murmur heard. Pulmonary/Chest: No respiratory distress.  Musculoskeletal:  Examination of the right lower extremity shows patient has swelling along the distal fibula and base of fifth metatarsal. She is tender to palpation in these areas. There is no skin breakdown noted. She is nontender along the calcaneus. She has good ankle plantarflexion dorsiflexion. 2+ dorsalis pedis pulse. Good internal and external rotation of her hip with no discomfort. She is able to straight leg raise with the right knee.  Neurological: She is alert and oriented to person, place, and time.  Skin: Skin is warm and dry.  Psychiatric: She has a normal mood and affect.  Nursing note and vitals reviewed.  ED Treatments / Results  Labs (all labs ordered are listed, but only abnormal results are displayed) Labs Reviewed - No data to display  EKG  EKG Interpretation None       Radiology Dg Ankle Complete Right  Result Date: 11/12/2016 CLINICAL DATA:  Twisting injury with pain EXAM: RIGHT ANKLE - COMPLETE 3+ VIEW COMPARISON:  None. FINDINGS: Cortical avulsion at the lateral fibular malleolar tip. Moderate soft tissue swelling laterally. Irregular os ossific opacities along the lateral aspect of the posterior foot is also suspicious for avulsion fracture. Partially visualized nondisplaced fracture at the base of the fifth metatarsal. Small plantar and posterior calcaneal spurs. IMPRESSION: 1. Avulsion fracture injury off the tip of the lateral fibular malleolus. Suspect cortical avulsion injuries along the lateral aspect of the posterior foot. 2. Nondisplaced fracture base of fifth metatarsal. Electronically Signed   By: Donavan Foil M.D.   On: 11/12/2016 20:56   Dg Foot Complete Right  Result Date: 11/12/2016 CLINICAL DATA:  Twisting injury and pain with bruising EXAM: RIGHT FOOT COMPLETE - 3+ VIEW COMPARISON:  None. FINDINGS: Degenerative changes of the DIP joints with narrowing  and osteophyte. Mildly comminuted fracture at the base of the fifth metatarsal with articular extension to the fifth TMT joint. No significant angulation. Small plantar calcaneal spur and posterior enthesophyte. Suspect avulsion fracture fragments off the lateral aspect of the cuboid bone as well. IMPRESSION: Nondisplaced, slightly comminuted intra-articular fracture at the base of the metatarsal. Possible avulsion fracture off the lateral cuboid. Electronically Signed   By: Donavan Foil M.D.   On: 11/12/2016 20:51    Procedures Procedures (including critical care time) SPLINT APPLICATION Date/Time: 1:30 PM Authorized by: Feliberto Gottron Consent: Verbal consent obtained. Risks and benefits: risks, benefits and alternatives were discussed Consent given by: patient Splint applied by: ED tech Location details: Right leg short leg posterior  Splint type: Short leg posterior  Supplies used: Ace wrap, Ortho-Glass prewrap  Post-procedure: The splinted body part was neurovascularly unchanged following the procedure. Patient tolerance: Patient tolerated the procedure well with no immediate complications.     Medications Ordered in ED Medications - No data to display   Initial Impression / Assessment and Plan / ED Course  I have reviewed the triage vital signs and the nursing notes.  Pertinent labs & imaging results that were available during my care of the patient were reviewed by me and considered in my medical decision making (see chart for details).     83 year old female with right distal fibular avulsion fracture with base of the fifth metatarsal fracture. She will be nonweightbearing. She is placed into a posterior splint. She has a walker at home and will ambulate with this. She will take Tylenol as needed for mild to moderate pain. She is given a prescription for tramadol for severe pain. She'll follow-up with podiatrist  Final Clinical Impressions(s) / ED Diagnoses    Final diagnoses:  Closed avulsion fracture of distal fibula, right, initial encounter  Closed nondisplaced fracture of fifth metatarsal bone of right foot, initial encounter    New Prescriptions New Prescriptions   TRAMADOL (ULTRAM) 50 MG TABLET    Take 0.5-1 tablets (25-50 mg total) by mouth every 6 (six) hours as needed.     Duanne Guess, PA-C 11/12/16 2143    Nena Polio, MD 11/12/16 [phone]

## 2016-11-12 NOTE — ED Triage Notes (Signed)
Pt was getting out of chair and twisted right foot, co pain to ankle and outer aspect of foot with bruising noted.  No other injury pt walks independently without assistance prior to injury.

## 2016-11-12 NOTE — Discharge Instructions (Signed)
Please use walker for ambulation. Rest ice and elevate the right foot. Call for Dr. in one day to schedule follow-up appointment. Take Tylenol and tramadol as needed for pain.

## 2016-11-14 DIAGNOSIS — S82831A Other fracture of upper and lower end of right fibula, initial encounter for closed fracture: Secondary | ICD-10-CM | POA: Diagnosis not present

## 2016-11-14 DIAGNOSIS — S92354A Nondisplaced fracture of fifth metatarsal bone, right foot, initial encounter for closed fracture: Secondary | ICD-10-CM | POA: Diagnosis not present

## 2016-12-02 DIAGNOSIS — Z45018 Encounter for adjustment and management of other part of cardiac pacemaker: Secondary | ICD-10-CM | POA: Diagnosis not present

## 2016-12-09 DIAGNOSIS — M79671 Pain in right foot: Secondary | ICD-10-CM | POA: Diagnosis not present

## 2016-12-09 DIAGNOSIS — S92351D Displaced fracture of fifth metatarsal bone, right foot, subsequent encounter for fracture with routine healing: Secondary | ICD-10-CM | POA: Diagnosis not present

## 2016-12-30 DIAGNOSIS — S92351D Displaced fracture of fifth metatarsal bone, right foot, subsequent encounter for fracture with routine healing: Secondary | ICD-10-CM | POA: Diagnosis not present

## 2017-01-06 DIAGNOSIS — F0281 Dementia in other diseases classified elsewhere with behavioral disturbance: Secondary | ICD-10-CM | POA: Diagnosis not present

## 2017-01-06 DIAGNOSIS — F419 Anxiety disorder, unspecified: Secondary | ICD-10-CM | POA: Diagnosis not present

## 2017-01-06 DIAGNOSIS — G301 Alzheimer's disease with late onset: Secondary | ICD-10-CM | POA: Diagnosis not present

## 2017-01-06 DIAGNOSIS — I482 Chronic atrial fibrillation: Secondary | ICD-10-CM | POA: Diagnosis not present

## 2017-01-06 DIAGNOSIS — F329 Major depressive disorder, single episode, unspecified: Secondary | ICD-10-CM | POA: Diagnosis not present

## 2017-01-20 DIAGNOSIS — S92351D Displaced fracture of fifth metatarsal bone, right foot, subsequent encounter for fracture with routine healing: Secondary | ICD-10-CM | POA: Diagnosis not present

## 2017-02-12 DIAGNOSIS — Z950 Presence of cardiac pacemaker: Secondary | ICD-10-CM | POA: Diagnosis not present

## 2017-02-12 DIAGNOSIS — F0150 Vascular dementia without behavioral disturbance: Secondary | ICD-10-CM | POA: Diagnosis not present

## 2017-02-12 DIAGNOSIS — D649 Anemia, unspecified: Secondary | ICD-10-CM | POA: Diagnosis not present

## 2017-02-12 DIAGNOSIS — I480 Paroxysmal atrial fibrillation: Secondary | ICD-10-CM | POA: Diagnosis not present

## 2017-02-12 DIAGNOSIS — Z0000 Encounter for general adult medical examination without abnormal findings: Secondary | ICD-10-CM | POA: Diagnosis not present

## 2017-02-12 DIAGNOSIS — I10 Essential (primary) hypertension: Secondary | ICD-10-CM | POA: Diagnosis not present

## 2017-02-12 DIAGNOSIS — I498 Other specified cardiac arrhythmias: Secondary | ICD-10-CM | POA: Diagnosis not present

## 2017-02-12 DIAGNOSIS — M1611 Unilateral primary osteoarthritis, right hip: Secondary | ICD-10-CM | POA: Diagnosis not present

## 2017-02-19 DIAGNOSIS — I10 Essential (primary) hypertension: Secondary | ICD-10-CM | POA: Diagnosis not present

## 2017-02-19 DIAGNOSIS — Z23 Encounter for immunization: Secondary | ICD-10-CM | POA: Diagnosis not present

## 2017-02-19 DIAGNOSIS — G301 Alzheimer's disease with late onset: Secondary | ICD-10-CM | POA: Diagnosis not present

## 2017-02-19 DIAGNOSIS — I482 Chronic atrial fibrillation: Secondary | ICD-10-CM | POA: Diagnosis not present

## 2017-02-19 DIAGNOSIS — F0281 Dementia in other diseases classified elsewhere with behavioral disturbance: Secondary | ICD-10-CM | POA: Diagnosis not present

## 2017-02-19 DIAGNOSIS — D649 Anemia, unspecified: Secondary | ICD-10-CM | POA: Diagnosis not present

## 2017-03-06 DIAGNOSIS — H40003 Preglaucoma, unspecified, bilateral: Secondary | ICD-10-CM | POA: Diagnosis not present

## 2017-03-11 DIAGNOSIS — S92351D Displaced fracture of fifth metatarsal bone, right foot, subsequent encounter for fracture with routine healing: Secondary | ICD-10-CM | POA: Diagnosis not present

## 2017-03-27 DIAGNOSIS — H11001 Unspecified pterygium of right eye: Secondary | ICD-10-CM | POA: Diagnosis not present

## 2017-04-02 DIAGNOSIS — H11002 Unspecified pterygium of left eye: Secondary | ICD-10-CM | POA: Insufficient documentation

## 2017-04-10 DIAGNOSIS — I10 Essential (primary) hypertension: Secondary | ICD-10-CM | POA: Diagnosis not present

## 2017-04-10 DIAGNOSIS — F0280 Dementia in other diseases classified elsewhere without behavioral disturbance: Secondary | ICD-10-CM | POA: Diagnosis not present

## 2017-04-10 DIAGNOSIS — I4891 Unspecified atrial fibrillation: Secondary | ICD-10-CM | POA: Diagnosis not present

## 2017-04-10 DIAGNOSIS — Z950 Presence of cardiac pacemaker: Secondary | ICD-10-CM | POA: Diagnosis not present

## 2017-04-10 DIAGNOSIS — G301 Alzheimer's disease with late onset: Secondary | ICD-10-CM | POA: Diagnosis not present

## 2017-04-10 DIAGNOSIS — Z01818 Encounter for other preprocedural examination: Secondary | ICD-10-CM | POA: Diagnosis not present

## 2017-04-29 DIAGNOSIS — H11002 Unspecified pterygium of left eye: Secondary | ICD-10-CM | POA: Diagnosis not present

## 2017-04-29 DIAGNOSIS — Z7901 Long term (current) use of anticoagulants: Secondary | ICD-10-CM | POA: Diagnosis not present

## 2017-04-29 DIAGNOSIS — Z79899 Other long term (current) drug therapy: Secondary | ICD-10-CM | POA: Diagnosis not present

## 2017-04-29 DIAGNOSIS — I10 Essential (primary) hypertension: Secondary | ICD-10-CM | POA: Diagnosis not present

## 2017-04-29 DIAGNOSIS — Z888 Allergy status to other drugs, medicaments and biological substances status: Secondary | ICD-10-CM | POA: Diagnosis not present

## 2017-04-29 DIAGNOSIS — G301 Alzheimer's disease with late onset: Secondary | ICD-10-CM | POA: Diagnosis not present

## 2017-04-29 DIAGNOSIS — Z950 Presence of cardiac pacemaker: Secondary | ICD-10-CM | POA: Diagnosis not present

## 2017-04-29 DIAGNOSIS — F0280 Dementia in other diseases classified elsewhere without behavioral disturbance: Secondary | ICD-10-CM | POA: Diagnosis not present

## 2017-04-29 DIAGNOSIS — I4891 Unspecified atrial fibrillation: Secondary | ICD-10-CM | POA: Diagnosis not present

## 2017-05-20 DIAGNOSIS — M8588 Other specified disorders of bone density and structure, other site: Secondary | ICD-10-CM | POA: Diagnosis not present

## 2017-06-09 DIAGNOSIS — H53452 Other localized visual field defect, left eye: Secondary | ICD-10-CM | POA: Diagnosis not present

## 2017-06-12 DIAGNOSIS — I10 Essential (primary) hypertension: Secondary | ICD-10-CM | POA: Diagnosis not present

## 2017-06-12 DIAGNOSIS — I482 Chronic atrial fibrillation: Secondary | ICD-10-CM | POA: Diagnosis not present

## 2017-06-12 DIAGNOSIS — G301 Alzheimer's disease with late onset: Secondary | ICD-10-CM | POA: Diagnosis not present

## 2017-06-12 DIAGNOSIS — F0281 Dementia in other diseases classified elsewhere with behavioral disturbance: Secondary | ICD-10-CM | POA: Diagnosis not present

## 2017-06-12 DIAGNOSIS — Z0000 Encounter for general adult medical examination without abnormal findings: Secondary | ICD-10-CM | POA: Diagnosis not present

## 2017-06-12 DIAGNOSIS — D649 Anemia, unspecified: Secondary | ICD-10-CM | POA: Diagnosis not present

## 2017-06-19 DIAGNOSIS — Z950 Presence of cardiac pacemaker: Secondary | ICD-10-CM | POA: Diagnosis not present

## 2017-06-19 DIAGNOSIS — I10 Essential (primary) hypertension: Secondary | ICD-10-CM | POA: Diagnosis not present

## 2017-06-19 DIAGNOSIS — I498 Other specified cardiac arrhythmias: Secondary | ICD-10-CM | POA: Diagnosis not present

## 2017-06-19 DIAGNOSIS — R140 Abdominal distension (gaseous): Secondary | ICD-10-CM | POA: Diagnosis not present

## 2017-06-19 DIAGNOSIS — D649 Anemia, unspecified: Secondary | ICD-10-CM | POA: Diagnosis not present

## 2017-06-19 DIAGNOSIS — F0281 Dementia in other diseases classified elsewhere with behavioral disturbance: Secondary | ICD-10-CM | POA: Diagnosis not present

## 2017-06-19 DIAGNOSIS — K5900 Constipation, unspecified: Secondary | ICD-10-CM | POA: Diagnosis not present

## 2017-06-19 DIAGNOSIS — I482 Chronic atrial fibrillation: Secondary | ICD-10-CM | POA: Diagnosis not present

## 2017-06-19 DIAGNOSIS — G301 Alzheimer's disease with late onset: Secondary | ICD-10-CM | POA: Diagnosis not present

## 2017-06-19 DIAGNOSIS — Z23 Encounter for immunization: Secondary | ICD-10-CM | POA: Diagnosis not present

## 2017-06-23 DIAGNOSIS — Z950 Presence of cardiac pacemaker: Secondary | ICD-10-CM | POA: Diagnosis not present

## 2017-06-23 DIAGNOSIS — Z45018 Encounter for adjustment and management of other part of cardiac pacemaker: Secondary | ICD-10-CM | POA: Diagnosis not present

## 2017-06-26 DIAGNOSIS — R140 Abdominal distension (gaseous): Secondary | ICD-10-CM | POA: Diagnosis not present

## 2017-06-26 DIAGNOSIS — D649 Anemia, unspecified: Secondary | ICD-10-CM | POA: Diagnosis not present

## 2017-06-26 DIAGNOSIS — Z1211 Encounter for screening for malignant neoplasm of colon: Secondary | ICD-10-CM | POA: Diagnosis not present

## 2017-06-30 DIAGNOSIS — H353132 Nonexudative age-related macular degeneration, bilateral, intermediate dry stage: Secondary | ICD-10-CM | POA: Diagnosis not present

## 2017-06-30 DIAGNOSIS — Z950 Presence of cardiac pacemaker: Secondary | ICD-10-CM | POA: Diagnosis not present

## 2017-06-30 DIAGNOSIS — Z961 Presence of intraocular lens: Secondary | ICD-10-CM | POA: Diagnosis not present

## 2017-06-30 DIAGNOSIS — F0390 Unspecified dementia without behavioral disturbance: Secondary | ICD-10-CM | POA: Diagnosis not present

## 2017-06-30 DIAGNOSIS — I442 Atrioventricular block, complete: Secondary | ICD-10-CM | POA: Diagnosis not present

## 2017-06-30 DIAGNOSIS — H43813 Vitreous degeneration, bilateral: Secondary | ICD-10-CM | POA: Diagnosis not present

## 2017-07-01 DIAGNOSIS — H353132 Nonexudative age-related macular degeneration, bilateral, intermediate dry stage: Secondary | ICD-10-CM | POA: Insufficient documentation

## 2017-07-02 ENCOUNTER — Other Ambulatory Visit: Payer: Self-pay | Admitting: Internal Medicine

## 2017-07-02 DIAGNOSIS — Z9889 Other specified postprocedural states: Secondary | ICD-10-CM

## 2017-07-11 DIAGNOSIS — F419 Anxiety disorder, unspecified: Secondary | ICD-10-CM | POA: Diagnosis not present

## 2017-07-11 DIAGNOSIS — I482 Chronic atrial fibrillation: Secondary | ICD-10-CM | POA: Diagnosis not present

## 2017-07-11 DIAGNOSIS — F0281 Dementia in other diseases classified elsewhere with behavioral disturbance: Secondary | ICD-10-CM | POA: Diagnosis not present

## 2017-07-11 DIAGNOSIS — G301 Alzheimer's disease with late onset: Secondary | ICD-10-CM | POA: Diagnosis not present

## 2017-07-11 DIAGNOSIS — F329 Major depressive disorder, single episode, unspecified: Secondary | ICD-10-CM | POA: Diagnosis not present

## 2017-07-21 DIAGNOSIS — H11001 Unspecified pterygium of right eye: Secondary | ICD-10-CM | POA: Diagnosis not present

## 2017-07-21 DIAGNOSIS — H43813 Vitreous degeneration, bilateral: Secondary | ICD-10-CM | POA: Diagnosis not present

## 2017-07-21 DIAGNOSIS — H11003 Unspecified pterygium of eye, bilateral: Secondary | ICD-10-CM | POA: Diagnosis not present

## 2017-07-21 DIAGNOSIS — H11002 Unspecified pterygium of left eye: Secondary | ICD-10-CM | POA: Diagnosis not present

## 2017-07-21 DIAGNOSIS — H353132 Nonexudative age-related macular degeneration, bilateral, intermediate dry stage: Secondary | ICD-10-CM | POA: Diagnosis not present

## 2017-08-12 ENCOUNTER — Other Ambulatory Visit: Payer: Self-pay

## 2017-09-08 DIAGNOSIS — H40003 Preglaucoma, unspecified, bilateral: Secondary | ICD-10-CM | POA: Diagnosis not present

## 2017-09-08 DIAGNOSIS — H353132 Nonexudative age-related macular degeneration, bilateral, intermediate dry stage: Secondary | ICD-10-CM | POA: Diagnosis not present

## 2017-09-12 ENCOUNTER — Ambulatory Visit
Admission: RE | Admit: 2017-09-12 | Discharge: 2017-09-12 | Disposition: A | Discharge status: Home / Self Care | Payer: Medicare HMO | Source: Ambulatory Visit | Attending: Internal Medicine | Admitting: Internal Medicine

## 2017-09-12 DIAGNOSIS — R928 Other abnormal and inconclusive findings on diagnostic imaging of breast: Secondary | ICD-10-CM | POA: Diagnosis not present

## 2017-09-12 DIAGNOSIS — Z9889 Other specified postprocedural states: Secondary | ICD-10-CM

## 2017-09-12 DIAGNOSIS — R922 Inconclusive mammogram: Secondary | ICD-10-CM | POA: Diagnosis not present

## 2017-09-12 HISTORY — DX: Personal history of irradiation: Z92.3

## 2017-09-22 DIAGNOSIS — H353132 Nonexudative age-related macular degeneration, bilateral, intermediate dry stage: Secondary | ICD-10-CM | POA: Diagnosis not present

## 2017-09-29 DIAGNOSIS — Z45018 Encounter for adjustment and management of other part of cardiac pacemaker: Secondary | ICD-10-CM | POA: Diagnosis not present

## 2017-09-29 DIAGNOSIS — Z950 Presence of cardiac pacemaker: Secondary | ICD-10-CM | POA: Diagnosis not present

## 2017-10-15 DIAGNOSIS — G301 Alzheimer's disease with late onset: Secondary | ICD-10-CM | POA: Diagnosis not present

## 2017-10-15 DIAGNOSIS — D649 Anemia, unspecified: Secondary | ICD-10-CM | POA: Diagnosis not present

## 2017-10-15 DIAGNOSIS — I482 Chronic atrial fibrillation: Secondary | ICD-10-CM | POA: Diagnosis not present

## 2017-10-15 DIAGNOSIS — F0281 Dementia in other diseases classified elsewhere with behavioral disturbance: Secondary | ICD-10-CM | POA: Diagnosis not present

## 2017-10-15 DIAGNOSIS — I10 Essential (primary) hypertension: Secondary | ICD-10-CM | POA: Diagnosis not present

## 2017-10-15 DIAGNOSIS — K5900 Constipation, unspecified: Secondary | ICD-10-CM | POA: Diagnosis not present

## 2017-10-15 DIAGNOSIS — I498 Other specified cardiac arrhythmias: Secondary | ICD-10-CM | POA: Diagnosis not present

## 2017-10-15 DIAGNOSIS — R140 Abdominal distension (gaseous): Secondary | ICD-10-CM | POA: Diagnosis not present

## 2017-10-15 DIAGNOSIS — Z950 Presence of cardiac pacemaker: Secondary | ICD-10-CM | POA: Diagnosis not present

## 2017-10-22 DIAGNOSIS — D649 Anemia, unspecified: Secondary | ICD-10-CM | POA: Diagnosis not present

## 2017-10-22 DIAGNOSIS — I10 Essential (primary) hypertension: Secondary | ICD-10-CM | POA: Diagnosis not present

## 2017-10-22 DIAGNOSIS — Z950 Presence of cardiac pacemaker: Secondary | ICD-10-CM | POA: Diagnosis not present

## 2017-10-22 DIAGNOSIS — M19032 Primary osteoarthritis, left wrist: Secondary | ICD-10-CM | POA: Diagnosis not present

## 2017-10-22 DIAGNOSIS — I498 Other specified cardiac arrhythmias: Secondary | ICD-10-CM | POA: Diagnosis not present

## 2017-10-22 DIAGNOSIS — Z23 Encounter for immunization: Secondary | ICD-10-CM | POA: Diagnosis not present

## 2017-10-22 DIAGNOSIS — F329 Major depressive disorder, single episode, unspecified: Secondary | ICD-10-CM | POA: Diagnosis not present

## 2017-10-22 DIAGNOSIS — F419 Anxiety disorder, unspecified: Secondary | ICD-10-CM | POA: Diagnosis not present

## 2017-10-27 DIAGNOSIS — I10 Essential (primary) hypertension: Secondary | ICD-10-CM | POA: Diagnosis not present

## 2017-10-27 DIAGNOSIS — Z950 Presence of cardiac pacemaker: Secondary | ICD-10-CM | POA: Diagnosis not present

## 2017-10-27 DIAGNOSIS — F419 Anxiety disorder, unspecified: Secondary | ICD-10-CM | POA: Diagnosis not present

## 2017-10-27 DIAGNOSIS — I498 Other specified cardiac arrhythmias: Secondary | ICD-10-CM | POA: Diagnosis not present

## 2017-10-27 DIAGNOSIS — Z23 Encounter for immunization: Secondary | ICD-10-CM | POA: Diagnosis not present

## 2017-10-27 DIAGNOSIS — F329 Major depressive disorder, single episode, unspecified: Secondary | ICD-10-CM | POA: Diagnosis not present

## 2017-10-27 DIAGNOSIS — D649 Anemia, unspecified: Secondary | ICD-10-CM | POA: Diagnosis not present

## 2017-10-27 DIAGNOSIS — M19032 Primary osteoarthritis, left wrist: Secondary | ICD-10-CM | POA: Diagnosis not present

## 2018-01-01 DIAGNOSIS — I480 Paroxysmal atrial fibrillation: Secondary | ICD-10-CM | POA: Diagnosis not present

## 2018-01-01 DIAGNOSIS — I10 Essential (primary) hypertension: Secondary | ICD-10-CM | POA: Diagnosis not present

## 2018-01-01 DIAGNOSIS — E785 Hyperlipidemia, unspecified: Secondary | ICD-10-CM | POA: Diagnosis not present

## 2018-01-01 DIAGNOSIS — I498 Other specified cardiac arrhythmias: Secondary | ICD-10-CM | POA: Diagnosis not present

## 2018-01-01 DIAGNOSIS — Z950 Presence of cardiac pacemaker: Secondary | ICD-10-CM | POA: Diagnosis not present

## 2018-01-01 DIAGNOSIS — Z7901 Long term (current) use of anticoagulants: Secondary | ICD-10-CM | POA: Diagnosis not present

## 2018-01-01 DIAGNOSIS — Z853 Personal history of malignant neoplasm of breast: Secondary | ICD-10-CM | POA: Diagnosis not present

## 2018-01-01 DIAGNOSIS — Z45018 Encounter for adjustment and management of other part of cardiac pacemaker: Secondary | ICD-10-CM | POA: Diagnosis not present

## 2018-01-01 DIAGNOSIS — I442 Atrioventricular block, complete: Secondary | ICD-10-CM | POA: Diagnosis not present

## 2018-01-01 DIAGNOSIS — I472 Ventricular tachycardia: Secondary | ICD-10-CM | POA: Diagnosis not present

## 2018-01-07 DIAGNOSIS — H40003 Preglaucoma, unspecified, bilateral: Secondary | ICD-10-CM | POA: Diagnosis not present

## 2018-01-08 DIAGNOSIS — G301 Alzheimer's disease with late onset: Secondary | ICD-10-CM | POA: Diagnosis not present

## 2018-01-08 DIAGNOSIS — F419 Anxiety disorder, unspecified: Secondary | ICD-10-CM | POA: Diagnosis not present

## 2018-01-08 DIAGNOSIS — I482 Chronic atrial fibrillation: Secondary | ICD-10-CM | POA: Diagnosis not present

## 2018-01-08 DIAGNOSIS — F329 Major depressive disorder, single episode, unspecified: Secondary | ICD-10-CM | POA: Diagnosis not present

## 2018-01-08 DIAGNOSIS — F0281 Dementia in other diseases classified elsewhere with behavioral disturbance: Secondary | ICD-10-CM | POA: Diagnosis not present

## 2018-01-15 DIAGNOSIS — H40003 Preglaucoma, unspecified, bilateral: Secondary | ICD-10-CM | POA: Diagnosis not present

## 2018-01-30 DIAGNOSIS — M2011 Hallux valgus (acquired), right foot: Secondary | ICD-10-CM | POA: Diagnosis not present

## 2018-01-30 DIAGNOSIS — M79671 Pain in right foot: Secondary | ICD-10-CM | POA: Diagnosis not present

## 2018-01-30 DIAGNOSIS — M792 Neuralgia and neuritis, unspecified: Secondary | ICD-10-CM | POA: Diagnosis not present

## 2018-02-17 DIAGNOSIS — Z950 Presence of cardiac pacemaker: Secondary | ICD-10-CM | POA: Diagnosis not present

## 2018-02-17 DIAGNOSIS — I10 Essential (primary) hypertension: Secondary | ICD-10-CM | POA: Diagnosis not present

## 2018-02-17 DIAGNOSIS — F329 Major depressive disorder, single episode, unspecified: Secondary | ICD-10-CM | POA: Diagnosis not present

## 2018-02-17 DIAGNOSIS — F419 Anxiety disorder, unspecified: Secondary | ICD-10-CM | POA: Diagnosis not present

## 2018-02-17 DIAGNOSIS — M19032 Primary osteoarthritis, left wrist: Secondary | ICD-10-CM | POA: Diagnosis not present

## 2018-02-17 DIAGNOSIS — D649 Anemia, unspecified: Secondary | ICD-10-CM | POA: Diagnosis not present

## 2018-02-17 DIAGNOSIS — I498 Other specified cardiac arrhythmias: Secondary | ICD-10-CM | POA: Diagnosis not present

## 2018-02-24 DIAGNOSIS — G301 Alzheimer's disease with late onset: Secondary | ICD-10-CM | POA: Diagnosis not present

## 2018-02-24 DIAGNOSIS — F419 Anxiety disorder, unspecified: Secondary | ICD-10-CM | POA: Diagnosis not present

## 2018-02-24 DIAGNOSIS — I480 Paroxysmal atrial fibrillation: Secondary | ICD-10-CM | POA: Diagnosis not present

## 2018-02-24 DIAGNOSIS — F0281 Dementia in other diseases classified elsewhere with behavioral disturbance: Secondary | ICD-10-CM | POA: Diagnosis not present

## 2018-02-24 DIAGNOSIS — D649 Anemia, unspecified: Secondary | ICD-10-CM | POA: Diagnosis not present

## 2018-02-24 DIAGNOSIS — I10 Essential (primary) hypertension: Secondary | ICD-10-CM | POA: Diagnosis not present

## 2018-02-24 DIAGNOSIS — Z23 Encounter for immunization: Secondary | ICD-10-CM | POA: Diagnosis not present

## 2018-02-24 DIAGNOSIS — Z0000 Encounter for general adult medical examination without abnormal findings: Secondary | ICD-10-CM | POA: Diagnosis not present

## 2018-02-24 DIAGNOSIS — M5126 Other intervertebral disc displacement, lumbar region: Secondary | ICD-10-CM | POA: Diagnosis not present

## 2018-03-20 DIAGNOSIS — H353132 Nonexudative age-related macular degeneration, bilateral, intermediate dry stage: Secondary | ICD-10-CM | POA: Diagnosis not present

## 2018-04-02 DIAGNOSIS — Z950 Presence of cardiac pacemaker: Secondary | ICD-10-CM | POA: Diagnosis not present

## 2018-04-02 DIAGNOSIS — Z45018 Encounter for adjustment and management of other part of cardiac pacemaker: Secondary | ICD-10-CM | POA: Diagnosis not present

## 2018-04-02 DIAGNOSIS — I442 Atrioventricular block, complete: Secondary | ICD-10-CM | POA: Diagnosis not present

## 2018-04-16 DIAGNOSIS — R399 Unspecified symptoms and signs involving the genitourinary system: Secondary | ICD-10-CM | POA: Diagnosis not present

## 2018-04-16 DIAGNOSIS — R8290 Unspecified abnormal findings in urine: Secondary | ICD-10-CM | POA: Diagnosis not present

## 2018-05-11 ENCOUNTER — Other Ambulatory Visit: Payer: Self-pay

## 2018-05-11 ENCOUNTER — Ambulatory Visit: Payer: Medicare HMO | Admitting: Urology

## 2018-05-11 ENCOUNTER — Encounter: Payer: Self-pay | Admitting: Urology

## 2018-05-11 VITALS — BP 123/69 | HR 79 | Ht 66.0 in | Wt 127.0 lb

## 2018-05-11 DIAGNOSIS — R3129 Other microscopic hematuria: Secondary | ICD-10-CM | POA: Diagnosis not present

## 2018-05-11 NOTE — Progress Notes (Signed)
05/11/2018 10:41 AM   Sally Carroll [birth year] [id number]  Referring provider: Tracie Harrier, MD [address] [facility] [place], [place] [address]  Chief Complaint  Patient presents with  . Hematuria    HPI: Patient is a 84 year old Caucasian female who presents today as a referral from Dr. Ginette Pitman for microscopic hematuria with her sister-in-law, Sally Carroll.    Patient was found to have microscopic hematuria on 04/16/2018 with 4-10 RBC's/hpf with negative urine culture.    She does not have a prior history of recurrent urinary tract infections, nephrolithiasis, trauma to the genitourinary tract or malignancies of the genitourinary tract.  She does not have a family medical history of nephrolithiasis or hematuria.   Her brother had bladder cancer treated with BCG.    Today, she is having symptoms of strong urgency, nocturia x 2-3 and incontinence at night time.  Her UA today demonstrates no hematuria.    Patient denies any gross hematuria, dysuria or suprapubic/flank pain.  Patient denies any fevers, chills, nausea or vomiting.   She is not a smoker.   She was exposed to secondhand smoke as a child.    PMH: Past Medical History:  Diagnosis Date  . Anemia   . Anxiety   . Arthritis   . Breast cancer ([place]) 2011   rt- radiation  . Cancer [facility]) 2011   rt breast  . Depression   . Dysrhythmia    CHB s/p PPM, afib history  . GERD (gastroesophageal reflux disease)   . Heart murmur   . Hypertension   . Personal history of radiation therapy   . Presence of permanent cardiac pacemaker     Surgical History: Past Surgical History:  Procedure Laterality Date  . BREAST BIOPSY Right 2011   right lumpectomy rad  . BREAST SURGERY Right    bx  . EYE SURGERY Bilateral    cataracts  . LUMBAR LAMINECTOMY/DECOMPRESSION MICRODISCECTOMY Right 02/10/2015   Procedure: Right Lumbar Four-Five Microdiskectomy;  Surgeon: Erline Levine, MD;  Location: [place] [facility];   Service: Neurosurgery;  Laterality: Right;  Right L4-5 Microdiskectomy  . PACEMAKER INSERTION     99.07    Home Medications:  Allergies as of 05/11/2018      Reactions   Naproxen    Other reaction(s): Other (See Comments) GI upset   Sucralfate    Other reaction(s): Other (See Comments) Abdominal bloating   Prednisone Anxiety   Shakes      Medication List        Accurate as of 05/11/18 10:41 AM. Always use your most recent med list.          ALPRAZolam 0.25 MG tablet Commonly known as:  XANAX Take 0.25 mg by mouth at bedtime.   Biotin 2500 MCG Caps Take 5,000 mcg by mouth daily.   carbidopa-levodopa 25-100 MG tablet Commonly known as:  SINEMET IR Take 1-2 tablets by mouth 3 (three) times daily. Takes 1 tablet at 1800 and 2 tablets at bedtime. May take an additionall tablet if needed   carvedilol 6.25 MG tablet Commonly known as:  COREG Take 3.125 mg by mouth 2 (two) times daily with a meal.   cholecalciferol 1000 units tablet Commonly known as:  VITAMIN D Take 1,000 Units by mouth 2 (two) times daily.   citalopram 10 MG tablet Commonly known as:  CELEXA Take 10 mg by mouth daily.   ferrous sulfate 325 (65 FE) MG tablet Take 325 mg by mouth daily with breakfast.  gabapentin 100 MG capsule Commonly known as:  NEURONTIN Take 200 mg by mouth 3 (three) times daily.   HYDROcodone-acetaminophen 10-325 MG tablet Commonly known as:  NORCO Take 1 tablet by mouth 3 (three) times daily.   losartan 100 MG tablet Commonly known as:  COZAAR Take 100 mg by mouth daily.   losartan 50 MG tablet Commonly known as:  COZAAR Take 50 mg by mouth daily.   multivitamin with minerals tablet Take 1 tablet by mouth daily.   PRESERVISION AREDS Caps Take 1 capsule by mouth 2 (two) times daily.   omeprazole 20 MG capsule Commonly known as:  PRILOSEC Take 20 mg by mouth daily. Reported on 11/09/2015   sennosides-docusate sodium 8.6-50 MG tablet Commonly known as:   SENOKOT-S Take 1 tablet by mouth daily.   simvastatin 20 MG tablet Commonly known as:  ZOCOR Take 10 mg by mouth daily.   traMADol 50 MG tablet Commonly known as:  ULTRAM Take 0.5-1 tablets (25-50 mg total) by mouth every 6 (six) hours as needed.   traZODone 100 MG tablet Commonly known as:  DESYREL Take 100 mg by mouth at bedtime.   warfarin 5 MG tablet Commonly known as:  COUMADIN Take 5 mg by mouth every evening.       Allergies:  Allergies  Allergen Reactions  . Naproxen     Other reaction(s): Other (See Comments) GI upset  . Sucralfate     Other reaction(s): Other (See Comments) Abdominal bloating  . Prednisone Anxiety    Shakes    Family History: Family History  Problem Relation Age of Onset  . COPD Mother   . Stroke Mother   . Atrial fibrillation Mother   . Lung cancer Father   . Breast cancer Neg Hx     Social History:  reports that she has never smoked. She has never used smokeless tobacco. She reports that she does not drink alcohol or use drugs.  ROS: UROLOGY Frequent Urination?: No Hard to postpone urination?: Yes Burning/pain with urination?: No Get up at night to urinate?: Yes Leakage of urine?: Yes Urine stream starts and stops?: No Trouble starting stream?: No Do you have to strain to urinate?: No Blood in urine?: No Urinary tract infection?: No Sexually transmitted disease?: No Injury to kidneys or bladder?: No Painful intercourse?: No Weak stream?: No Currently pregnant?: No Vaginal bleeding?: No  Gastrointestinal Nausea?: No Vomiting?: No Indigestion/heartburn?: No Diarrhea?: No Constipation?: Yes  Constitutional Fever: No Night sweats?: No Weight loss?: Yes Fatigue?: Yes  Skin Skin rash/lesions?: No Itching?: No  Eyes Blurred vision?: Yes Double vision?: No  Ears/Nose/Throat Sore throat?: No Sinus problems?: No  Hematologic/Lymphatic Swollen glands?: No Easy bruising?: No  Cardiovascular Leg swelling?:  No Chest pain?: No  Respiratory Cough?: No Shortness of breath?: No  Endocrine Excessive thirst?: No  Musculoskeletal Back pain?: No Joint pain?: No  Neurological Headaches?: No Dizziness?: No  Psychologic Depression?: Yes Anxiety?: Yes  Physical Exam: BP 123/69   Pulse 79   Ht 5\' 6"  (1.676 m)   Wt 127 lb (57.6 kg)   BMI 20.50 kg/m   Constitutional:  Well nourished. Alert and oriented, No acute distress. HEENT: [place] AT, moist mucus membranes.  Trachea midline, no masses. Cardiovascular: No clubbing, cyanosis, or edema. Respiratory: Normal respiratory effort, no increased work of breathing. GI: Abdomen is soft, non tender, non distended, no abdominal masses. Liver and spleen not palpable.  No hernias appreciated.  Stool sample for occult testing is not indicated.  GU: No CVA tenderness.  No bladder fullness or masses.   Skin: No rashes, bruises or suspicious lesions. Lymph: No cervical or inguinal adenopathy. Neurologic: Grossly intact, no focal deficits, moving all 4 extremities. Psychiatric: Normal mood and affect.  Laboratory Data: Lab Results  Component Value Date   WBC 7.9 11/11/2015   HGB 9.4 (L) 11/11/2015   HCT 28.0 (L) 11/11/2015   MCV 97.2 11/11/2015   PLT 138 (L) 11/11/2015    Lab Results  Component Value Date   CREATININE 1.01 (H) 11/11/2015    No results found for: PSA  No results found for: TESTOSTERONE  No results found for: HGBA1C  No results found for: TSH  No results found for: CHOL, HDL, CHOLHDL, VLDL, LDLCALC  Lab Results  Component Value Date   AST 31 11/09/2015   Lab Results  Component Value Date   ALT 16 11/09/2015   No components found for: ALKALINEPHOPHATASE No components found for: BILIRUBINTOTAL  No results found for: ESTRADIOL   Urinalysis Moderate bacteria.  See Epic.   I have reviewed the labs.  Assessment & Plan:    1. Microscopic hematuria Explained to the patient that there are a number of causes that  can be associated with blood in the urine, such as stones, UTI's, damage to the urinary tract and/or cancer. At this time, I felt that the patient warranted further urologic evaluation.   The AUA guidelines state that a CT urogram is the preferred imaging study to evaluate hematuria. I explained to the patient that a contrast material will be injected into a vein and that in rare instances, an allergic reaction can result and may even life threatening   The patient denies any allergies to contrast, iodine and/or seafood and is not taking metformin. Following the imaging study,  I've recommended a cystoscopy. I described how this is performed, typically in an office setting with a flexible cystoscope. We described the risks, benefits, and possible side effects, the most common of which is a minor amount of blood in the urine and/or burning which usually resolves in 24 to 48 hours.   The patient had the opportunity to ask questions which were answered. Based upon this discussion, the patient is willing to proceed. Therefore, I've ordered: a CT Urogram and cystoscopy.  The patient will return following all of the above for discussion of the results.  UA  Urine culture  BUN + creatinine     Return for CT Urogram report and cystoscopy.  These notes generated with voice recognition software. I apologize for typographical errors.  Zara Council, PA-C  [facility] [address] [place]  [place], [place] [address] [phone]

## 2018-05-12 LAB — MICROSCOPIC EXAMINATION: RBC MICROSCOPIC, UA: NONE SEEN /HPF (ref 0–2)

## 2018-05-12 LAB — URINALYSIS, COMPLETE
Bilirubin, UA: NEGATIVE
Glucose, UA: NEGATIVE
Ketones, UA: NEGATIVE
Leukocytes, UA: NEGATIVE
NITRITE UA: NEGATIVE
PH UA: 5 (ref 5.0–7.5)
RBC, UA: NEGATIVE
Specific Gravity, UA: >1.03 — ABNORMAL HIGH (ref 1.005–1.030)
UUROB: 0.2 mg/dL (ref 0.2–1.0)

## 2018-05-13 LAB — CULTURE, URINE COMPREHENSIVE

## 2018-05-19 ENCOUNTER — Other Ambulatory Visit: Payer: Self-pay | Admitting: Urology

## 2018-05-26 ENCOUNTER — Ambulatory Visit
Admission: RE | Admit: 2018-05-26 | Discharge: 2018-05-26 | Disposition: A | Discharge status: Home / Self Care | Payer: Medicare HMO | Source: Ambulatory Visit | Attending: Urology | Admitting: Urology

## 2018-05-26 DIAGNOSIS — I517 Cardiomegaly: Secondary | ICD-10-CM | POA: Diagnosis not present

## 2018-05-26 DIAGNOSIS — R3129 Other microscopic hematuria: Secondary | ICD-10-CM

## 2018-05-26 DIAGNOSIS — J90 Pleural effusion, not elsewhere classified: Secondary | ICD-10-CM | POA: Diagnosis not present

## 2018-05-26 DIAGNOSIS — K654 Sclerosing mesenteritis: Secondary | ICD-10-CM | POA: Insufficient documentation

## 2018-05-26 DIAGNOSIS — N289 Disorder of kidney and ureter, unspecified: Secondary | ICD-10-CM | POA: Diagnosis not present

## 2018-05-26 LAB — POCT I-STAT CREATININE: CREATININE: 1 mg/dL (ref 0.44–1.00)

## 2018-05-26 MED ORDER — IOPAMIDOL (ISOVUE-300) INJECTION 61%
100.0000 mL | Freq: Once | INTRAVENOUS | Status: AC | PRN
  Administered 2018-05-26: 100 mL via INTRAVENOUS

## 2018-06-04 ENCOUNTER — Ambulatory Visit (INDEPENDENT_AMBULATORY_CARE_PROVIDER_SITE_OTHER): Payer: Medicare HMO | Admitting: Urology

## 2018-06-04 ENCOUNTER — Encounter: Payer: Self-pay | Admitting: Urology

## 2018-06-04 VITALS — BP 128/78 | HR 88 | Ht 63.0 in | Wt 120.0 lb

## 2018-06-04 DIAGNOSIS — R3129 Other microscopic hematuria: Secondary | ICD-10-CM | POA: Diagnosis not present

## 2018-06-04 LAB — MICROSCOPIC EXAMINATION: RBC, UA: NONE SEEN /hpf (ref 0–2)

## 2018-06-04 LAB — URINALYSIS, COMPLETE
Bilirubin, UA: NEGATIVE
Glucose, UA: NEGATIVE
LEUKOCYTES UA: NEGATIVE
Nitrite, UA: NEGATIVE
PH UA: 5 (ref 5.0–7.5)
RBC UA: NEGATIVE
Specific Gravity, UA: >1.03 — ABNORMAL HIGH (ref 1.005–1.030)
Urobilinogen, Ur: 0.2 mg/dL (ref 0.2–1.0)

## 2018-06-04 MED ORDER — LIDOCAINE HCL URETHRAL/MUCOSAL 2 % EX GEL
1.0000 "application " | Freq: Once | CUTANEOUS | Status: AC
  Administered 2018-06-04: 1 via URETHRAL

## 2018-06-04 NOTE — Progress Notes (Signed)
Cystoscopy Procedure Note:  Indication: Microscopic hematuria  After informed consent and discussion of the procedure and its risks, Sally Carroll was positioned and prepped in the standard fashion. Cystoscopy was performed with a flexible cystoscope. The urethra, bladder neck and entire bladder was visualized in a standard fashion. There were some dilated blood vessels at the base of the bladder. No tumors or concerning lesions.  Imaging: I personally reviewed the CT urogram dated 05/26/2018.  There is no urologic abnormalities.  No hydronephrosis, no urolithiasis.  Findings: Normal cystoscopy  Assessment and Plan: Trial of Myrbetriq 25mg  daily for urgency with urge incontinence RTC with Zara Council, PA in 4 weeks  Nickolas Madrid, MD 06/04/2018

## 2018-06-10 DIAGNOSIS — G301 Alzheimer's disease with late onset: Secondary | ICD-10-CM | POA: Diagnosis not present

## 2018-06-10 DIAGNOSIS — F0281 Dementia in other diseases classified elsewhere with behavioral disturbance: Secondary | ICD-10-CM | POA: Diagnosis not present

## 2018-06-23 DIAGNOSIS — F0281 Dementia in other diseases classified elsewhere with behavioral disturbance: Secondary | ICD-10-CM | POA: Diagnosis not present

## 2018-06-23 DIAGNOSIS — R8290 Unspecified abnormal findings in urine: Secondary | ICD-10-CM | POA: Diagnosis not present

## 2018-06-23 DIAGNOSIS — F419 Anxiety disorder, unspecified: Secondary | ICD-10-CM | POA: Diagnosis not present

## 2018-06-23 DIAGNOSIS — M5126 Other intervertebral disc displacement, lumbar region: Secondary | ICD-10-CM | POA: Diagnosis not present

## 2018-06-23 DIAGNOSIS — I480 Paroxysmal atrial fibrillation: Secondary | ICD-10-CM | POA: Diagnosis not present

## 2018-06-23 DIAGNOSIS — F329 Major depressive disorder, single episode, unspecified: Secondary | ICD-10-CM | POA: Diagnosis not present

## 2018-06-23 DIAGNOSIS — G301 Alzheimer's disease with late onset: Secondary | ICD-10-CM | POA: Diagnosis not present

## 2018-06-23 DIAGNOSIS — I10 Essential (primary) hypertension: Secondary | ICD-10-CM | POA: Diagnosis not present

## 2018-06-23 DIAGNOSIS — D649 Anemia, unspecified: Secondary | ICD-10-CM | POA: Diagnosis not present

## 2018-06-30 DIAGNOSIS — I480 Paroxysmal atrial fibrillation: Secondary | ICD-10-CM | POA: Diagnosis not present

## 2018-06-30 DIAGNOSIS — D649 Anemia, unspecified: Secondary | ICD-10-CM | POA: Diagnosis not present

## 2018-06-30 DIAGNOSIS — Z111 Encounter for screening for respiratory tuberculosis: Secondary | ICD-10-CM | POA: Diagnosis not present

## 2018-06-30 DIAGNOSIS — Z0000 Encounter for general adult medical examination without abnormal findings: Secondary | ICD-10-CM | POA: Diagnosis not present

## 2018-06-30 DIAGNOSIS — Z23 Encounter for immunization: Secondary | ICD-10-CM | POA: Diagnosis not present

## 2018-06-30 DIAGNOSIS — I498 Other specified cardiac arrhythmias: Secondary | ICD-10-CM | POA: Diagnosis not present

## 2018-06-30 DIAGNOSIS — G301 Alzheimer's disease with late onset: Secondary | ICD-10-CM | POA: Diagnosis not present

## 2018-06-30 DIAGNOSIS — G2581 Restless legs syndrome: Secondary | ICD-10-CM | POA: Diagnosis not present

## 2018-06-30 DIAGNOSIS — R8290 Unspecified abnormal findings in urine: Secondary | ICD-10-CM | POA: Diagnosis not present

## 2018-07-07 ENCOUNTER — Ambulatory Visit: Payer: Medicare HMO | Admitting: Urology

## 2018-07-07 ENCOUNTER — Encounter: Payer: Self-pay | Admitting: Urology

## 2018-07-07 VITALS — BP 141/83 | HR 84 | Ht 65.0 in | Wt 123.4 lb

## 2018-07-07 DIAGNOSIS — R351 Nocturia: Secondary | ICD-10-CM | POA: Diagnosis not present

## 2018-07-07 DIAGNOSIS — R3129 Other microscopic hematuria: Secondary | ICD-10-CM

## 2018-07-07 DIAGNOSIS — I480 Paroxysmal atrial fibrillation: Secondary | ICD-10-CM | POA: Diagnosis not present

## 2018-07-07 DIAGNOSIS — Z87448 Personal history of other diseases of urinary system: Secondary | ICD-10-CM

## 2018-07-07 DIAGNOSIS — E7800 Pure hypercholesterolemia, unspecified: Secondary | ICD-10-CM | POA: Diagnosis not present

## 2018-07-07 DIAGNOSIS — N3941 Urge incontinence: Secondary | ICD-10-CM | POA: Diagnosis not present

## 2018-07-07 DIAGNOSIS — I442 Atrioventricular block, complete: Secondary | ICD-10-CM | POA: Diagnosis not present

## 2018-07-07 DIAGNOSIS — I10 Essential (primary) hypertension: Secondary | ICD-10-CM | POA: Diagnosis not present

## 2018-07-07 LAB — URINALYSIS, COMPLETE
Bilirubin, UA: NEGATIVE
GLUCOSE, UA: NEGATIVE
Ketones, UA: NEGATIVE
Leukocytes, UA: NEGATIVE
Nitrite, UA: NEGATIVE
RBC, UA: NEGATIVE
Specific Gravity, UA: >1.03 — ABNORMAL HIGH (ref 1.005–1.030)
UUROB: 0.2 mg/dL (ref 0.2–1.0)
pH, UA: 5.5 (ref 5.0–7.5)

## 2018-07-07 LAB — MICROSCOPIC EXAMINATION
RBC, UA: NONE SEEN /hpf (ref 0–2)
WBC, UA: NONE SEEN /hpf (ref 0–5)

## 2018-07-07 LAB — BLADDER SCAN AMB NON-IMAGING: SCAN RESULT: 0

## 2018-07-07 NOTE — Progress Notes (Signed)
07/07/2018 10:32 AM   Sally Carroll March 21, 1934 [id number]  Referring provider: Tracie Carroll, [place] Sally Carroll [place], [place] [address]  No chief complaint on file.   HPI: Patient is an 84 year old Caucasian female with a history of hematuria and urge incontinence who presents today for a follow-up after being placed on a trial of Sally Carroll 25 mg daily, with daughter Sally Carroll.   Background history Patient is a 84 year old Caucasian female who presents today as a referral from Sally Carroll for microscopic hematuria with her sister-in-law, Sally Carroll.   Patient was found to have microscopic hematuria on 04/16/2018 with 4-10 RBC's/hpf with negative urine culture.  She does not have a prior history of recurrent urinary tract infections, nephrolithiasis, trauma to the genitourinary tract or malignancies of the genitourinary tract.  She does not have a family medical history of nephrolithiasis or hematuria.   Her brother had bladder cancer treated with BCG.   Today, she is having symptoms of strong urgency, nocturia x 2-3 and incontinence at night time.  Her UA today demonstrates no hematuria.  Patient denies any gross hematuria, dysuria or suprapubic/flank pain.  Patient denies any fevers, chills, nausea or vomiting.   She is not a smoker.   She was exposed to secondhand smoke as a child.    Hematuria work up completed in 06/2018 with CTU and cystoscopy   Today, the patient has been experiencing urgency x 0-3, frequency x 4-7,  is restricting fluids to avoid visits to the restroom, is engaging in toilet mapping, incontinence x 8 or more and nocturia x 0-3.   Main complaints today are frequency, urgency, nocturia and incontinence.  Patient denies any gross hematuria, dysuria or suprapubic/flank pain.  Patient denies any fevers, chills, nausea or vomiting.    PMH: Past Medical History:  Diagnosis Date  . Anemia   . Anxiety   . Arthritis   . Breast cancer ([place]) 2011   rt- radiation  . Cancer [facility]) 2011   rt breast  . Depression   . Dysrhythmia    CHB s/p PPM, afib history  . GERD (gastroesophageal reflux disease)   . Heart murmur   . Hypertension   . Personal history of radiation therapy   . Presence of permanent cardiac pacemaker     Surgical History: Past Surgical History:  Procedure Laterality Date  . BREAST BIOPSY Right 2011   right lumpectomy rad  . BREAST SURGERY Right    bx  . EYE SURGERY Bilateral    cataracts  . LUMBAR LAMINECTOMY/DECOMPRESSION MICRODISCECTOMY Right 02/10/2015   Procedure: Right Lumbar Four-Five Microdiskectomy;  Surgeon: Sally Levine, MD;  Location: [facility];  Service: Neurosurgery;  Laterality: Right;  Right L4-5 Microdiskectomy  . PACEMAKER INSERTION     99.07    Home Medications:  Allergies as of 07/07/2018      Reactions   Naproxen    Other reaction(s): Other (See Comments) GI upset   Sucralfate    Other reaction(s): Other (See Comments) Abdominal bloating   Prednisone Anxiety   Shakes      Medication List        Accurate as of 07/07/18 10:32 AM. Always use your most recent med list.          ALPRAZolam 0.25 MG tablet Commonly known as:  Sally Carroll Take 0.25 mg by mouth at bedtime.   Biotin 2500 MCG Caps Take 5,000 mcg by mouth daily.   carbidopa-levodopa 25-100 MG tablet Commonly known as:  Sally Carroll Take 1-2 tablets by mouth 3 (three) times daily. Takes 1 tablet at 1800 and 2 tablets at bedtime. May take an additionall tablet if needed   carvedilol 6.25 MG tablet Commonly known as:  Sally Carroll Take 3.125 mg by mouth 2 (two) times daily with a meal.   cholecalciferol 1000 units tablet Commonly known as:  Sally Carroll Take 1,000 Units by mouth 2 (two) times daily.   citalopram 10 MG tablet Commonly known as:  Sally Carroll Take 10 mg by mouth daily.   donepezil 5 MG tablet Commonly known as:  Sally Carroll Take 5 mg by mouth at bedtime.   ferrous sulfate 325 (65 FE) MG tablet Take 325 mg by mouth  daily with breakfast.   gabapentin 100 MG capsule Commonly known as:  Sally Carroll Take 200 mg by mouth 3 (three) times daily.   HYDROcodone-acetaminophen 10-325 MG tablet Commonly known as:  Sally Carroll Take 1 tablet by mouth 3 (three) times daily.   losartan 100 MG tablet Commonly known as:  Sally Carroll Take 100 mg by mouth daily.   losartan 50 MG tablet Commonly known as:  Sally Carroll Take 50 mg by mouth daily.   Melatonin 5 MG Tabs Take 5 mg by mouth.   multivitamin with minerals tablet Take 1 tablet by mouth daily.   Sally Carroll Caps Take 1 capsule by mouth 2 (two) times daily.   Sally Carroll Tabs Take 250 mg by mouth daily.   omeprazole 20 MG capsule Commonly known as:  Sally Carroll Take 20 mg by mouth daily. Reported on 11/09/2015   QUEtiapine 25 MG tablet Commonly known as:  Sally Carroll Take 25 mg by mouth at bedtime.   sennosides-docusate sodium 8.6-50 MG tablet Commonly known as:  Sally Carroll Take 1 tablet by mouth daily.   simvastatin 20 MG tablet Commonly known as:  Sally Carroll Take 10 mg by mouth daily.   traMADol 50 MG tablet Commonly known as:  Sally Carroll Take 0.5-1 tablets (25-50 mg total) by mouth every 6 (six) hours as needed.   traZODone 100 MG tablet Commonly known as:  Sally Carroll Take 100 mg by mouth at bedtime.   warfarin 5 MG tablet Commonly known as:  Sally Carroll Take 5 mg by mouth every evening.       Allergies:  Allergies  Allergen Reactions  . Naproxen     Other reaction(s): Other (See Comments) GI upset  . Sucralfate     Other reaction(s): Other (See Comments) Abdominal bloating  . Prednisone Anxiety    Shakes    Family History: Family History  Problem Relation Age of Onset  . COPD Mother   . Stroke Mother   . Atrial fibrillation Mother   . Lung cancer Father   . Breast cancer Neg Hx     Social History:  reports that she has never smoked. She has never used smokeless tobacco. She reports that she does not drink alcohol or use  drugs.  ROS: UROLOGY Frequent Urination?: Yes Hard to postpone urination?: Yes Burning/pain with urination?: No Get up at night to urinate?: Yes Leakage of urine?: Yes Urine stream starts and stops?: No Trouble starting stream?: No Do you have to strain to urinate?: No Blood in urine?: No Urinary tract infection?: Yes Sexually transmitted disease?: No Injury to kidneys or bladder?: No Painful intercourse?: No Weak stream?: No Currently pregnant?: No Vaginal bleeding?: No Last menstrual period?: n  Gastrointestinal Nausea?: No Vomiting?: No Indigestion/heartburn?: No Diarrhea?: No Constipation?: No  Constitutional Fever: No Night sweats?: No Weight  loss?: Yes Fatigue?: Yes  Skin Skin rash/lesions?: No Itching?: No  Eyes Blurred vision?: Yes  Ears/Nose/Throat Sore throat?: No Sinus problems?: No  Hematologic/Lymphatic Swollen glands?: No Easy bruising?: No  Cardiovascular Leg swelling?: No Chest pain?: No  Respiratory Cough?: Yes Shortness of breath?: No  Endocrine Excessive thirst?: No  Musculoskeletal Back pain?: No Joint pain?: No  Neurological Headaches?: No Dizziness?: Yes  Psychologic Depression?: Yes Anxiety?: Yes  Physical Exam: BP (!) 141/83 (BP Location: Left Arm, Patient Position: Sitting, Cuff Size: Normal)   Pulse 84   Ht 5\' 5"  (4.098 m)   Wt 123 lb 6.4 oz (56 kg)   BMI 20.53 kg/m   Constitutional: Well nourished. Alert and oriented, No acute distress. HEENT:  AT, moist mucus membranes. Trachea midline, no masses. Cardiovascular: No clubbing, cyanosis, or edema. Respiratory: Normal respiratory effort, no increased work of breathing. Skin: No rashes, bruises or suspicious lesions. Lymph: No cervical or inguinal adenopathy. Neurologic: Grossly intact, no focal deficits, moving all 4 extremities. Psychiatric: Normal mood and affect.  Laboratory Data: Lab Results  Component Value Date   WBC 7.9 11/11/2015   HGB  9.4 (L) 11/11/2015   HCT 28.0 (L) 11/11/2015   MCV 97.2 11/11/2015   PLT 138 (L) 11/11/2015    Lab Results  Component Value Date   CREATININE 1.00 05/26/2018    No results found for: PSA  No results found for: TESTOSTERONE  No results found for: HGBA1C  No results found for: TSH  No results found for: CHOL, HDL, CHOLHDL, VLDL, LDLCALC  Lab Results  Component Value Date   AST 31 11/09/2015   Lab Results  Component Value Date   ALT 16 11/09/2015   No components found for: ALKALINEPHOPHATASE No components found for: BILIRUBINTOTAL  No results found for: ESTRADIOL   Urinalysis Moderate bacteria.  Patient is likely colonized as she has no urinary symptoms today.  We will not send for culture.  See Epic.   I have reviewed the labs.  Assessment & Plan:    1. History of hematuria Hematuria work up completed in 06/2018 - findings positive for bilateral renal atrophy and cysts  No report of gross hematuria  UA today negative for hematuria RTC in one year for UA - patient to report any gross hematuria in the interim    2. Nocturia I explained to the patient that nocturia is often multi-factorial and difficult to treat.  Sleeping disorders, heart conditions, peripheral vascular disease, diabetes, an enlarged prostate for men, an urethral stricture causing bladder outlet obstruction and/or certain medications can contribute to nocturia. I have suggested that the patient avoid caffeine after noon and alcohol in the evening.  He or she may also benefit from fluid restrictions after 6:00 in the evening and voiding just prior to bedtime. I have explained that research studies have showed that over 84% of patients with sleep apnea reported frequent nighttime urination.   With sleep apnea, oxygen decreases, carbon dioxide increases, the blood become more acidic, the heart rate drops and blood vessels in the lung constrict.  The body is then alerted that something is very wrong. The  sleeper must wake enough to reopen the airway. By this time, the heart is racing and experiences a false signal of fluid overload. The heart excretes a hormone-like protein that tells the body to get rid of sodium and water, resulting in nocturia. I also informed the patient that a recent study noted that decreasing sodium intake to 2.3 grams daily,  if they don't have issues with hyponatremia, can also reduce the number of nightly voids There is also an increased incidence in sleep apnea with menopause, symptoms include night sweats, daytime sleepiness, depressed mood, and cognitive complaints like poor concentration or problems with short-term memory  The patient may benefit from a discussion with his or her primary care physician to see if he or she has risk factors for sleep apnea or other sleep disturbances and obtaining a sleep study.  3. Urge incontinence Could not tell a difference on the Sally Carroll - she does not continue the medication  Manage conservatively    Return in about 1 year (around 07/08/2019) for UA and symptom recheck .  These notes generated with voice recognition software. I apologize for typographical errors.  Zara Council, PA-C  [facility] [address] [place]  [place], [place] [address] [phone]

## 2018-07-16 DIAGNOSIS — R0982 Postnasal drip: Secondary | ICD-10-CM | POA: Diagnosis not present

## 2018-07-16 DIAGNOSIS — R0602 Shortness of breath: Secondary | ICD-10-CM | POA: Diagnosis not present

## 2018-07-16 DIAGNOSIS — R0789 Other chest pain: Secondary | ICD-10-CM | POA: Diagnosis not present

## 2018-07-24 ENCOUNTER — Emergency Department: Payer: Medicare HMO

## 2018-07-24 ENCOUNTER — Emergency Department
Admission: EM | Admit: 2018-07-24 | Discharge: 2018-07-24 | Disposition: A | Discharge status: Home / Self Care | Payer: Medicare HMO | Attending: Emergency Medicine | Admitting: Emergency Medicine

## 2018-07-24 DIAGNOSIS — Z853 Personal history of malignant neoplasm of breast: Secondary | ICD-10-CM | POA: Insufficient documentation

## 2018-07-24 DIAGNOSIS — Z923 Personal history of irradiation: Secondary | ICD-10-CM | POA: Insufficient documentation

## 2018-07-24 DIAGNOSIS — W01198A Fall on same level from slipping, tripping and stumbling with subsequent striking against other object, initial encounter: Secondary | ICD-10-CM | POA: Diagnosis not present

## 2018-07-24 DIAGNOSIS — M25552 Pain in left hip: Secondary | ICD-10-CM | POA: Insufficient documentation

## 2018-07-24 DIAGNOSIS — I10 Essential (primary) hypertension: Secondary | ICD-10-CM | POA: Insufficient documentation

## 2018-07-24 DIAGNOSIS — M25519 Pain in unspecified shoulder: Secondary | ICD-10-CM | POA: Diagnosis not present

## 2018-07-24 DIAGNOSIS — F0390 Unspecified dementia without behavioral disturbance: Secondary | ICD-10-CM | POA: Diagnosis not present

## 2018-07-24 DIAGNOSIS — Z79899 Other long term (current) drug therapy: Secondary | ICD-10-CM | POA: Insufficient documentation

## 2018-07-24 DIAGNOSIS — S0990XA Unspecified injury of head, initial encounter: Secondary | ICD-10-CM | POA: Diagnosis not present

## 2018-07-24 DIAGNOSIS — Y998 Other external cause status: Secondary | ICD-10-CM | POA: Diagnosis not present

## 2018-07-24 DIAGNOSIS — R5383 Other fatigue: Secondary | ICD-10-CM | POA: Diagnosis not present

## 2018-07-24 DIAGNOSIS — M545 Low back pain: Secondary | ICD-10-CM | POA: Diagnosis not present

## 2018-07-24 DIAGNOSIS — Z7901 Long term (current) use of anticoagulants: Secondary | ICD-10-CM | POA: Diagnosis not present

## 2018-07-24 DIAGNOSIS — Y9301 Activity, walking, marching and hiking: Secondary | ICD-10-CM | POA: Insufficient documentation

## 2018-07-24 DIAGNOSIS — Y92018 Other place in single-family (private) house as the place of occurrence of the external cause: Secondary | ICD-10-CM | POA: Diagnosis not present

## 2018-07-24 DIAGNOSIS — S3992XA Unspecified injury of lower back, initial encounter: Secondary | ICD-10-CM | POA: Diagnosis not present

## 2018-07-24 DIAGNOSIS — W19XXXA Unspecified fall, initial encounter: Secondary | ICD-10-CM | POA: Diagnosis not present

## 2018-07-24 DIAGNOSIS — G8929 Other chronic pain: Secondary | ICD-10-CM | POA: Insufficient documentation

## 2018-07-24 DIAGNOSIS — Z950 Presence of cardiac pacemaker: Secondary | ICD-10-CM | POA: Diagnosis not present

## 2018-07-24 DIAGNOSIS — S79912A Unspecified injury of left hip, initial encounter: Secondary | ICD-10-CM | POA: Diagnosis not present

## 2018-07-24 DIAGNOSIS — R51 Headache: Secondary | ICD-10-CM | POA: Diagnosis not present

## 2018-07-24 NOTE — ED Triage Notes (Signed)
Pt here s/p fall , denies loc , denies pain at this time .

## 2018-07-24 NOTE — ED Provider Notes (Signed)
[facility] Emergency Department Provider Note  Time seen: 7:14 PM  I have reviewed the triage vital signs and the nursing notes.   HISTORY  Chief Complaint Fall    HPI Sally Carroll is a 84 y.o. female with a past medical history of anemia, anxiety, depression, gastric reflux, hypertension, presents to the emergency department after a fall.  According to EMS report patient per family tripped on carpet falling forwards and possibly hitting her head on the ground.  Patient is on Coumadin.  Patient denies any symptoms at this time.  No signs of a head injury on examination.  It is not clear if the patient has dementia at baseline but is somewhat fatigued currently.  Awaiting family for further history.   Past Medical History:  Diagnosis Date  . Anemia   . Anxiety   . Arthritis   . Breast cancer ([place]) 2011   rt- radiation  . Cancer [facility]) 2011   rt breast  . Depression   . Dysrhythmia    CHB s/p PPM, afib history  . GERD (gastroesophageal reflux disease)   . Heart murmur   . Hypertension   . Personal history of radiation therapy   . Presence of permanent cardiac pacemaker     Patient Active Problem List   Diagnosis Date Noted  . Intermediate stage dry age-related macular degeneration of both eyes 07/01/2017  . Pterygium eye, left 04/02/2017  . Late onset Alzheimer's disease with behavioral disturbance ([place]) 11/01/2016  . Rotator cuff tendinitis, left 07/29/2016  . Lumbar spondylosis 05/27/2016  . Pneumonia 11/09/2015  . Herniated lumbar intervertebral disc 02/10/2015  . 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 Lumbar radiculitis 11/11/2014  . Primary osteoarthritis of right hip 10/24/2014  . Absolute anemia 01/14/2014  . Long term (current) use of anticoagulants 12/31/2013  . NSVT (nonsustained ventricular tachycardia) ([place]) 09/04/2011  . Anxiety and depression 08/29/2011  . A-fib ([place])  08/29/2011  . Breast CA ([place]) 08/29/2011  . Artificial cardiac pacemaker 08/29/2011  . Acquired complete AV block ([place]) 08/29/2011  . Hyperlipidemia, unspecified 08/29/2011  . Hypertension 08/29/2011  . Restless leg syndrome 08/29/2011    Past Surgical History:  Procedure Laterality Date  . BREAST BIOPSY Right 2011   right lumpectomy rad  . BREAST SURGERY Right    bx  . EYE SURGERY Bilateral    cataracts  . LUMBAR LAMINECTOMY/DECOMPRESSION MICRODISCECTOMY Right 02/10/2015   Procedure: Right Lumbar Four-Five Microdiskectomy;  Surgeon: Erline Levine, MD;  Location: [place];  Service: Neurosurgery;  Laterality: Right;  Right L4-5 Microdiskectomy  . PACEMAKER INSERTION     99.07    Prior to Admission medications   Medication Sig Start Date End Date Taking? Authorizing Provider  ALPRAZolam (XANAX) 0.25 MG tablet Take 0.25 mg by mouth at bedtime.    [provider]  Biotin 2500 MCG CAPS Take 5,000 mcg by mouth daily.     [provider]  carbidopa-levodopa (SINEMET IR) 25-100 MG per tablet Take 1-2 tablets by mouth 3 (three) times daily. Takes 1 tablet at 1800 and 2 tablets at bedtime. May take an additionall tablet if needed    [provider]  carvedilol (COREG) 6.25 MG tablet Take 3.125 mg by mouth 2 (two) times daily with a meal.    [provider]  cholecalciferol (VITAMIN D) 1000 UNITS tablet Take 1,000 Units by mouth 2 (two) times daily.    [provider]  citalopram (  CELEXA) 10 MG tablet Take 10 mg by mouth daily.    [provider]  donepezil (ARICEPT) 5 MG tablet Take 5 mg by mouth at bedtime.    [provider]  ferrous sulfate 325 (65 FE) MG tablet Take 325 mg by mouth daily with breakfast.    [provider]  gabapentin (NEURONTIN) 100 MG capsule Take 200 mg by mouth 3 (three) times daily.    [provider]  HYDROcodone-acetaminophen (NORCO) 10-325 MG per tablet Take 1 tablet by mouth 3  (three) times daily.    [provider]  losartan (COZAAR) 100 MG tablet Take 100 mg by mouth daily.    [provider]  losartan (COZAAR) 50 MG tablet Take 50 mg by mouth daily.    [provider]  Melatonin 5 MG TABS Take 5 mg by mouth.    [provider]  Multiple Vitamins-Minerals (CENTRUM SILVER 50+WOMEN) TABS Take 250 mg by mouth daily.    [provider]  Multiple Vitamins-Minerals (MULTIVITAMIN WITH MINERALS) tablet Take 1 tablet by mouth daily.    [provider]  Multiple Vitamins-Minerals (PRESERVISION AREDS) CAPS Take 1 capsule by mouth 2 (two) times daily.    [provider]  omeprazole (PRILOSEC) 20 MG capsule Take 20 mg by mouth daily. Reported on 11/09/2015    [provider]  QUEtiapine (SEROQUEL) 25 MG tablet Take 25 mg by mouth at bedtime.    [provider]  sennosides-docusate sodium (SENOKOT-S) 8.6-50 MG tablet Take 1 tablet by mouth daily.    [provider]  simvastatin (ZOCOR) 20 MG tablet Take 10 mg by mouth daily.    [provider]  traMADol (ULTRAM) 50 MG tablet Take 0.5-1 tablets (25-50 mg total) by mouth every 6 (six) hours as needed. Patient not taking: Reported on 07/07/2018 11/12/16   Duanne Guess, PA-C  traZODone (DESYREL) 100 MG tablet Take 100 mg by mouth at bedtime.    [provider]  warfarin (COUMADIN) 5 MG tablet Take 5 mg by mouth every evening.    [provider]    Allergies  Allergen Reactions  . Naproxen     Other reaction(s): Other (See Comments) GI upset  . Sucralfate     Other reaction(s): Other (See Comments) Abdominal bloating  . Prednisone Anxiety    Shakes    Family History  Problem Relation Age of Onset  . COPD Mother   . Stroke Mother   . Atrial fibrillation Mother   . Lung cancer Father   . Breast cancer Neg Hx     Social History Social History   Tobacco Use  . Smoking status: Never Smoker  . Smokeless  tobacco: Never Used  Substance Use Topics  . Alcohol use: No  . Drug use: No    Review of Systems Unable to obtain an adequate/accurate review of systems secondary to dementia. ____________________________________________   PHYSICAL EXAM:  Constitutional: Somewhat fatigued appearing but is able to answer questions and follow commands.  Patient does have baseline confusion. Eyes: Normal exam ENT   Head: Normocephalic and atraumatic.   Mouth/Throat: Mucous membranes are moist. Cardiovascular: Normal rate, regular rhythm. No murmur Respiratory: Normal respiratory effort without tachypnea nor retractions. Breath sounds are clear Gastrointestinal: Soft and nontender. No distention.   Musculoskeletal: Good range of motion all extremities without pain elicited.  Mild left hip tenderness to palpation, family states this is chronic. Neurologic:  Normal speech and language. No gross focal neurologic deficits  Skin: Small skin tear to right elbow. Psychiatric: Mood and affect are normal. Speech and behavior are normal.   ____________________________________________    RADIOLOGY  CT scan of the head is negative. X-rays of the pelvis, hip, lumbar spine are negative.  ____________________________________________   INITIAL IMPRESSION / ASSESSMENT AND PLAN / ED COURSE  Pertinent labs & imaging results that were available during my care of the patient were reviewed by me and considered in my medical decision making (see chart for details).  Patient presents to the emergency department after a fall.  Family is now here the patient states the patient does have advanced dementia, also took her nighttime sleeping medication prior to arrival.  They state the patient was walking when she tripped on the carpet transition from the hard with the carpet falling forwards.  No obvious signs of trauma on exam.  Patient does have mild left hip tenderness to palpation family states she was complaining  of left hip pain and lower back pain however this has been an ongoing issue times weeks.  Will obtain x-ray imaging of the lower back and left hip/pelvis as a precaution.  Will also obtain a CT scan of the head given the patient's anticoagulation status and reported head injury.  Patient and family agreeable to plan of care.  X-rays and CTs are negative.  Family states patient is acting at her baseline.  We will discharge home with routine follow-up.  Family agreeable to plan of care.  ____________________________________________   FINAL CLINICAL IMPRESSION(S) / ED DIAGNOSES  Cheral Marker, MD 07/24/18 2012

## 2018-08-27 DIAGNOSIS — G301 Alzheimer's disease with late onset: Secondary | ICD-10-CM | POA: Diagnosis not present

## 2018-08-27 DIAGNOSIS — I498 Other specified cardiac arrhythmias: Secondary | ICD-10-CM | POA: Diagnosis not present

## 2018-08-27 DIAGNOSIS — Z7901 Long term (current) use of anticoagulants: Secondary | ICD-10-CM | POA: Diagnosis not present

## 2018-08-27 DIAGNOSIS — E039 Hypothyroidism, unspecified: Secondary | ICD-10-CM | POA: Diagnosis not present

## 2018-08-27 DIAGNOSIS — S8992XA Unspecified injury of left lower leg, initial encounter: Secondary | ICD-10-CM | POA: Diagnosis not present

## 2018-08-27 DIAGNOSIS — Z9181 History of falling: Secondary | ICD-10-CM | POA: Diagnosis not present

## 2018-08-27 DIAGNOSIS — I10 Essential (primary) hypertension: Secondary | ICD-10-CM | POA: Diagnosis not present

## 2018-08-27 DIAGNOSIS — S99921A Unspecified injury of right foot, initial encounter: Secondary | ICD-10-CM | POA: Diagnosis not present

## 2018-08-27 DIAGNOSIS — Z950 Presence of cardiac pacemaker: Secondary | ICD-10-CM | POA: Diagnosis not present

## 2018-08-27 DIAGNOSIS — M25561 Pain in right knee: Secondary | ICD-10-CM | POA: Diagnosis not present

## 2018-08-27 DIAGNOSIS — D649 Anemia, unspecified: Secondary | ICD-10-CM | POA: Diagnosis not present

## 2018-08-27 DIAGNOSIS — F411 Generalized anxiety disorder: Secondary | ICD-10-CM | POA: Diagnosis not present

## 2018-08-27 DIAGNOSIS — R8290 Unspecified abnormal findings in urine: Secondary | ICD-10-CM | POA: Diagnosis not present

## 2018-08-27 DIAGNOSIS — W0110XA Fall on same level from slipping, tripping and stumbling with subsequent striking against unspecified object, initial encounter: Secondary | ICD-10-CM | POA: Diagnosis not present

## 2018-08-27 DIAGNOSIS — R2689 Other abnormalities of gait and mobility: Secondary | ICD-10-CM | POA: Diagnosis not present

## 2018-08-27 DIAGNOSIS — S8991XA Unspecified injury of right lower leg, initial encounter: Secondary | ICD-10-CM | POA: Diagnosis not present

## 2018-08-27 DIAGNOSIS — F0281 Dementia in other diseases classified elsewhere with behavioral disturbance: Secondary | ICD-10-CM | POA: Diagnosis not present

## 2018-08-27 DIAGNOSIS — M25562 Pain in left knee: Secondary | ICD-10-CM | POA: Diagnosis not present

## 2018-08-27 DIAGNOSIS — I480 Paroxysmal atrial fibrillation: Secondary | ICD-10-CM | POA: Diagnosis not present

## 2018-08-27 DIAGNOSIS — M79671 Pain in right foot: Secondary | ICD-10-CM | POA: Diagnosis not present

## 2018-08-27 DIAGNOSIS — F331 Major depressive disorder, recurrent, moderate: Secondary | ICD-10-CM | POA: Diagnosis not present

## 2018-08-27 DIAGNOSIS — M79672 Pain in left foot: Secondary | ICD-10-CM | POA: Diagnosis not present

## 2018-08-28 ENCOUNTER — Other Ambulatory Visit: Payer: Self-pay | Admitting: Internal Medicine

## 2018-08-28 DIAGNOSIS — Z9181 History of falling: Secondary | ICD-10-CM

## 2018-08-28 DIAGNOSIS — F02818 Dementia in other diseases classified elsewhere, unspecified severity, with other behavioral disturbance: Secondary | ICD-10-CM

## 2018-08-28 DIAGNOSIS — R2689 Other abnormalities of gait and mobility: Secondary | ICD-10-CM

## 2018-08-28 DIAGNOSIS — F0281 Dementia in other diseases classified elsewhere with behavioral disturbance: Secondary | ICD-10-CM

## 2018-08-28 DIAGNOSIS — G301 Alzheimer's disease with late onset: Secondary | ICD-10-CM

## 2018-08-28 DIAGNOSIS — M79671 Pain in right foot: Secondary | ICD-10-CM | POA: Diagnosis not present

## 2018-08-28 DIAGNOSIS — I498 Other specified cardiac arrhythmias: Secondary | ICD-10-CM

## 2018-08-28 DIAGNOSIS — Z950 Presence of cardiac pacemaker: Secondary | ICD-10-CM

## 2018-08-31 DIAGNOSIS — M79671 Pain in right foot: Secondary | ICD-10-CM | POA: Diagnosis not present

## 2018-09-01 ENCOUNTER — Ambulatory Visit
Admission: RE | Admit: 2018-09-01 | Discharge: 2018-09-01 | Disposition: A | Discharge status: Home / Self Care | Payer: Medicare HMO | Source: Ambulatory Visit | Attending: Internal Medicine | Admitting: Internal Medicine

## 2018-09-01 DIAGNOSIS — R2689 Other abnormalities of gait and mobility: Secondary | ICD-10-CM | POA: Diagnosis not present

## 2018-09-01 DIAGNOSIS — Z9181 History of falling: Secondary | ICD-10-CM | POA: Insufficient documentation

## 2018-09-01 DIAGNOSIS — F02818 Dementia in other diseases classified elsewhere, unspecified severity, with other behavioral disturbance: Secondary | ICD-10-CM

## 2018-09-01 DIAGNOSIS — I498 Other specified cardiac arrhythmias: Secondary | ICD-10-CM | POA: Insufficient documentation

## 2018-09-01 DIAGNOSIS — Z950 Presence of cardiac pacemaker: Secondary | ICD-10-CM | POA: Insufficient documentation

## 2018-09-01 DIAGNOSIS — F0281 Dementia in other diseases classified elsewhere with behavioral disturbance: Secondary | ICD-10-CM | POA: Insufficient documentation

## 2018-09-01 DIAGNOSIS — S0990XA Unspecified injury of head, initial encounter: Secondary | ICD-10-CM | POA: Diagnosis not present

## 2018-09-01 DIAGNOSIS — G301 Alzheimer's disease with late onset: Secondary | ICD-10-CM | POA: Insufficient documentation

## 2018-09-01 MED ORDER — IOHEXOL 300 MG/ML  SOLN
75.0000 mL | Freq: Once | INTRAMUSCULAR | Status: AC | PRN
  Administered 2018-09-01: 75 mL via INTRAVENOUS

## 2018-09-10 DIAGNOSIS — M159 Polyosteoarthritis, unspecified: Secondary | ICD-10-CM | POA: Diagnosis not present

## 2018-09-10 DIAGNOSIS — M6781 Other specified disorders of synovium and tendon, shoulder: Secondary | ICD-10-CM | POA: Diagnosis not present

## 2018-09-10 DIAGNOSIS — R2689 Other abnormalities of gait and mobility: Secondary | ICD-10-CM | POA: Diagnosis not present

## 2018-09-10 DIAGNOSIS — M6281 Muscle weakness (generalized): Secondary | ICD-10-CM | POA: Diagnosis not present

## 2018-09-10 DIAGNOSIS — R2681 Unsteadiness on feet: Secondary | ICD-10-CM | POA: Diagnosis not present

## 2018-09-15 DIAGNOSIS — R2689 Other abnormalities of gait and mobility: Secondary | ICD-10-CM | POA: Diagnosis not present

## 2018-09-15 DIAGNOSIS — M6781 Other specified disorders of synovium and tendon, shoulder: Secondary | ICD-10-CM | POA: Diagnosis not present

## 2018-09-15 DIAGNOSIS — R2681 Unsteadiness on feet: Secondary | ICD-10-CM | POA: Diagnosis not present

## 2018-09-15 DIAGNOSIS — Z7901 Long term (current) use of anticoagulants: Secondary | ICD-10-CM | POA: Diagnosis not present

## 2018-09-15 DIAGNOSIS — M159 Polyosteoarthritis, unspecified: Secondary | ICD-10-CM | POA: Diagnosis not present

## 2018-09-15 DIAGNOSIS — M6281 Muscle weakness (generalized): Secondary | ICD-10-CM | POA: Diagnosis not present

## 2018-09-16 DIAGNOSIS — N183 Chronic kidney disease, stage 3 (moderate): Secondary | ICD-10-CM | POA: Diagnosis not present

## 2018-09-16 DIAGNOSIS — I442 Atrioventricular block, complete: Secondary | ICD-10-CM | POA: Diagnosis not present

## 2018-09-16 DIAGNOSIS — R600 Localized edema: Secondary | ICD-10-CM | POA: Diagnosis not present

## 2018-09-16 DIAGNOSIS — I480 Paroxysmal atrial fibrillation: Secondary | ICD-10-CM | POA: Diagnosis not present

## 2018-09-16 DIAGNOSIS — I5021 Acute systolic (congestive) heart failure: Secondary | ICD-10-CM | POA: Diagnosis not present

## 2018-09-17 DIAGNOSIS — M159 Polyosteoarthritis, unspecified: Secondary | ICD-10-CM | POA: Diagnosis not present

## 2018-09-17 DIAGNOSIS — R2689 Other abnormalities of gait and mobility: Secondary | ICD-10-CM | POA: Diagnosis not present

## 2018-09-17 DIAGNOSIS — R2681 Unsteadiness on feet: Secondary | ICD-10-CM | POA: Diagnosis not present

## 2018-09-17 DIAGNOSIS — M6781 Other specified disorders of synovium and tendon, shoulder: Secondary | ICD-10-CM | POA: Diagnosis not present

## 2018-09-17 DIAGNOSIS — M6281 Muscle weakness (generalized): Secondary | ICD-10-CM | POA: Diagnosis not present

## 2018-09-22 DIAGNOSIS — M159 Polyosteoarthritis, unspecified: Secondary | ICD-10-CM | POA: Diagnosis not present

## 2018-09-22 DIAGNOSIS — R2681 Unsteadiness on feet: Secondary | ICD-10-CM | POA: Diagnosis not present

## 2018-09-22 DIAGNOSIS — R2689 Other abnormalities of gait and mobility: Secondary | ICD-10-CM | POA: Diagnosis not present

## 2018-09-22 DIAGNOSIS — M6781 Other specified disorders of synovium and tendon, shoulder: Secondary | ICD-10-CM | POA: Diagnosis not present

## 2018-09-22 DIAGNOSIS — M6281 Muscle weakness (generalized): Secondary | ICD-10-CM | POA: Diagnosis not present

## 2018-09-23 DIAGNOSIS — R2681 Unsteadiness on feet: Secondary | ICD-10-CM | POA: Diagnosis not present

## 2018-09-23 DIAGNOSIS — R2689 Other abnormalities of gait and mobility: Secondary | ICD-10-CM | POA: Diagnosis not present

## 2018-09-23 DIAGNOSIS — M6781 Other specified disorders of synovium and tendon, shoulder: Secondary | ICD-10-CM | POA: Diagnosis not present

## 2018-09-23 DIAGNOSIS — M159 Polyosteoarthritis, unspecified: Secondary | ICD-10-CM | POA: Diagnosis not present

## 2018-09-23 DIAGNOSIS — M6281 Muscle weakness (generalized): Secondary | ICD-10-CM | POA: Diagnosis not present

## 2018-09-30 DIAGNOSIS — M6281 Muscle weakness (generalized): Secondary | ICD-10-CM | POA: Diagnosis not present

## 2018-09-30 DIAGNOSIS — R2689 Other abnormalities of gait and mobility: Secondary | ICD-10-CM | POA: Diagnosis not present

## 2018-09-30 DIAGNOSIS — M6781 Other specified disorders of synovium and tendon, shoulder: Secondary | ICD-10-CM | POA: Diagnosis not present

## 2018-09-30 DIAGNOSIS — R2681 Unsteadiness on feet: Secondary | ICD-10-CM | POA: Diagnosis not present

## 2018-09-30 DIAGNOSIS — M159 Polyosteoarthritis, unspecified: Secondary | ICD-10-CM | POA: Diagnosis not present

## 2018-10-06 DIAGNOSIS — R791 Abnormal coagulation profile: Secondary | ICD-10-CM | POA: Diagnosis not present

## 2018-10-06 DIAGNOSIS — R296 Repeated falls: Secondary | ICD-10-CM | POA: Diagnosis not present

## 2018-10-06 DIAGNOSIS — R2681 Unsteadiness on feet: Secondary | ICD-10-CM | POA: Diagnosis not present

## 2018-10-06 DIAGNOSIS — M159 Polyosteoarthritis, unspecified: Secondary | ICD-10-CM | POA: Diagnosis not present

## 2018-10-06 DIAGNOSIS — Z7901 Long term (current) use of anticoagulants: Secondary | ICD-10-CM | POA: Diagnosis not present

## 2018-10-06 DIAGNOSIS — I480 Paroxysmal atrial fibrillation: Secondary | ICD-10-CM | POA: Diagnosis not present

## 2018-10-06 DIAGNOSIS — E7800 Pure hypercholesterolemia, unspecified: Secondary | ICD-10-CM | POA: Diagnosis not present

## 2018-10-06 DIAGNOSIS — M6281 Muscle weakness (generalized): Secondary | ICD-10-CM | POA: Diagnosis not present

## 2018-10-06 DIAGNOSIS — I10 Essential (primary) hypertension: Secondary | ICD-10-CM | POA: Diagnosis not present

## 2018-10-06 DIAGNOSIS — I5022 Chronic systolic (congestive) heart failure: Secondary | ICD-10-CM | POA: Diagnosis not present

## 2018-10-08 DIAGNOSIS — M159 Polyosteoarthritis, unspecified: Secondary | ICD-10-CM | POA: Diagnosis not present

## 2018-10-08 DIAGNOSIS — M6281 Muscle weakness (generalized): Secondary | ICD-10-CM | POA: Diagnosis not present

## 2018-10-08 DIAGNOSIS — R2681 Unsteadiness on feet: Secondary | ICD-10-CM | POA: Diagnosis not present

## 2018-10-08 DIAGNOSIS — R296 Repeated falls: Secondary | ICD-10-CM | POA: Diagnosis not present

## 2018-10-15 DIAGNOSIS — R296 Repeated falls: Secondary | ICD-10-CM | POA: Diagnosis not present

## 2018-10-15 DIAGNOSIS — M6281 Muscle weakness (generalized): Secondary | ICD-10-CM | POA: Diagnosis not present

## 2018-10-15 DIAGNOSIS — M159 Polyosteoarthritis, unspecified: Secondary | ICD-10-CM | POA: Diagnosis not present

## 2018-10-15 DIAGNOSIS — R2681 Unsteadiness on feet: Secondary | ICD-10-CM | POA: Diagnosis not present

## 2018-10-16 DIAGNOSIS — R296 Repeated falls: Secondary | ICD-10-CM | POA: Diagnosis not present

## 2018-10-16 DIAGNOSIS — M6281 Muscle weakness (generalized): Secondary | ICD-10-CM | POA: Diagnosis not present

## 2018-10-16 DIAGNOSIS — M159 Polyosteoarthritis, unspecified: Secondary | ICD-10-CM | POA: Diagnosis not present

## 2018-10-16 DIAGNOSIS — R2681 Unsteadiness on feet: Secondary | ICD-10-CM | POA: Diagnosis not present

## 2018-10-19 DIAGNOSIS — M159 Polyosteoarthritis, unspecified: Secondary | ICD-10-CM | POA: Diagnosis not present

## 2018-10-19 DIAGNOSIS — I482 Chronic atrial fibrillation, unspecified: Secondary | ICD-10-CM | POA: Diagnosis not present

## 2018-10-19 DIAGNOSIS — G2581 Restless legs syndrome: Secondary | ICD-10-CM | POA: Diagnosis not present

## 2018-10-19 DIAGNOSIS — R296 Repeated falls: Secondary | ICD-10-CM | POA: Diagnosis not present

## 2018-10-19 DIAGNOSIS — M6281 Muscle weakness (generalized): Secondary | ICD-10-CM | POA: Diagnosis not present

## 2018-10-19 DIAGNOSIS — G301 Alzheimer's disease with late onset: Secondary | ICD-10-CM | POA: Diagnosis not present

## 2018-10-19 DIAGNOSIS — F0281 Dementia in other diseases classified elsewhere with behavioral disturbance: Secondary | ICD-10-CM | POA: Diagnosis not present

## 2018-10-19 DIAGNOSIS — R791 Abnormal coagulation profile: Secondary | ICD-10-CM | POA: Diagnosis not present

## 2018-10-19 DIAGNOSIS — E538 Deficiency of other specified B group vitamins: Secondary | ICD-10-CM | POA: Diagnosis not present

## 2018-10-19 DIAGNOSIS — R2681 Unsteadiness on feet: Secondary | ICD-10-CM | POA: Diagnosis not present

## 2018-10-20 DIAGNOSIS — M159 Polyosteoarthritis, unspecified: Secondary | ICD-10-CM | POA: Diagnosis not present

## 2018-10-20 DIAGNOSIS — R296 Repeated falls: Secondary | ICD-10-CM | POA: Diagnosis not present

## 2018-10-20 DIAGNOSIS — R2681 Unsteadiness on feet: Secondary | ICD-10-CM | POA: Diagnosis not present

## 2018-10-20 DIAGNOSIS — M6281 Muscle weakness (generalized): Secondary | ICD-10-CM | POA: Diagnosis not present

## 2018-10-26 DIAGNOSIS — Z111 Encounter for screening for respiratory tuberculosis: Secondary | ICD-10-CM | POA: Diagnosis not present

## 2018-10-26 DIAGNOSIS — M159 Polyosteoarthritis, unspecified: Secondary | ICD-10-CM | POA: Diagnosis not present

## 2018-10-26 DIAGNOSIS — R8290 Unspecified abnormal findings in urine: Secondary | ICD-10-CM | POA: Diagnosis not present

## 2018-10-26 DIAGNOSIS — G2581 Restless legs syndrome: Secondary | ICD-10-CM | POA: Diagnosis not present

## 2018-10-26 DIAGNOSIS — I480 Paroxysmal atrial fibrillation: Secondary | ICD-10-CM | POA: Diagnosis not present

## 2018-10-26 DIAGNOSIS — R296 Repeated falls: Secondary | ICD-10-CM | POA: Diagnosis not present

## 2018-10-26 DIAGNOSIS — M6281 Muscle weakness (generalized): Secondary | ICD-10-CM | POA: Diagnosis not present

## 2018-10-26 DIAGNOSIS — I498 Other specified cardiac arrhythmias: Secondary | ICD-10-CM | POA: Diagnosis not present

## 2018-10-26 DIAGNOSIS — D649 Anemia, unspecified: Secondary | ICD-10-CM | POA: Diagnosis not present

## 2018-10-26 DIAGNOSIS — F0281 Dementia in other diseases classified elsewhere with behavioral disturbance: Secondary | ICD-10-CM | POA: Diagnosis not present

## 2018-10-26 DIAGNOSIS — G301 Alzheimer's disease with late onset: Secondary | ICD-10-CM | POA: Diagnosis not present

## 2018-10-26 DIAGNOSIS — Z0000 Encounter for general adult medical examination without abnormal findings: Secondary | ICD-10-CM | POA: Diagnosis not present

## 2018-10-26 DIAGNOSIS — R2681 Unsteadiness on feet: Secondary | ICD-10-CM | POA: Diagnosis not present

## 2018-11-02 DIAGNOSIS — M159 Polyosteoarthritis, unspecified: Secondary | ICD-10-CM | POA: Diagnosis not present

## 2018-11-02 DIAGNOSIS — R2681 Unsteadiness on feet: Secondary | ICD-10-CM | POA: Diagnosis not present

## 2018-11-02 DIAGNOSIS — M6281 Muscle weakness (generalized): Secondary | ICD-10-CM | POA: Diagnosis not present

## 2018-11-02 DIAGNOSIS — R2689 Other abnormalities of gait and mobility: Secondary | ICD-10-CM | POA: Diagnosis not present

## 2018-11-04 DIAGNOSIS — F325 Major depressive disorder, single episode, in full remission: Secondary | ICD-10-CM | POA: Diagnosis not present

## 2018-11-04 DIAGNOSIS — Z7901 Long term (current) use of anticoagulants: Secondary | ICD-10-CM | POA: Diagnosis not present

## 2018-11-04 DIAGNOSIS — Z0000 Encounter for general adult medical examination without abnormal findings: Secondary | ICD-10-CM | POA: Diagnosis not present

## 2018-11-04 DIAGNOSIS — N183 Chronic kidney disease, stage 3 (moderate): Secondary | ICD-10-CM | POA: Diagnosis not present

## 2018-11-04 DIAGNOSIS — I480 Paroxysmal atrial fibrillation: Secondary | ICD-10-CM | POA: Diagnosis not present

## 2018-11-04 DIAGNOSIS — D649 Anemia, unspecified: Secondary | ICD-10-CM | POA: Diagnosis not present

## 2018-11-04 DIAGNOSIS — I10 Essential (primary) hypertension: Secondary | ICD-10-CM | POA: Diagnosis not present

## 2018-11-04 DIAGNOSIS — G301 Alzheimer's disease with late onset: Secondary | ICD-10-CM | POA: Diagnosis not present

## 2018-11-04 DIAGNOSIS — I498 Other specified cardiac arrhythmias: Secondary | ICD-10-CM | POA: Diagnosis not present

## 2018-11-05 DIAGNOSIS — R2689 Other abnormalities of gait and mobility: Secondary | ICD-10-CM | POA: Diagnosis not present

## 2018-11-05 DIAGNOSIS — M6281 Muscle weakness (generalized): Secondary | ICD-10-CM | POA: Diagnosis not present

## 2018-11-05 DIAGNOSIS — M159 Polyosteoarthritis, unspecified: Secondary | ICD-10-CM | POA: Diagnosis not present

## 2018-11-05 DIAGNOSIS — R2681 Unsteadiness on feet: Secondary | ICD-10-CM | POA: Diagnosis not present

## 2018-11-13 DIAGNOSIS — R0902 Hypoxemia: Secondary | ICD-10-CM | POA: Diagnosis not present

## 2018-11-13 DIAGNOSIS — W19XXXA Unspecified fall, initial encounter: Secondary | ICD-10-CM | POA: Diagnosis not present

## 2018-11-13 DIAGNOSIS — R404 Transient alteration of awareness: Secondary | ICD-10-CM | POA: Diagnosis not present

## 2018-11-13 DIAGNOSIS — I10 Essential (primary) hypertension: Secondary | ICD-10-CM | POA: Diagnosis not present

## 2018-11-13 DIAGNOSIS — S0093XA Contusion of unspecified part of head, initial encounter: Secondary | ICD-10-CM | POA: Diagnosis not present

## 2018-11-14 ENCOUNTER — Emergency Department
Admission: EM | Admit: 2018-11-14 | Discharge: 2018-11-14 | Disposition: A | Discharge status: Home / Self Care | Payer: Medicare HMO | Attending: Emergency Medicine | Admitting: Emergency Medicine

## 2018-11-14 ENCOUNTER — Other Ambulatory Visit: Payer: Self-pay

## 2018-11-14 ENCOUNTER — Emergency Department: Payer: Medicare HMO

## 2018-11-14 DIAGNOSIS — S0093XA Contusion of unspecified part of head, initial encounter: Secondary | ICD-10-CM | POA: Diagnosis not present

## 2018-11-14 DIAGNOSIS — Z7901 Long term (current) use of anticoagulants: Secondary | ICD-10-CM | POA: Insufficient documentation

## 2018-11-14 DIAGNOSIS — G309 Alzheimer's disease, unspecified: Secondary | ICD-10-CM | POA: Diagnosis not present

## 2018-11-14 DIAGNOSIS — Y999 Unspecified external cause status: Secondary | ICD-10-CM | POA: Diagnosis not present

## 2018-11-14 DIAGNOSIS — S0990XA Unspecified injury of head, initial encounter: Secondary | ICD-10-CM | POA: Diagnosis present

## 2018-11-14 DIAGNOSIS — W19XXXA Unspecified fall, initial encounter: Secondary | ICD-10-CM | POA: Diagnosis not present

## 2018-11-14 DIAGNOSIS — Z79899 Other long term (current) drug therapy: Secondary | ICD-10-CM | POA: Diagnosis not present

## 2018-11-14 DIAGNOSIS — I10 Essential (primary) hypertension: Secondary | ICD-10-CM | POA: Insufficient documentation

## 2018-11-14 DIAGNOSIS — Z950 Presence of cardiac pacemaker: Secondary | ICD-10-CM | POA: Diagnosis not present

## 2018-11-14 DIAGNOSIS — S0083XA Contusion of other part of head, initial encounter: Secondary | ICD-10-CM | POA: Diagnosis not present

## 2018-11-14 DIAGNOSIS — Y939 Activity, unspecified: Secondary | ICD-10-CM | POA: Insufficient documentation

## 2018-11-14 DIAGNOSIS — Z043 Encounter for examination and observation following other accident: Secondary | ICD-10-CM | POA: Diagnosis not present

## 2018-11-14 DIAGNOSIS — Y92009 Unspecified place in unspecified non-institutional (private) residence as the place of occurrence of the external cause: Secondary | ICD-10-CM | POA: Insufficient documentation

## 2018-11-14 NOTE — ED Triage Notes (Signed)
Pt to ED via EMS from home place with unwitnessed fall. Pt has bruise to left cheek bone, is c/o pain on left side rib cage. Pt has dementia. NAD. VSS.

## 2018-11-14 NOTE — ED Provider Notes (Signed)
[facility] Emergency Department Provider Note   ____________________________________________   I have reviewed the triage vital signs and the nursing notes.   HISTORY  Chief Complaint Fall   History limited by and level 5 caveat due to: Dementia   HPI Sally Carroll is a 85 y.o. female who presents to the emergency department today after an unwitnessed fall. Patient is presenting from living facility. Patient cannot give any history secondary to dementia. Was noted by staff to have new bruise to left cheek bone.  Per medical record review patient has a history of alzheimer's.   Past Medical History:  Diagnosis Date  . Anemia   . Anxiety   . Arthritis   . Breast cancer ([place]) 2011   rt- radiation  . Cancer [facility]) 2011   rt breast  . Depression   . Dysrhythmia    CHB s/p PPM, afib history  . GERD (gastroesophageal reflux disease)   . Heart murmur   . Hypertension   . Personal history of radiation therapy   . Presence of permanent cardiac pacemaker     Patient Active Problem List   Diagnosis Date Noted  . Intermediate stage dry age-related macular degeneration of both eyes 07/01/2017  . Pterygium eye, left 04/02/2017  . Late onset Alzheimer's disease with behavioral disturbance ([place]) 11/01/2016  . Rotator cuff tendinitis, left 07/29/2016  . Lumbar spondylosis 05/27/2016  . Pneumonia 11/09/2015  . Herniated lumbar intervertebral disc 02/10/2015  . 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 Lumbar radiculitis 11/11/2014  . Primary osteoarthritis of right hip 10/24/2014  . Absolute anemia 01/14/2014  . Long term (current) use of anticoagulants 12/31/2013  . NSVT (nonsustained ventricular tachycardia) ([place]) 09/04/2011  . Anxiety and depression 08/29/2011  . A-fib ([place]) 08/29/2011  . Breast CA ([place]) 08/29/2011  . Artificial cardiac pacemaker 08/29/2011  . Acquired complete AV block ([place])  08/29/2011  . Hyperlipidemia, unspecified 08/29/2011  . Hypertension 08/29/2011  . Restless leg syndrome 08/29/2011    Past Surgical History:  Procedure Laterality Date  . BREAST BIOPSY Right 2011   right lumpectomy rad  . BREAST SURGERY Right    bx  . EYE SURGERY Bilateral    cataracts  . LUMBAR LAMINECTOMY/DECOMPRESSION MICRODISCECTOMY Right 02/10/2015   Procedure: Right Lumbar Four-Five Microdiskectomy;  Surgeon: Erline Levine, MD;  Location: [facility];  Service: Neurosurgery;  Laterality: Right;  Right L4-5 Microdiskectomy  . PACEMAKER INSERTION     99.07    Prior to Admission medications   Medication Sig Start Date End Date Taking? Authorizing Provider  ALPRAZolam (XANAX) 0.25 MG tablet Take 0.25 mg by mouth at bedtime.    [provider]  Biotin 2500 MCG CAPS Take 5,000 mcg by mouth daily.     [provider]  carbidopa-levodopa (SINEMET IR) 25-100 MG per tablet Take 1-2 tablets by mouth 3 (three) times daily. Takes 1 tablet at 1800 and 2 tablets at bedtime. May take an additionall tablet if needed    [provider]  carvedilol (COREG) 6.25 MG tablet Take 3.125 mg by mouth 2 (two) times daily with a meal.    [provider]  cholecalciferol (VITAMIN D) 1000 UNITS tablet Take 1,000 Units by mouth 2 (two) times daily.    [provider]  citalopram (CELEXA) 10 MG tablet Take 10 mg by mouth daily.    [provider]  donepezil (ARICEPT) 5 MG tablet Take 5 mg by mouth at  bedtime.    [provider]  ferrous sulfate 325 (65 FE) MG tablet Take 325 mg by mouth daily with breakfast.    [provider]  gabapentin (NEURONTIN) 100 MG capsule Take 200 mg by mouth 3 (three) times daily.    [provider]  HYDROcodone-acetaminophen (NORCO) 10-325 MG per tablet Take 1 tablet by mouth 3 (three) times daily.    [provider]  losartan (COZAAR) 100 MG tablet Take 100 mg by mouth daily.    [provider]  losartan (COZAAR) 50 MG tablet Take 50 mg by mouth daily.    [provider]  Melatonin 5 MG TABS Take 5 mg by mouth.    [provider]  Multiple Vitamins-Minerals (CENTRUM SILVER 50+WOMEN) TABS Take 250 mg by mouth daily.    [provider]  Multiple Vitamins-Minerals (MULTIVITAMIN WITH MINERALS) tablet Take 1 tablet by mouth daily.    [provider]  Multiple Vitamins-Minerals (PRESERVISION AREDS) CAPS Take 1 capsule by mouth 2 (two) times daily.    [provider]  omeprazole (PRILOSEC) 20 MG capsule Take 20 mg by mouth daily. Reported on 11/09/2015    [provider]  QUEtiapine (SEROQUEL) 25 MG tablet Take 25 mg by mouth at bedtime.    [provider]  sennosides-docusate sodium (SENOKOT-S) 8.6-50 MG tablet Take 1 tablet by mouth daily.    [provider]  simvastatin (ZOCOR) 20 MG tablet Take 10 mg by mouth daily.    [provider]  traMADol (ULTRAM) 50 MG tablet Take 0.5-1 tablets (25-50 mg total) by mouth every 6 (six) hours as needed. Patient not taking: Reported on 07/07/2018 11/12/16   Duanne Guess, PA-C  traZODone (DESYREL) 100 MG tablet Take 100 mg by mouth at bedtime.    [provider]  warfarin (COUMADIN) 5 MG tablet Take 5 mg by mouth every evening.    [provider]    Allergies Naproxen; Sucralfate; and Prednisone  Family History  Problem Relation Age of Onset  . COPD Mother   . Stroke Mother   . Atrial fibrillation Mother   . Lung cancer Father   . Breast cancer Neg Hx     Social History Social History   Tobacco Use  . Smoking status: Never Smoker  . Smokeless tobacco: Never Used  Substance Use Topics  . Alcohol use: No  . Drug use: No    Review of Systems Unable to obtain reliable ROS secondary to dementia.  ____________________________________________   PHYSICAL EXAM:  VITAL SIGNS: ED Triage Vitals [11/14/18 0003]  Enc Vitals  Group     BP (!) 153/92     Pulse Rate 87     Resp 16     Temp 99.1 F (37.3 C)     Temp Source Oral     SpO2 94 %     Weight 150 lb (68 kg)     Height 5\' 7"  (1.702 m)   Constitutional: Awake and alert.  Eyes: Conjunctivae are normal.  ENT      Head: Normocephalic. Bruise to left cheek.       Nose: No congestion/rhinnorhea.      Mouth/Throat: Mucous membranes are moist.      Neck: No stridor. No midline tenderness.  Hematological/Lymphatic/Immunilogical: No cervical lymphadenopathy. Cardiovascular: Normal rate, regular rhythm.  No murmurs, rubs, or gallops.  Respiratory: Normal respiratory effort without tachypnea nor retractions. Breath sounds are clear and equal bilaterally. No wheezes/rales/rhonchi. Gastrointestinal: Soft and  non tender. No rebound. No guarding.  Genitourinary: Deferred Musculoskeletal: Normal range of motion in all extremities. No lower extremity edema. No tenderness to palpation of spine or extremities. No deformity.  Neurologic:  Awake and alert. Not oriented. Following commands. No gross focal neurologic deficits are appreciated.  Skin:  Skin is warm, dry and intact. No rash noted.   ____________________________________________    LABS (pertinent positives/negatives)  None  ____________________________________________   EKG  I, Nance Pear, attending physician, personally viewed and interpreted this EKG  EKG Time: 0004 Rate: 82 Rhythm: atrial fibrillation, v paced rhythm Axis: left axis deviation Intervals: qtc 520 QRS: wide ST changes: no st elevation Impression: abnormal ekg  ____________________________________________    RADIOLOGY  CT head/c-spine/max face No acute intracranial process. No fracture.  ____________________________________________   PROCEDURES  Procedures  ____________________________________________   INITIAL IMPRESSION / ASSESSMENT AND PLAN / ED COURSE  Pertinent labs & imaging results that were  available during my care of the patient were reviewed by me and considered in my medical decision making (see chart for details).   Patient presented to the emergency department today after an unwitnessed fall.  Patient has Alzheimer's dementia and cannot give any history.  On exam she does have a small hematoma to the left cheek but no other acute traumatic injuries.  CT head max face and cervical spine without any fractures or acute intracranial process.  I discussed these findings with family.  At this point they feel comfortable deferring blood work and urine.  They state she just had them checked 1 week ago without any concerning findings.  I did discuss return precautions and things to look for that might initiate further testing.  ____________________________________________   FINAL CLINICAL IMPRESSION(S) / ED DIAGNOSES  Final diagnoses:  Fall, initial encounter  Contusion of other part of head, initial encounter     Note: This dictation was prepared with Diplomatic Services operational officer dictation. Any transcriptional errors that result from this process are unintentional     Nance Pear, MD 11/14/18 [id number]

## 2018-11-14 NOTE — Discharge Instructions (Addendum)
Please seek medical attention for any high fevers, chest pain, shortness of breath, change in behavior, persistent vomiting, bloody stool or any other new or concerning symptoms.  

## 2018-11-17 DIAGNOSIS — R399 Unspecified symptoms and signs involving the genitourinary system: Secondary | ICD-10-CM | POA: Diagnosis not present

## 2018-11-17 DIAGNOSIS — R8290 Unspecified abnormal findings in urine: Secondary | ICD-10-CM | POA: Diagnosis not present

## 2018-11-17 DIAGNOSIS — Z7401 Bed confinement status: Secondary | ICD-10-CM | POA: Diagnosis not present

## 2018-11-17 DIAGNOSIS — R509 Fever, unspecified: Secondary | ICD-10-CM | POA: Diagnosis not present

## 2018-12-07 DIAGNOSIS — M6281 Muscle weakness (generalized): Secondary | ICD-10-CM | POA: Diagnosis not present

## 2018-12-07 DIAGNOSIS — M158 Other polyosteoarthritis: Secondary | ICD-10-CM | POA: Diagnosis not present

## 2018-12-07 DIAGNOSIS — R296 Repeated falls: Secondary | ICD-10-CM | POA: Diagnosis not present

## 2018-12-16 DIAGNOSIS — M158 Other polyosteoarthritis: Secondary | ICD-10-CM | POA: Diagnosis not present

## 2018-12-16 DIAGNOSIS — R296 Repeated falls: Secondary | ICD-10-CM | POA: Diagnosis not present

## 2018-12-16 DIAGNOSIS — M6281 Muscle weakness (generalized): Secondary | ICD-10-CM | POA: Diagnosis not present

## 2018-12-17 DIAGNOSIS — R296 Repeated falls: Secondary | ICD-10-CM | POA: Diagnosis not present

## 2018-12-17 DIAGNOSIS — M6281 Muscle weakness (generalized): Secondary | ICD-10-CM | POA: Diagnosis not present

## 2018-12-17 DIAGNOSIS — M158 Other polyosteoarthritis: Secondary | ICD-10-CM | POA: Diagnosis not present

## 2018-12-21 DIAGNOSIS — M158 Other polyosteoarthritis: Secondary | ICD-10-CM | POA: Diagnosis not present

## 2018-12-21 DIAGNOSIS — R296 Repeated falls: Secondary | ICD-10-CM | POA: Diagnosis not present

## 2018-12-21 DIAGNOSIS — M6281 Muscle weakness (generalized): Secondary | ICD-10-CM | POA: Diagnosis not present

## 2018-12-29 DIAGNOSIS — N390 Urinary tract infection, site not specified: Secondary | ICD-10-CM | POA: Diagnosis not present

## 2018-12-29 DIAGNOSIS — R8290 Unspecified abnormal findings in urine: Secondary | ICD-10-CM | POA: Diagnosis not present

## 2018-12-29 DIAGNOSIS — M158 Other polyosteoarthritis: Secondary | ICD-10-CM | POA: Diagnosis not present

## 2018-12-29 DIAGNOSIS — M6281 Muscle weakness (generalized): Secondary | ICD-10-CM | POA: Diagnosis not present

## 2018-12-29 DIAGNOSIS — R296 Repeated falls: Secondary | ICD-10-CM | POA: Diagnosis not present

## 2019-01-05 DIAGNOSIS — R296 Repeated falls: Secondary | ICD-10-CM | POA: Diagnosis not present

## 2019-01-05 DIAGNOSIS — M6281 Muscle weakness (generalized): Secondary | ICD-10-CM | POA: Diagnosis not present

## 2019-01-05 DIAGNOSIS — R2689 Other abnormalities of gait and mobility: Secondary | ICD-10-CM | POA: Diagnosis not present

## 2019-01-05 DIAGNOSIS — R2681 Unsteadiness on feet: Secondary | ICD-10-CM | POA: Diagnosis not present

## 2019-01-14 DIAGNOSIS — Z7901 Long term (current) use of anticoagulants: Secondary | ICD-10-CM | POA: Diagnosis not present

## 2019-01-18 DIAGNOSIS — Z139 Encounter for screening, unspecified: Secondary | ICD-10-CM | POA: Diagnosis not present

## 2019-02-24 DIAGNOSIS — F331 Major depressive disorder, recurrent, moderate: Secondary | ICD-10-CM | POA: Diagnosis not present

## 2019-02-24 DIAGNOSIS — F0281 Dementia in other diseases classified elsewhere with behavioral disturbance: Secondary | ICD-10-CM | POA: Diagnosis not present

## 2019-02-24 DIAGNOSIS — G301 Alzheimer's disease with late onset: Secondary | ICD-10-CM | POA: Diagnosis not present

## 2019-04-29 DIAGNOSIS — Z20828 Contact with and (suspected) exposure to other viral communicable diseases: Secondary | ICD-10-CM | POA: Diagnosis not present

## 2019-05-13 DIAGNOSIS — Z20828 Contact with and (suspected) exposure to other viral communicable diseases: Secondary | ICD-10-CM | POA: Diagnosis not present

## 2019-05-20 DIAGNOSIS — I480 Paroxysmal atrial fibrillation: Secondary | ICD-10-CM | POA: Diagnosis not present

## 2019-05-20 DIAGNOSIS — G301 Alzheimer's disease with late onset: Secondary | ICD-10-CM | POA: Diagnosis not present

## 2019-05-20 DIAGNOSIS — I498 Other specified cardiac arrhythmias: Secondary | ICD-10-CM | POA: Diagnosis not present

## 2019-05-20 DIAGNOSIS — R399 Unspecified symptoms and signs involving the genitourinary system: Secondary | ICD-10-CM | POA: Diagnosis not present

## 2019-05-20 DIAGNOSIS — N183 Chronic kidney disease, stage 3 (moderate): Secondary | ICD-10-CM | POA: Diagnosis not present

## 2019-05-20 DIAGNOSIS — I10 Essential (primary) hypertension: Secondary | ICD-10-CM | POA: Diagnosis not present

## 2019-05-20 DIAGNOSIS — D649 Anemia, unspecified: Secondary | ICD-10-CM | POA: Diagnosis not present

## 2019-05-20 DIAGNOSIS — Z7901 Long term (current) use of anticoagulants: Secondary | ICD-10-CM | POA: Diagnosis not present

## 2019-05-20 DIAGNOSIS — F325 Major depressive disorder, single episode, in full remission: Secondary | ICD-10-CM | POA: Diagnosis not present

## 2019-05-20 DIAGNOSIS — R509 Fever, unspecified: Secondary | ICD-10-CM | POA: Diagnosis not present

## 2019-05-20 DIAGNOSIS — L600 Ingrowing nail: Secondary | ICD-10-CM | POA: Diagnosis not present

## 2019-05-20 DIAGNOSIS — F4489 Other dissociative and conversion disorders: Secondary | ICD-10-CM | POA: Diagnosis not present

## 2019-05-27 DIAGNOSIS — F419 Anxiety disorder, unspecified: Secondary | ICD-10-CM | POA: Diagnosis not present

## 2019-05-27 DIAGNOSIS — D649 Anemia, unspecified: Secondary | ICD-10-CM | POA: Diagnosis not present

## 2019-05-27 DIAGNOSIS — G301 Alzheimer's disease with late onset: Secondary | ICD-10-CM | POA: Diagnosis not present

## 2019-05-27 DIAGNOSIS — I4891 Unspecified atrial fibrillation: Secondary | ICD-10-CM | POA: Diagnosis not present

## 2019-05-27 DIAGNOSIS — E785 Hyperlipidemia, unspecified: Secondary | ICD-10-CM | POA: Diagnosis not present

## 2019-05-27 DIAGNOSIS — Z20828 Contact with and (suspected) exposure to other viral communicable diseases: Secondary | ICD-10-CM | POA: Diagnosis not present

## 2019-05-27 DIAGNOSIS — M545 Low back pain: Secondary | ICD-10-CM | POA: Diagnosis not present

## 2019-05-27 DIAGNOSIS — F329 Major depressive disorder, single episode, unspecified: Secondary | ICD-10-CM | POA: Diagnosis not present

## 2019-05-27 DIAGNOSIS — F0281 Dementia in other diseases classified elsewhere with behavioral disturbance: Secondary | ICD-10-CM | POA: Diagnosis not present

## 2019-05-27 DIAGNOSIS — N289 Disorder of kidney and ureter, unspecified: Secondary | ICD-10-CM | POA: Diagnosis not present

## 2019-06-03 DIAGNOSIS — Z20828 Contact with and (suspected) exposure to other viral communicable diseases: Secondary | ICD-10-CM | POA: Diagnosis not present

## 2019-06-09 DIAGNOSIS — Z20828 Contact with and (suspected) exposure to other viral communicable diseases: Secondary | ICD-10-CM | POA: Diagnosis not present

## 2019-07-08 ENCOUNTER — Ambulatory Visit: Payer: Medicare HMO | Admitting: Urology

## 2019-07-08 DIAGNOSIS — Z20828 Contact with and (suspected) exposure to other viral communicable diseases: Secondary | ICD-10-CM | POA: Diagnosis not present

## 2019-07-09 DIAGNOSIS — Z20828 Contact with and (suspected) exposure to other viral communicable diseases: Secondary | ICD-10-CM | POA: Diagnosis not present

## 2019-07-22 DIAGNOSIS — Z20828 Contact with and (suspected) exposure to other viral communicable diseases: Secondary | ICD-10-CM | POA: Diagnosis not present

## 2019-07-23 DIAGNOSIS — Z20828 Contact with and (suspected) exposure to other viral communicable diseases: Secondary | ICD-10-CM | POA: Diagnosis not present

## 2019-07-30 DIAGNOSIS — Z20828 Contact with and (suspected) exposure to other viral communicable diseases: Secondary | ICD-10-CM | POA: Diagnosis not present

## 2019-08-09 DIAGNOSIS — M6281 Muscle weakness (generalized): Secondary | ICD-10-CM | POA: Diagnosis not present

## 2019-08-09 DIAGNOSIS — R278 Other lack of coordination: Secondary | ICD-10-CM | POA: Diagnosis not present

## 2019-08-09 DIAGNOSIS — I442 Atrioventricular block, complete: Secondary | ICD-10-CM | POA: Diagnosis not present

## 2019-08-09 DIAGNOSIS — R2689 Other abnormalities of gait and mobility: Secondary | ICD-10-CM | POA: Diagnosis not present

## 2019-08-09 DIAGNOSIS — M5440 Lumbago with sciatica, unspecified side: Secondary | ICD-10-CM | POA: Diagnosis not present

## 2019-08-09 DIAGNOSIS — R2681 Unsteadiness on feet: Secondary | ICD-10-CM | POA: Diagnosis not present

## 2019-08-09 DIAGNOSIS — M545 Low back pain: Secondary | ICD-10-CM | POA: Diagnosis not present

## 2019-08-09 DIAGNOSIS — D638 Anemia in other chronic diseases classified elsewhere: Secondary | ICD-10-CM | POA: Diagnosis not present

## 2019-08-09 DIAGNOSIS — I10 Essential (primary) hypertension: Secondary | ICD-10-CM | POA: Diagnosis not present

## 2019-08-10 DIAGNOSIS — Z20828 Contact with and (suspected) exposure to other viral communicable diseases: Secondary | ICD-10-CM | POA: Diagnosis not present

## 2019-08-11 DIAGNOSIS — R2681 Unsteadiness on feet: Secondary | ICD-10-CM | POA: Diagnosis not present

## 2019-08-11 DIAGNOSIS — I10 Essential (primary) hypertension: Secondary | ICD-10-CM | POA: Diagnosis not present

## 2019-08-11 DIAGNOSIS — R2689 Other abnormalities of gait and mobility: Secondary | ICD-10-CM | POA: Diagnosis not present

## 2019-08-11 DIAGNOSIS — M545 Low back pain: Secondary | ICD-10-CM | POA: Diagnosis not present

## 2019-08-11 DIAGNOSIS — D638 Anemia in other chronic diseases classified elsewhere: Secondary | ICD-10-CM | POA: Diagnosis not present

## 2019-08-11 DIAGNOSIS — I442 Atrioventricular block, complete: Secondary | ICD-10-CM | POA: Diagnosis not present

## 2019-08-11 DIAGNOSIS — M5440 Lumbago with sciatica, unspecified side: Secondary | ICD-10-CM | POA: Diagnosis not present

## 2019-08-11 DIAGNOSIS — M6281 Muscle weakness (generalized): Secondary | ICD-10-CM | POA: Diagnosis not present

## 2019-08-11 DIAGNOSIS — Z20828 Contact with and (suspected) exposure to other viral communicable diseases: Secondary | ICD-10-CM | POA: Diagnosis not present

## 2019-08-11 DIAGNOSIS — R278 Other lack of coordination: Secondary | ICD-10-CM | POA: Diagnosis not present

## 2019-08-18 DIAGNOSIS — R2681 Unsteadiness on feet: Secondary | ICD-10-CM | POA: Diagnosis not present

## 2019-08-18 DIAGNOSIS — I442 Atrioventricular block, complete: Secondary | ICD-10-CM | POA: Diagnosis not present

## 2019-08-18 DIAGNOSIS — M545 Low back pain: Secondary | ICD-10-CM | POA: Diagnosis not present

## 2019-08-18 DIAGNOSIS — M6281 Muscle weakness (generalized): Secondary | ICD-10-CM | POA: Diagnosis not present

## 2019-08-18 DIAGNOSIS — I10 Essential (primary) hypertension: Secondary | ICD-10-CM | POA: Diagnosis not present

## 2019-08-18 DIAGNOSIS — D638 Anemia in other chronic diseases classified elsewhere: Secondary | ICD-10-CM | POA: Diagnosis not present

## 2019-08-18 DIAGNOSIS — R2689 Other abnormalities of gait and mobility: Secondary | ICD-10-CM | POA: Diagnosis not present

## 2019-08-18 DIAGNOSIS — M5440 Lumbago with sciatica, unspecified side: Secondary | ICD-10-CM | POA: Diagnosis not present

## 2019-08-18 DIAGNOSIS — R278 Other lack of coordination: Secondary | ICD-10-CM | POA: Diagnosis not present

## 2019-08-19 DIAGNOSIS — R2681 Unsteadiness on feet: Secondary | ICD-10-CM | POA: Diagnosis not present

## 2019-08-19 DIAGNOSIS — M5440 Lumbago with sciatica, unspecified side: Secondary | ICD-10-CM | POA: Diagnosis not present

## 2019-08-19 DIAGNOSIS — R2689 Other abnormalities of gait and mobility: Secondary | ICD-10-CM | POA: Diagnosis not present

## 2019-08-19 DIAGNOSIS — I10 Essential (primary) hypertension: Secondary | ICD-10-CM | POA: Diagnosis not present

## 2019-08-19 DIAGNOSIS — M545 Low back pain: Secondary | ICD-10-CM | POA: Diagnosis not present

## 2019-08-19 DIAGNOSIS — I442 Atrioventricular block, complete: Secondary | ICD-10-CM | POA: Diagnosis not present

## 2019-08-19 DIAGNOSIS — M6281 Muscle weakness (generalized): Secondary | ICD-10-CM | POA: Diagnosis not present

## 2019-08-19 DIAGNOSIS — R278 Other lack of coordination: Secondary | ICD-10-CM | POA: Diagnosis not present

## 2019-08-19 DIAGNOSIS — D638 Anemia in other chronic diseases classified elsewhere: Secondary | ICD-10-CM | POA: Diagnosis not present

## 2019-08-20 DIAGNOSIS — M5440 Lumbago with sciatica, unspecified side: Secondary | ICD-10-CM | POA: Diagnosis not present

## 2019-08-20 DIAGNOSIS — R2681 Unsteadiness on feet: Secondary | ICD-10-CM | POA: Diagnosis not present

## 2019-08-20 DIAGNOSIS — R2689 Other abnormalities of gait and mobility: Secondary | ICD-10-CM | POA: Diagnosis not present

## 2019-08-20 DIAGNOSIS — I10 Essential (primary) hypertension: Secondary | ICD-10-CM | POA: Diagnosis not present

## 2019-08-20 DIAGNOSIS — R278 Other lack of coordination: Secondary | ICD-10-CM | POA: Diagnosis not present

## 2019-08-20 DIAGNOSIS — M6281 Muscle weakness (generalized): Secondary | ICD-10-CM | POA: Diagnosis not present

## 2019-08-20 DIAGNOSIS — D638 Anemia in other chronic diseases classified elsewhere: Secondary | ICD-10-CM | POA: Diagnosis not present

## 2019-08-20 DIAGNOSIS — M545 Low back pain: Secondary | ICD-10-CM | POA: Diagnosis not present

## 2019-08-20 DIAGNOSIS — I442 Atrioventricular block, complete: Secondary | ICD-10-CM | POA: Diagnosis not present

## 2019-08-23 DIAGNOSIS — M545 Low back pain: Secondary | ICD-10-CM | POA: Diagnosis not present

## 2019-08-23 DIAGNOSIS — M5440 Lumbago with sciatica, unspecified side: Secondary | ICD-10-CM | POA: Diagnosis not present

## 2019-08-23 DIAGNOSIS — I442 Atrioventricular block, complete: Secondary | ICD-10-CM | POA: Diagnosis not present

## 2019-08-23 DIAGNOSIS — R278 Other lack of coordination: Secondary | ICD-10-CM | POA: Diagnosis not present

## 2019-08-23 DIAGNOSIS — R2689 Other abnormalities of gait and mobility: Secondary | ICD-10-CM | POA: Diagnosis not present

## 2019-08-23 DIAGNOSIS — D638 Anemia in other chronic diseases classified elsewhere: Secondary | ICD-10-CM | POA: Diagnosis not present

## 2019-08-23 DIAGNOSIS — I10 Essential (primary) hypertension: Secondary | ICD-10-CM | POA: Diagnosis not present

## 2019-08-23 DIAGNOSIS — M6281 Muscle weakness (generalized): Secondary | ICD-10-CM | POA: Diagnosis not present

## 2019-08-23 DIAGNOSIS — R2681 Unsteadiness on feet: Secondary | ICD-10-CM | POA: Diagnosis not present

## 2019-08-24 DIAGNOSIS — R2681 Unsteadiness on feet: Secondary | ICD-10-CM | POA: Diagnosis not present

## 2019-08-24 DIAGNOSIS — M5440 Lumbago with sciatica, unspecified side: Secondary | ICD-10-CM | POA: Diagnosis not present

## 2019-08-24 DIAGNOSIS — D638 Anemia in other chronic diseases classified elsewhere: Secondary | ICD-10-CM | POA: Diagnosis not present

## 2019-08-24 DIAGNOSIS — M6281 Muscle weakness (generalized): Secondary | ICD-10-CM | POA: Diagnosis not present

## 2019-08-24 DIAGNOSIS — M545 Low back pain: Secondary | ICD-10-CM | POA: Diagnosis not present

## 2019-08-24 DIAGNOSIS — I10 Essential (primary) hypertension: Secondary | ICD-10-CM | POA: Diagnosis not present

## 2019-08-24 DIAGNOSIS — R278 Other lack of coordination: Secondary | ICD-10-CM | POA: Diagnosis not present

## 2019-08-24 DIAGNOSIS — R2689 Other abnormalities of gait and mobility: Secondary | ICD-10-CM | POA: Diagnosis not present

## 2019-08-24 DIAGNOSIS — I442 Atrioventricular block, complete: Secondary | ICD-10-CM | POA: Diagnosis not present

## 2019-08-25 DIAGNOSIS — I442 Atrioventricular block, complete: Secondary | ICD-10-CM | POA: Diagnosis not present

## 2019-08-25 DIAGNOSIS — R278 Other lack of coordination: Secondary | ICD-10-CM | POA: Diagnosis not present

## 2019-08-25 DIAGNOSIS — R2689 Other abnormalities of gait and mobility: Secondary | ICD-10-CM | POA: Diagnosis not present

## 2019-08-25 DIAGNOSIS — M5440 Lumbago with sciatica, unspecified side: Secondary | ICD-10-CM | POA: Diagnosis not present

## 2019-08-25 DIAGNOSIS — I10 Essential (primary) hypertension: Secondary | ICD-10-CM | POA: Diagnosis not present

## 2019-08-25 DIAGNOSIS — M545 Low back pain: Secondary | ICD-10-CM | POA: Diagnosis not present

## 2019-08-25 DIAGNOSIS — M6281 Muscle weakness (generalized): Secondary | ICD-10-CM | POA: Diagnosis not present

## 2019-08-25 DIAGNOSIS — R2681 Unsteadiness on feet: Secondary | ICD-10-CM | POA: Diagnosis not present

## 2019-08-25 DIAGNOSIS — D638 Anemia in other chronic diseases classified elsewhere: Secondary | ICD-10-CM | POA: Diagnosis not present

## 2019-08-30 DIAGNOSIS — R2689 Other abnormalities of gait and mobility: Secondary | ICD-10-CM | POA: Diagnosis not present

## 2019-08-30 DIAGNOSIS — M545 Low back pain: Secondary | ICD-10-CM | POA: Diagnosis not present

## 2019-08-30 DIAGNOSIS — D638 Anemia in other chronic diseases classified elsewhere: Secondary | ICD-10-CM | POA: Diagnosis not present

## 2019-08-30 DIAGNOSIS — R2681 Unsteadiness on feet: Secondary | ICD-10-CM | POA: Diagnosis not present

## 2019-08-30 DIAGNOSIS — I10 Essential (primary) hypertension: Secondary | ICD-10-CM | POA: Diagnosis not present

## 2019-08-30 DIAGNOSIS — M5440 Lumbago with sciatica, unspecified side: Secondary | ICD-10-CM | POA: Diagnosis not present

## 2019-08-30 DIAGNOSIS — M6281 Muscle weakness (generalized): Secondary | ICD-10-CM | POA: Diagnosis not present

## 2019-08-30 DIAGNOSIS — R278 Other lack of coordination: Secondary | ICD-10-CM | POA: Diagnosis not present

## 2019-08-30 DIAGNOSIS — I442 Atrioventricular block, complete: Secondary | ICD-10-CM | POA: Diagnosis not present

## 2019-08-31 DIAGNOSIS — M5440 Lumbago with sciatica, unspecified side: Secondary | ICD-10-CM | POA: Diagnosis not present

## 2019-08-31 DIAGNOSIS — R2689 Other abnormalities of gait and mobility: Secondary | ICD-10-CM | POA: Diagnosis not present

## 2019-08-31 DIAGNOSIS — D638 Anemia in other chronic diseases classified elsewhere: Secondary | ICD-10-CM | POA: Diagnosis not present

## 2019-08-31 DIAGNOSIS — M545 Low back pain: Secondary | ICD-10-CM | POA: Diagnosis not present

## 2019-08-31 DIAGNOSIS — M6281 Muscle weakness (generalized): Secondary | ICD-10-CM | POA: Diagnosis not present

## 2019-08-31 DIAGNOSIS — I442 Atrioventricular block, complete: Secondary | ICD-10-CM | POA: Diagnosis not present

## 2019-08-31 DIAGNOSIS — R2681 Unsteadiness on feet: Secondary | ICD-10-CM | POA: Diagnosis not present

## 2019-08-31 DIAGNOSIS — R278 Other lack of coordination: Secondary | ICD-10-CM | POA: Diagnosis not present

## 2019-08-31 DIAGNOSIS — I10 Essential (primary) hypertension: Secondary | ICD-10-CM | POA: Diagnosis not present

## 2019-09-01 DIAGNOSIS — D638 Anemia in other chronic diseases classified elsewhere: Secondary | ICD-10-CM | POA: Diagnosis not present

## 2019-09-01 DIAGNOSIS — R2681 Unsteadiness on feet: Secondary | ICD-10-CM | POA: Diagnosis not present

## 2019-09-01 DIAGNOSIS — R2689 Other abnormalities of gait and mobility: Secondary | ICD-10-CM | POA: Diagnosis not present

## 2019-09-01 DIAGNOSIS — M6281 Muscle weakness (generalized): Secondary | ICD-10-CM | POA: Diagnosis not present

## 2019-09-01 DIAGNOSIS — I10 Essential (primary) hypertension: Secondary | ICD-10-CM | POA: Diagnosis not present

## 2019-09-01 DIAGNOSIS — I442 Atrioventricular block, complete: Secondary | ICD-10-CM | POA: Diagnosis not present

## 2019-09-01 DIAGNOSIS — M545 Low back pain: Secondary | ICD-10-CM | POA: Diagnosis not present

## 2019-09-01 DIAGNOSIS — R278 Other lack of coordination: Secondary | ICD-10-CM | POA: Diagnosis not present

## 2019-09-01 DIAGNOSIS — M5440 Lumbago with sciatica, unspecified side: Secondary | ICD-10-CM | POA: Diagnosis not present

## 2019-09-02 DIAGNOSIS — M6281 Muscle weakness (generalized): Secondary | ICD-10-CM | POA: Diagnosis not present

## 2019-09-02 DIAGNOSIS — R2689 Other abnormalities of gait and mobility: Secondary | ICD-10-CM | POA: Diagnosis not present

## 2019-09-02 DIAGNOSIS — I442 Atrioventricular block, complete: Secondary | ICD-10-CM | POA: Diagnosis not present

## 2019-09-02 DIAGNOSIS — R278 Other lack of coordination: Secondary | ICD-10-CM | POA: Diagnosis not present

## 2019-09-02 DIAGNOSIS — R2681 Unsteadiness on feet: Secondary | ICD-10-CM | POA: Diagnosis not present

## 2019-09-02 DIAGNOSIS — D638 Anemia in other chronic diseases classified elsewhere: Secondary | ICD-10-CM | POA: Diagnosis not present

## 2019-09-02 DIAGNOSIS — M545 Low back pain: Secondary | ICD-10-CM | POA: Diagnosis not present

## 2019-09-02 DIAGNOSIS — M5440 Lumbago with sciatica, unspecified side: Secondary | ICD-10-CM | POA: Diagnosis not present

## 2019-09-02 DIAGNOSIS — Z20828 Contact with and (suspected) exposure to other viral communicable diseases: Secondary | ICD-10-CM | POA: Diagnosis not present

## 2019-09-02 DIAGNOSIS — I10 Essential (primary) hypertension: Secondary | ICD-10-CM | POA: Diagnosis not present

## 2019-09-04 DIAGNOSIS — Z20828 Contact with and (suspected) exposure to other viral communicable diseases: Secondary | ICD-10-CM | POA: Diagnosis not present

## 2019-09-06 DIAGNOSIS — M6281 Muscle weakness (generalized): Secondary | ICD-10-CM | POA: Diagnosis not present

## 2019-09-06 DIAGNOSIS — M545 Low back pain: Secondary | ICD-10-CM | POA: Diagnosis not present

## 2019-09-06 DIAGNOSIS — R2681 Unsteadiness on feet: Secondary | ICD-10-CM | POA: Diagnosis not present

## 2019-09-06 DIAGNOSIS — R278 Other lack of coordination: Secondary | ICD-10-CM | POA: Diagnosis not present

## 2019-09-06 DIAGNOSIS — M5440 Lumbago with sciatica, unspecified side: Secondary | ICD-10-CM | POA: Diagnosis not present

## 2019-09-06 DIAGNOSIS — D638 Anemia in other chronic diseases classified elsewhere: Secondary | ICD-10-CM | POA: Diagnosis not present

## 2019-09-06 DIAGNOSIS — R2689 Other abnormalities of gait and mobility: Secondary | ICD-10-CM | POA: Diagnosis not present

## 2019-09-07 DIAGNOSIS — M545 Low back pain: Secondary | ICD-10-CM | POA: Diagnosis not present

## 2019-09-07 DIAGNOSIS — D638 Anemia in other chronic diseases classified elsewhere: Secondary | ICD-10-CM | POA: Diagnosis not present

## 2019-09-07 DIAGNOSIS — M6281 Muscle weakness (generalized): Secondary | ICD-10-CM | POA: Diagnosis not present

## 2019-09-07 DIAGNOSIS — R278 Other lack of coordination: Secondary | ICD-10-CM | POA: Diagnosis not present

## 2019-09-07 DIAGNOSIS — R2689 Other abnormalities of gait and mobility: Secondary | ICD-10-CM | POA: Diagnosis not present

## 2019-09-07 DIAGNOSIS — M5440 Lumbago with sciatica, unspecified side: Secondary | ICD-10-CM | POA: Diagnosis not present

## 2019-09-07 DIAGNOSIS — R2681 Unsteadiness on feet: Secondary | ICD-10-CM | POA: Diagnosis not present

## 2019-09-08 DIAGNOSIS — R2689 Other abnormalities of gait and mobility: Secondary | ICD-10-CM | POA: Diagnosis not present

## 2019-09-08 DIAGNOSIS — R2681 Unsteadiness on feet: Secondary | ICD-10-CM | POA: Diagnosis not present

## 2019-09-08 DIAGNOSIS — M545 Low back pain: Secondary | ICD-10-CM | POA: Diagnosis not present

## 2019-09-08 DIAGNOSIS — D638 Anemia in other chronic diseases classified elsewhere: Secondary | ICD-10-CM | POA: Diagnosis not present

## 2019-09-08 DIAGNOSIS — M6281 Muscle weakness (generalized): Secondary | ICD-10-CM | POA: Diagnosis not present

## 2019-09-08 DIAGNOSIS — R278 Other lack of coordination: Secondary | ICD-10-CM | POA: Diagnosis not present

## 2019-09-08 DIAGNOSIS — M5440 Lumbago with sciatica, unspecified side: Secondary | ICD-10-CM | POA: Diagnosis not present

## 2019-09-10 DIAGNOSIS — R2681 Unsteadiness on feet: Secondary | ICD-10-CM | POA: Diagnosis not present

## 2019-09-10 DIAGNOSIS — R278 Other lack of coordination: Secondary | ICD-10-CM | POA: Diagnosis not present

## 2019-09-10 DIAGNOSIS — R2689 Other abnormalities of gait and mobility: Secondary | ICD-10-CM | POA: Diagnosis not present

## 2019-09-10 DIAGNOSIS — D638 Anemia in other chronic diseases classified elsewhere: Secondary | ICD-10-CM | POA: Diagnosis not present

## 2019-09-10 DIAGNOSIS — M545 Low back pain: Secondary | ICD-10-CM | POA: Diagnosis not present

## 2019-09-10 DIAGNOSIS — M6281 Muscle weakness (generalized): Secondary | ICD-10-CM | POA: Diagnosis not present

## 2019-09-10 DIAGNOSIS — M5440 Lumbago with sciatica, unspecified side: Secondary | ICD-10-CM | POA: Diagnosis not present

## 2019-09-13 DIAGNOSIS — M5440 Lumbago with sciatica, unspecified side: Secondary | ICD-10-CM | POA: Diagnosis not present

## 2019-09-13 DIAGNOSIS — R2689 Other abnormalities of gait and mobility: Secondary | ICD-10-CM | POA: Diagnosis not present

## 2019-09-13 DIAGNOSIS — M6281 Muscle weakness (generalized): Secondary | ICD-10-CM | POA: Diagnosis not present

## 2019-09-13 DIAGNOSIS — M545 Low back pain: Secondary | ICD-10-CM | POA: Diagnosis not present

## 2019-09-13 DIAGNOSIS — R2681 Unsteadiness on feet: Secondary | ICD-10-CM | POA: Diagnosis not present

## 2019-09-13 DIAGNOSIS — D638 Anemia in other chronic diseases classified elsewhere: Secondary | ICD-10-CM | POA: Diagnosis not present

## 2019-09-13 DIAGNOSIS — R278 Other lack of coordination: Secondary | ICD-10-CM | POA: Diagnosis not present

## 2019-09-14 DIAGNOSIS — M5440 Lumbago with sciatica, unspecified side: Secondary | ICD-10-CM | POA: Diagnosis not present

## 2019-09-14 DIAGNOSIS — M545 Low back pain: Secondary | ICD-10-CM | POA: Diagnosis not present

## 2019-09-14 DIAGNOSIS — R2681 Unsteadiness on feet: Secondary | ICD-10-CM | POA: Diagnosis not present

## 2019-09-14 DIAGNOSIS — D638 Anemia in other chronic diseases classified elsewhere: Secondary | ICD-10-CM | POA: Diagnosis not present

## 2019-09-14 DIAGNOSIS — R278 Other lack of coordination: Secondary | ICD-10-CM | POA: Diagnosis not present

## 2019-09-14 DIAGNOSIS — R2689 Other abnormalities of gait and mobility: Secondary | ICD-10-CM | POA: Diagnosis not present

## 2019-09-14 DIAGNOSIS — M6281 Muscle weakness (generalized): Secondary | ICD-10-CM | POA: Diagnosis not present

## 2019-09-22 DIAGNOSIS — R278 Other lack of coordination: Secondary | ICD-10-CM | POA: Diagnosis not present

## 2019-09-22 DIAGNOSIS — M545 Low back pain: Secondary | ICD-10-CM | POA: Diagnosis not present

## 2019-09-22 DIAGNOSIS — M6281 Muscle weakness (generalized): Secondary | ICD-10-CM | POA: Diagnosis not present

## 2019-09-22 DIAGNOSIS — D638 Anemia in other chronic diseases classified elsewhere: Secondary | ICD-10-CM | POA: Diagnosis not present

## 2019-09-22 DIAGNOSIS — R2689 Other abnormalities of gait and mobility: Secondary | ICD-10-CM | POA: Diagnosis not present

## 2019-09-22 DIAGNOSIS — M5440 Lumbago with sciatica, unspecified side: Secondary | ICD-10-CM | POA: Diagnosis not present

## 2019-09-22 DIAGNOSIS — R2681 Unsteadiness on feet: Secondary | ICD-10-CM | POA: Diagnosis not present

## 2019-09-23 DIAGNOSIS — R2689 Other abnormalities of gait and mobility: Secondary | ICD-10-CM | POA: Diagnosis not present

## 2019-09-23 DIAGNOSIS — M6281 Muscle weakness (generalized): Secondary | ICD-10-CM | POA: Diagnosis not present

## 2019-09-23 DIAGNOSIS — Z20828 Contact with and (suspected) exposure to other viral communicable diseases: Secondary | ICD-10-CM | POA: Diagnosis not present

## 2019-09-23 DIAGNOSIS — R278 Other lack of coordination: Secondary | ICD-10-CM | POA: Diagnosis not present

## 2019-09-23 DIAGNOSIS — M545 Low back pain: Secondary | ICD-10-CM | POA: Diagnosis not present

## 2019-09-23 DIAGNOSIS — M5440 Lumbago with sciatica, unspecified side: Secondary | ICD-10-CM | POA: Diagnosis not present

## 2019-09-23 DIAGNOSIS — D638 Anemia in other chronic diseases classified elsewhere: Secondary | ICD-10-CM | POA: Diagnosis not present

## 2019-09-23 DIAGNOSIS — R2681 Unsteadiness on feet: Secondary | ICD-10-CM | POA: Diagnosis not present

## 2019-09-27 DIAGNOSIS — M47816 Spondylosis without myelopathy or radiculopathy, lumbar region: Secondary | ICD-10-CM | POA: Diagnosis not present

## 2019-09-27 DIAGNOSIS — R2689 Other abnormalities of gait and mobility: Secondary | ICD-10-CM | POA: Diagnosis not present

## 2019-09-27 DIAGNOSIS — M6281 Muscle weakness (generalized): Secondary | ICD-10-CM | POA: Diagnosis not present

## 2019-09-27 DIAGNOSIS — R278 Other lack of coordination: Secondary | ICD-10-CM | POA: Diagnosis not present

## 2019-09-27 DIAGNOSIS — R2681 Unsteadiness on feet: Secondary | ICD-10-CM | POA: Diagnosis not present

## 2019-09-27 DIAGNOSIS — D638 Anemia in other chronic diseases classified elsewhere: Secondary | ICD-10-CM | POA: Diagnosis not present

## 2019-09-27 DIAGNOSIS — M5416 Radiculopathy, lumbar region: Secondary | ICD-10-CM | POA: Diagnosis not present

## 2019-09-27 DIAGNOSIS — G2581 Restless legs syndrome: Secondary | ICD-10-CM | POA: Diagnosis not present

## 2019-09-27 DIAGNOSIS — Z79899 Other long term (current) drug therapy: Secondary | ICD-10-CM | POA: Diagnosis not present

## 2019-09-27 DIAGNOSIS — Z20828 Contact with and (suspected) exposure to other viral communicable diseases: Secondary | ICD-10-CM | POA: Diagnosis not present

## 2019-09-27 DIAGNOSIS — E039 Hypothyroidism, unspecified: Secondary | ICD-10-CM | POA: Diagnosis not present

## 2019-09-27 DIAGNOSIS — M545 Low back pain: Secondary | ICD-10-CM | POA: Diagnosis not present

## 2019-09-27 DIAGNOSIS — I482 Chronic atrial fibrillation, unspecified: Secondary | ICD-10-CM | POA: Diagnosis not present

## 2019-09-27 DIAGNOSIS — G301 Alzheimer's disease with late onset: Secondary | ICD-10-CM | POA: Diagnosis not present

## 2019-09-27 DIAGNOSIS — M5440 Lumbago with sciatica, unspecified side: Secondary | ICD-10-CM | POA: Diagnosis not present

## 2019-09-29 DIAGNOSIS — M5440 Lumbago with sciatica, unspecified side: Secondary | ICD-10-CM | POA: Diagnosis not present

## 2019-09-29 DIAGNOSIS — D638 Anemia in other chronic diseases classified elsewhere: Secondary | ICD-10-CM | POA: Diagnosis not present

## 2019-09-29 DIAGNOSIS — M6281 Muscle weakness (generalized): Secondary | ICD-10-CM | POA: Diagnosis not present

## 2019-09-29 DIAGNOSIS — R2689 Other abnormalities of gait and mobility: Secondary | ICD-10-CM | POA: Diagnosis not present

## 2019-09-29 DIAGNOSIS — M545 Low back pain: Secondary | ICD-10-CM | POA: Diagnosis not present

## 2019-09-29 DIAGNOSIS — R278 Other lack of coordination: Secondary | ICD-10-CM | POA: Diagnosis not present

## 2019-09-29 DIAGNOSIS — R2681 Unsteadiness on feet: Secondary | ICD-10-CM | POA: Diagnosis not present

## 2019-09-30 DIAGNOSIS — R2689 Other abnormalities of gait and mobility: Secondary | ICD-10-CM | POA: Diagnosis not present

## 2019-09-30 DIAGNOSIS — R2681 Unsteadiness on feet: Secondary | ICD-10-CM | POA: Diagnosis not present

## 2019-09-30 DIAGNOSIS — M545 Low back pain: Secondary | ICD-10-CM | POA: Diagnosis not present

## 2019-09-30 DIAGNOSIS — M5440 Lumbago with sciatica, unspecified side: Secondary | ICD-10-CM | POA: Diagnosis not present

## 2019-09-30 DIAGNOSIS — Z20828 Contact with and (suspected) exposure to other viral communicable diseases: Secondary | ICD-10-CM | POA: Diagnosis not present

## 2019-09-30 DIAGNOSIS — M6281 Muscle weakness (generalized): Secondary | ICD-10-CM | POA: Diagnosis not present

## 2019-09-30 DIAGNOSIS — D638 Anemia in other chronic diseases classified elsewhere: Secondary | ICD-10-CM | POA: Diagnosis not present

## 2019-09-30 DIAGNOSIS — R278 Other lack of coordination: Secondary | ICD-10-CM | POA: Diagnosis not present

## 2019-10-01 DIAGNOSIS — R2681 Unsteadiness on feet: Secondary | ICD-10-CM | POA: Diagnosis not present

## 2019-10-01 DIAGNOSIS — D638 Anemia in other chronic diseases classified elsewhere: Secondary | ICD-10-CM | POA: Diagnosis not present

## 2019-10-01 DIAGNOSIS — R278 Other lack of coordination: Secondary | ICD-10-CM | POA: Diagnosis not present

## 2019-10-01 DIAGNOSIS — M5440 Lumbago with sciatica, unspecified side: Secondary | ICD-10-CM | POA: Diagnosis not present

## 2019-10-01 DIAGNOSIS — M545 Low back pain: Secondary | ICD-10-CM | POA: Diagnosis not present

## 2019-10-01 DIAGNOSIS — M6281 Muscle weakness (generalized): Secondary | ICD-10-CM | POA: Diagnosis not present

## 2019-10-01 DIAGNOSIS — R2689 Other abnormalities of gait and mobility: Secondary | ICD-10-CM | POA: Diagnosis not present

## 2019-10-04 DIAGNOSIS — D638 Anemia in other chronic diseases classified elsewhere: Secondary | ICD-10-CM | POA: Diagnosis not present

## 2019-10-04 DIAGNOSIS — R2689 Other abnormalities of gait and mobility: Secondary | ICD-10-CM | POA: Diagnosis not present

## 2019-10-04 DIAGNOSIS — R278 Other lack of coordination: Secondary | ICD-10-CM | POA: Diagnosis not present

## 2019-10-04 DIAGNOSIS — R2681 Unsteadiness on feet: Secondary | ICD-10-CM | POA: Diagnosis not present

## 2019-10-04 DIAGNOSIS — M6281 Muscle weakness (generalized): Secondary | ICD-10-CM | POA: Diagnosis not present

## 2019-10-04 DIAGNOSIS — M545 Low back pain: Secondary | ICD-10-CM | POA: Diagnosis not present

## 2019-10-04 DIAGNOSIS — M5440 Lumbago with sciatica, unspecified side: Secondary | ICD-10-CM | POA: Diagnosis not present

## 2019-10-06 DIAGNOSIS — D638 Anemia in other chronic diseases classified elsewhere: Secondary | ICD-10-CM | POA: Diagnosis not present

## 2019-10-06 DIAGNOSIS — M6281 Muscle weakness (generalized): Secondary | ICD-10-CM | POA: Diagnosis not present

## 2019-10-06 DIAGNOSIS — R2689 Other abnormalities of gait and mobility: Secondary | ICD-10-CM | POA: Diagnosis not present

## 2019-10-06 DIAGNOSIS — R2681 Unsteadiness on feet: Secondary | ICD-10-CM | POA: Diagnosis not present

## 2019-10-06 DIAGNOSIS — M5440 Lumbago with sciatica, unspecified side: Secondary | ICD-10-CM | POA: Diagnosis not present

## 2019-10-06 DIAGNOSIS — R278 Other lack of coordination: Secondary | ICD-10-CM | POA: Diagnosis not present

## 2019-10-06 DIAGNOSIS — M545 Low back pain: Secondary | ICD-10-CM | POA: Diagnosis not present

## 2019-10-07 DIAGNOSIS — Z79899 Other long term (current) drug therapy: Secondary | ICD-10-CM | POA: Diagnosis not present

## 2019-10-07 DIAGNOSIS — Z20828 Contact with and (suspected) exposure to other viral communicable diseases: Secondary | ICD-10-CM | POA: Diagnosis not present

## 2019-10-11 ENCOUNTER — Emergency Department
Admission: EM | Admit: 2019-10-11 | Discharge: 2019-10-11 | Disposition: A | Discharge status: Skilled Nursing Facility | Payer: Medicare HMO | Source: Home / Self Care | Attending: Emergency Medicine | Admitting: Emergency Medicine

## 2019-10-11 ENCOUNTER — Other Ambulatory Visit: Payer: Self-pay

## 2019-10-11 ENCOUNTER — Emergency Department: Payer: Medicare HMO

## 2019-10-11 ENCOUNTER — Encounter: Payer: Self-pay | Admitting: Emergency Medicine

## 2019-10-11 DIAGNOSIS — Z7901 Long term (current) use of anticoagulants: Secondary | ICD-10-CM | POA: Diagnosis not present

## 2019-10-11 DIAGNOSIS — J189 Pneumonia, unspecified organism: Secondary | ICD-10-CM | POA: Diagnosis not present

## 2019-10-11 DIAGNOSIS — F0281 Dementia in other diseases classified elsewhere with behavioral disturbance: Secondary | ICD-10-CM | POA: Insufficient documentation

## 2019-10-11 DIAGNOSIS — S0990XA Unspecified injury of head, initial encounter: Secondary | ICD-10-CM | POA: Diagnosis not present

## 2019-10-11 DIAGNOSIS — R410 Disorientation, unspecified: Secondary | ICD-10-CM | POA: Diagnosis not present

## 2019-10-11 DIAGNOSIS — W19XXXA Unspecified fall, initial encounter: Secondary | ICD-10-CM

## 2019-10-11 DIAGNOSIS — N183 Chronic kidney disease, stage 3 unspecified: Secondary | ICD-10-CM | POA: Diagnosis not present

## 2019-10-11 DIAGNOSIS — Y92128 Other place in nursing home as the place of occurrence of the external cause: Secondary | ICD-10-CM | POA: Insufficient documentation

## 2019-10-11 DIAGNOSIS — F0390 Unspecified dementia without behavioral disturbance: Secondary | ICD-10-CM

## 2019-10-11 DIAGNOSIS — Z853 Personal history of malignant neoplasm of breast: Secondary | ICD-10-CM | POA: Diagnosis not present

## 2019-10-11 DIAGNOSIS — G301 Alzheimer's disease with late onset: Secondary | ICD-10-CM | POA: Insufficient documentation

## 2019-10-11 DIAGNOSIS — N179 Acute kidney failure, unspecified: Secondary | ICD-10-CM | POA: Diagnosis not present

## 2019-10-11 DIAGNOSIS — N1831 Chronic kidney disease, stage 3a: Secondary | ICD-10-CM | POA: Diagnosis not present

## 2019-10-11 DIAGNOSIS — Z950 Presence of cardiac pacemaker: Secondary | ICD-10-CM | POA: Diagnosis not present

## 2019-10-11 DIAGNOSIS — H9190 Unspecified hearing loss, unspecified ear: Secondary | ICD-10-CM | POA: Diagnosis present

## 2019-10-11 DIAGNOSIS — J690 Pneumonitis due to inhalation of food and vomit: Secondary | ICD-10-CM | POA: Diagnosis not present

## 2019-10-11 DIAGNOSIS — Z923 Personal history of irradiation: Secondary | ICD-10-CM | POA: Diagnosis not present

## 2019-10-11 DIAGNOSIS — Z66 Do not resuscitate: Secondary | ICD-10-CM | POA: Diagnosis not present

## 2019-10-11 DIAGNOSIS — Y999 Unspecified external cause status: Secondary | ICD-10-CM | POA: Insufficient documentation

## 2019-10-11 DIAGNOSIS — R0682 Tachypnea, not elsewhere classified: Secondary | ICD-10-CM | POA: Diagnosis not present

## 2019-10-11 DIAGNOSIS — S3993XA Unspecified injury of pelvis, initial encounter: Secondary | ICD-10-CM | POA: Diagnosis not present

## 2019-10-11 DIAGNOSIS — E860 Dehydration: Secondary | ICD-10-CM | POA: Diagnosis not present

## 2019-10-11 DIAGNOSIS — G9341 Metabolic encephalopathy: Secondary | ICD-10-CM | POA: Diagnosis not present

## 2019-10-11 DIAGNOSIS — I129 Hypertensive chronic kidney disease with stage 1 through stage 4 chronic kidney disease, or unspecified chronic kidney disease: Secondary | ICD-10-CM | POA: Diagnosis present

## 2019-10-11 DIAGNOSIS — R001 Bradycardia, unspecified: Secondary | ICD-10-CM | POA: Diagnosis not present

## 2019-10-11 DIAGNOSIS — R404 Transient alteration of awareness: Secondary | ICD-10-CM | POA: Diagnosis not present

## 2019-10-11 DIAGNOSIS — Z7401 Bed confinement status: Secondary | ICD-10-CM | POA: Diagnosis not present

## 2019-10-11 DIAGNOSIS — M1990 Unspecified osteoarthritis, unspecified site: Secondary | ICD-10-CM | POA: Diagnosis present

## 2019-10-11 DIAGNOSIS — F329 Major depressive disorder, single episode, unspecified: Secondary | ICD-10-CM | POA: Diagnosis not present

## 2019-10-11 DIAGNOSIS — E039 Hypothyroidism, unspecified: Secondary | ICD-10-CM | POA: Diagnosis not present

## 2019-10-11 DIAGNOSIS — R269 Unspecified abnormalities of gait and mobility: Secondary | ICD-10-CM | POA: Diagnosis not present

## 2019-10-11 DIAGNOSIS — D649 Anemia, unspecified: Secondary | ICD-10-CM | POA: Diagnosis not present

## 2019-10-11 DIAGNOSIS — R4182 Altered mental status, unspecified: Secondary | ICD-10-CM | POA: Diagnosis not present

## 2019-10-11 DIAGNOSIS — R918 Other nonspecific abnormal finding of lung field: Secondary | ICD-10-CM | POA: Diagnosis not present

## 2019-10-11 DIAGNOSIS — Z20822 Contact with and (suspected) exposure to covid-19: Secondary | ICD-10-CM | POA: Diagnosis not present

## 2019-10-11 DIAGNOSIS — I482 Chronic atrial fibrillation, unspecified: Secondary | ICD-10-CM | POA: Diagnosis not present

## 2019-10-11 DIAGNOSIS — Z79899 Other long term (current) drug therapy: Secondary | ICD-10-CM | POA: Diagnosis not present

## 2019-10-11 DIAGNOSIS — K219 Gastro-esophageal reflux disease without esophagitis: Secondary | ICD-10-CM | POA: Diagnosis present

## 2019-10-11 DIAGNOSIS — F419 Anxiety disorder, unspecified: Secondary | ICD-10-CM | POA: Diagnosis not present

## 2019-10-11 DIAGNOSIS — R0689 Other abnormalities of breathing: Secondary | ICD-10-CM | POA: Diagnosis not present

## 2019-10-11 DIAGNOSIS — Z79890 Hormone replacement therapy (postmenopausal): Secondary | ICD-10-CM | POA: Diagnosis not present

## 2019-10-11 DIAGNOSIS — I10 Essential (primary) hypertension: Secondary | ICD-10-CM | POA: Insufficient documentation

## 2019-10-11 DIAGNOSIS — M2550 Pain in unspecified joint: Secondary | ICD-10-CM | POA: Diagnosis not present

## 2019-10-11 DIAGNOSIS — M47816 Spondylosis without myelopathy or radiculopathy, lumbar region: Secondary | ICD-10-CM | POA: Diagnosis not present

## 2019-10-11 DIAGNOSIS — Y9389 Activity, other specified: Secondary | ICD-10-CM | POA: Insufficient documentation

## 2019-10-11 DIAGNOSIS — I442 Atrioventricular block, complete: Secondary | ICD-10-CM | POA: Diagnosis not present

## 2019-10-11 LAB — CBC WITH DIFFERENTIAL/PLATELET
Abs Immature Granulocytes: 0.03 10*3/uL (ref 0.00–0.07)
Basophils Absolute: 0 10*3/uL (ref 0.0–0.1)
Basophils Relative: 0 %
Eosinophils Absolute: 0.1 10*3/uL (ref 0.0–0.5)
Eosinophils Relative: 2 %
HCT: 34.4 % — ABNORMAL LOW (ref 36.0–46.0)
Hemoglobin: 10.9 g/dL — ABNORMAL LOW (ref 12.0–15.0)
Immature Granulocytes: 1 %
Lymphocytes Relative: 31 %
Lymphs Abs: 1.9 10*3/uL (ref 0.7–4.0)
MCH: 30.6 pg (ref 26.0–34.0)
MCHC: 31.7 g/dL (ref 30.0–36.0)
MCV: 96.6 fL (ref 80.0–100.0)
Monocytes Absolute: 0.5 10*3/uL (ref 0.1–1.0)
Monocytes Relative: 9 %
Neutro Abs: 3.5 10*3/uL (ref 1.7–7.7)
Neutrophils Relative %: 57 %
Platelets: 204 10*3/uL (ref 150–400)
RBC: 3.56 MIL/uL — ABNORMAL LOW (ref 3.87–5.11)
RDW: 14.4 % (ref 11.5–15.5)
WBC: 6.2 10*3/uL (ref 4.0–10.5)
nRBC: 0 % (ref 0.0–0.2)

## 2019-10-11 LAB — COMPREHENSIVE METABOLIC PANEL
ALT: 16 U/L (ref 0–44)
AST: 23 U/L (ref 15–41)
Albumin: 3.6 g/dL (ref 3.5–5.0)
Alkaline Phosphatase: 59 U/L (ref 38–126)
Anion gap: 10 (ref 5–15)
BUN: 21 mg/dL (ref 8–23)
CO2: 24 mmol/L (ref 22–32)
Calcium: 8.9 mg/dL (ref 8.9–10.3)
Chloride: 102 mmol/L (ref 98–111)
Creatinine, Ser: 1.15 mg/dL — ABNORMAL HIGH (ref 0.44–1.00)
GFR calc Af Amer: 51 mL/min — ABNORMAL LOW (ref >60)
GFR calc non Af Amer: 44 mL/min — ABNORMAL LOW (ref >60)
Glucose, Bld: 138 mg/dL — ABNORMAL HIGH (ref 70–99)
Potassium: 4.1 mmol/L (ref 3.5–5.1)
Sodium: 136 mmol/L (ref 135–145)
Total Bilirubin: 0.8 mg/dL (ref 0.3–1.2)
Total Protein: 6.7 g/dL (ref 6.5–8.1)

## 2019-10-11 LAB — URINALYSIS, COMPLETE (UACMP) WITH MICROSCOPIC
Bacteria, UA: NONE SEEN
Bilirubin Urine: NEGATIVE
Glucose, UA: NEGATIVE mg/dL
Ketones, ur: NEGATIVE mg/dL
Leukocytes,Ua: NEGATIVE
Nitrite: NEGATIVE
Protein, ur: NEGATIVE mg/dL
Specific Gravity, Urine: 1.016 (ref 1.005–1.030)
pH: 7 (ref 5.0–8.0)

## 2019-10-11 LAB — GLUCOSE, CAPILLARY: Glucose-Capillary: 117 mg/dL — ABNORMAL HIGH (ref 70–99)

## 2019-10-11 MED ORDER — LORAZEPAM 2 MG/ML IJ SOLN
0.5000 mg | Freq: Once | INTRAMUSCULAR | Status: AC
  Administered 2019-10-11: 0.5 mg via INTRAVENOUS
  Filled 2019-10-11: qty 1

## 2019-10-11 NOTE — ED Provider Notes (Addendum)
[facility] Emergency Department Provider Note       Time seen: ----------------------------------------- 5:11 PM on 10/11/2019 -----------------------------------------   I have reviewed the triage vital signs and the nursing notes. Level V caveat: History/ROS limited by altered mental status HISTORY   Chief Complaint Fall   HPI Sally Carroll is a 86 y.o. female with a history of anemia, anxiety, arthritis, breast cancer, depression, hypertension who presents to the ED for unwitnessed fall.  Patient is confused at baseline, reportedly is more confused than normal.  Patient cannot give further review of systems or report.  Past Medical History:  Diagnosis Date  . Anemia   . Anxiety   . Arthritis   . Breast cancer ([place]) 2011   rt- radiation  . Cancer [facility]) 2011   rt breast  . Depression   . Dysrhythmia    CHB s/p PPM, afib history  . GERD (gastroesophageal reflux disease)   . Heart murmur   . Hypertension   . Personal history of radiation therapy   . Presence of permanent cardiac pacemaker     Patient Active Problem List   Diagnosis Date Noted  . Intermediate stage dry age-related macular degeneration of both eyes 07/01/2017  . Pterygium eye, left 04/02/2017  . Late onset Alzheimer's disease with behavioral disturbance ([place]) 11/01/2016  . Rotator cuff tendinitis, left 07/29/2016  . Lumbar spondylosis 05/27/2016  . Pneumonia 11/09/2015  . Herniated lumbar intervertebral disc 02/10/2015  . 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 Lumbar radiculitis 11/11/2014  . Primary osteoarthritis of right hip 10/24/2014  . Absolute anemia 01/14/2014  . Long term (current) use of anticoagulants 12/31/2013  . NSVT (nonsustained ventricular tachycardia) ([place]) 09/04/2011  . Anxiety and depression 08/29/2011  . A-fib ([place]) 08/29/2011  . Breast CA ([place]) 08/29/2011  . Artificial cardiac pacemaker  08/29/2011  . Acquired complete AV block ([place]) 08/29/2011  . Hyperlipidemia, unspecified 08/29/2011  . Hypertension 08/29/2011  . Restless leg syndrome 08/29/2011    Past Surgical History:  Procedure Laterality Date  . BREAST BIOPSY Right 2011   right lumpectomy rad  . BREAST SURGERY Right    bx  . EYE SURGERY Bilateral    cataracts  . LUMBAR LAMINECTOMY/DECOMPRESSION MICRODISCECTOMY Right 02/10/2015   Procedure: Right Lumbar Four-Five Microdiskectomy;  Surgeon: Erline Levine, MD;  Location: [facility];  Service: Neurosurgery;  Laterality: Right;  Right L4-5 Microdiskectomy  . PACEMAKER INSERTION     99.07    Allergies Naproxen, Sucralfate, and Prednisone  Social History Social History   Tobacco Use  . Smoking status: Never Smoker  . Smokeless tobacco: Never Used  Substance Use Topics  . Alcohol use: No  . Drug use: No    Review of Systems Unknown, reported fall  All systems negative/normal/unremarkable except as stated in the HPI  ____________________________________________   PHYSICAL EXAM:  VITAL SIGNS: ED Triage Vitals  Enc Vitals Group     BP      Pulse      Resp      Temp      Temp src      SpO2      Weight      Height      Head Circumference      Peak Flow      Pain Score      Pain Loc      Pain Edu?      Excl. in [place]?  Constitutional: Alert but disoriented, well appearing and in no distress.  Patient is very hard of hearing Eyes: Conjunctivae are normal. Normal extraocular movements. ENT      Head: Normocephalic and atraumatic.      Nose: No congestion/rhinnorhea.      Mouth/Throat: Mucous membranes are moist.      Neck: No stridor. Cardiovascular: Normal rate, regular rhythm. No murmurs, rubs, or gallops. Respiratory: Normal respiratory effort without tachypnea nor retractions. Breath sounds are clear and equal bilaterally. No wheezes/rales/rhonchi. Gastrointestinal: Soft and nontender. Normal bowel sounds Musculoskeletal: Nontender  with normal range of motion in extremities. No lower extremity tenderness nor edema. Neurologic:  Normal speech and language. No gross focal neurologic deficits are appreciated.  Skin:  Skin is warm, dry and intact. No rash noted. Psychiatric: Mood and affect are normal.  ____________________________________________  EKG: Interpreted by me.  AV dual paced rhythm with a rate of 83 bpm, normal pacemaker function is noted  ____________________________________________  ED COURSE:  As part of my medical decision making, I reviewed the following data within the [place] History obtained from family if available, nursing notes, old chart and ekg, as well as notes from prior ED visits. Patient presented for a fall, we will assess with labs and imaging as indicated at this time.   Procedures  [name] was evaluated in Emergency Department on 10/11/2019 for the symptoms described in the history of present illness. She was evaluated in the context of the global COVID-19 pandemic, which necessitated consideration that the patient might be at risk for infection with the SARS-CoV-2 virus that causes COVID-19. Institutional protocols and algorithms that pertain to the evaluation of patients at risk for COVID-19 are in a state of rapid change based on information released by regulatory bodies including the CDC and federal and state organizations. These policies and algorithms were followed during the patient's care in the ED.  ____________________________________________   LABS (pertinent positives/negatives)  Labs Reviewed  CBC WITH DIFFERENTIAL/PLATELET - Abnormal; Notable for the following components:      Result Value   RBC 3.56 (*)    Hemoglobin 10.9 (*)    HCT 34.4 (*)    All other components within normal limits  COMPREHENSIVE METABOLIC PANEL - Abnormal; Notable for the following components:   Glucose, Bld 138 (*)    Creatinine, Ser 1.15 (*)    GFR calc non Af Amer 44  (*)    GFR calc Af Amer 51 (*)    All other components within normal limits  GLUCOSE, CAPILLARY - Abnormal; Notable for the following components:   Glucose-Capillary 117 (*)    All other components within normal limits  URINALYSIS, COMPLETE (UACMP) WITH MICROSCOPIC    RADIOLOGY Images were viewed by me  CT head, pelvis x-ray IMPRESSION:  Atrophy, chronic microvascular disease.   No acute intracranial abnormality.  IMPRESSION:  Negative.  ____________________________________________   DIFFERENTIAL DIAGNOSIS   Fall, fracture, subdural, Alzheimer's, occult infection, dehydration, electrolyte abnormality  FINAL ASSESSMENT AND PLAN  Fall, dementia   Plan: The patient had presented for an unwitnessed fall. Patient's labs are unremarkable. Patient's imaging not reveal any acute process.  She appears medically cleared for outpatient follow-up.   Laurence Aly, MD    Note: This note was generated in part or whole with voice recognition software. Voice recognition is usually quite accurate but there are transcription errors that can and very often do occur. I apologize for any typographical errors that were not  detected and corrected.     Earleen Newport, MD 10/11/19 1719    Earleen Newport, MD 10/11/19 [phone]

## 2019-10-11 NOTE — ED Notes (Signed)
Unable to obtain e-signature at d/c d/t pt h/x of confusion at baseline.

## 2019-10-11 NOTE — ED Notes (Signed)
Attempted to call Pt's niece to inform her that pt was set to be DC.

## 2019-10-11 NOTE — ED Triage Notes (Addendum)
Pt arrives from [place] following an unwitnessed fall. Pt is confused at baseline.

## 2019-10-12 IMAGING — CT CT HEAD WO/W CM
3 of 4 series · 16 of 47 positions shown, 19 images · IV contrast (APPLIED)
Comparison: 07/24/2018

CLINICAL DATA: Recent fall on anticoagulants. Alzheimer's disease.
History of breast cancer.

EXAM:
CT HEAD WITHOUT AND WITH CONTRAST
TECHNIQUE: Contiguous axial images were obtained from the base of the skull
through the vertex without and with intravenous contrast
CONTRAST:  75mL OMNIPAQUE IOHEXOL 300 MG/ML  SOLN

[Series 2: head wo · axial · 0.41mm/px · z∈[-109,+11]mm · 10 of 30 slices shown, 13 images]
[im 3/30  brain]
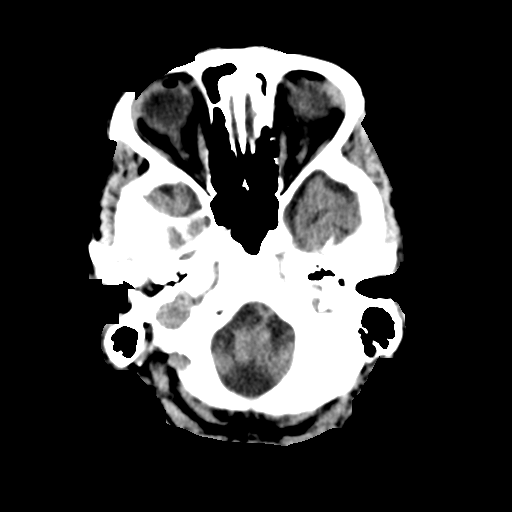
[im 3/30  bone]
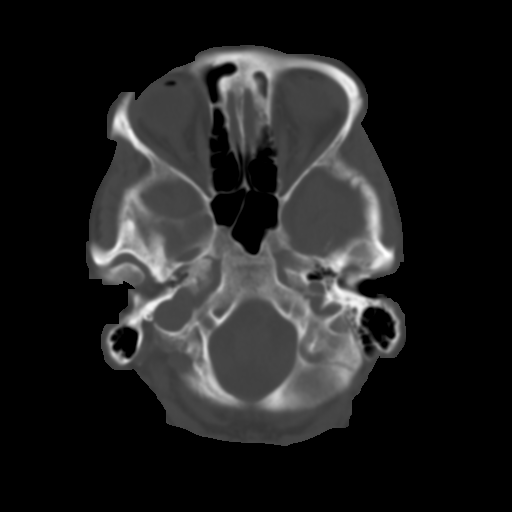
[im 5/30  brain]
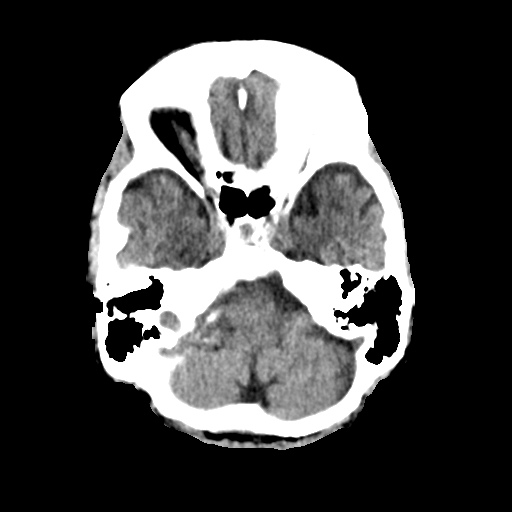
[im 9/30  brain]
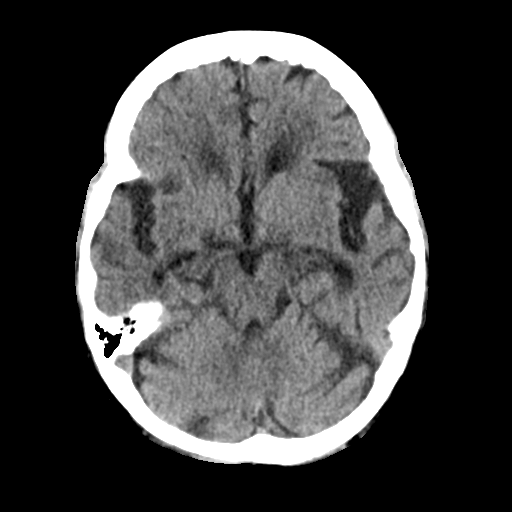
[im 11/30  brain]
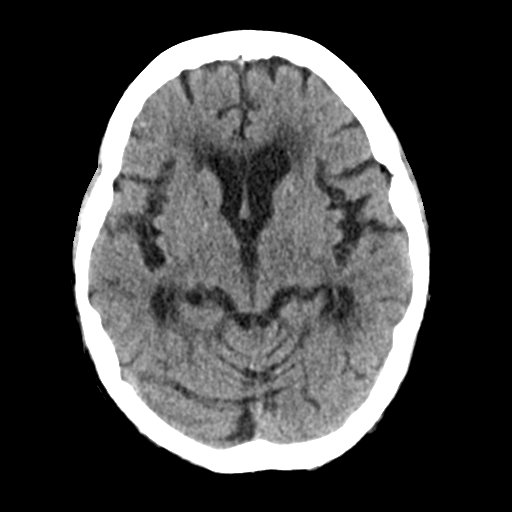
[im 13/30  brain]
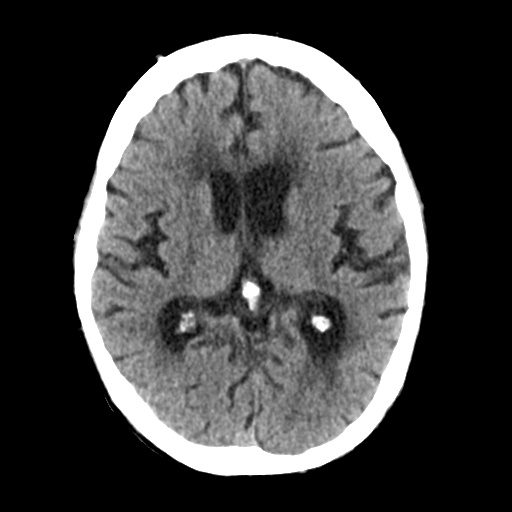
[im 13/30  bone]
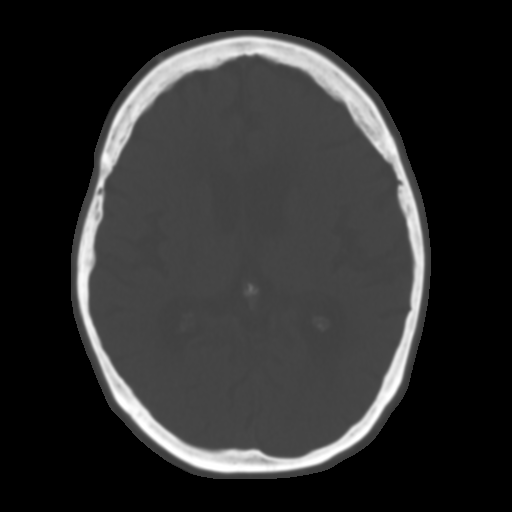
[im 17/30  brain]
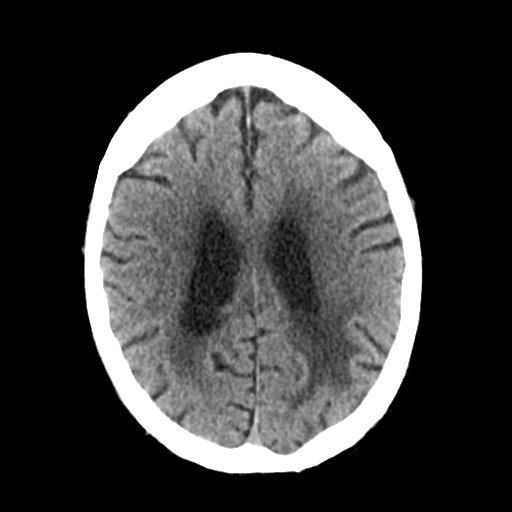
[im 19/30  brain]
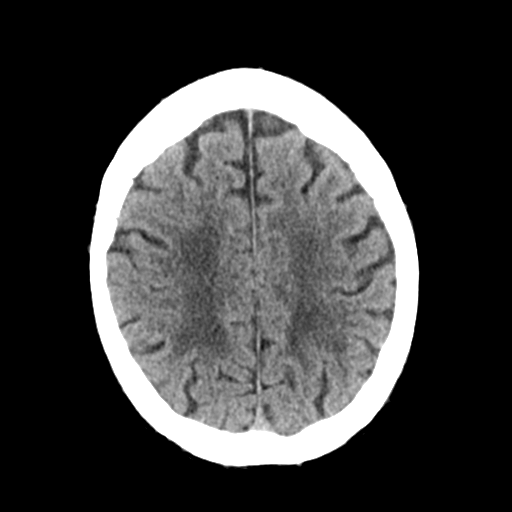
[im 21/30  brain]
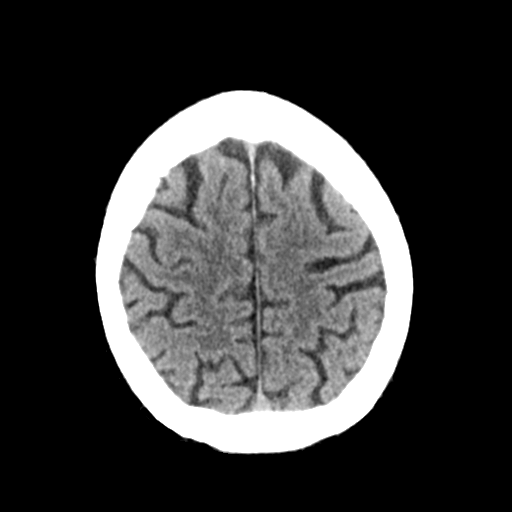
[im 25/30  brain]
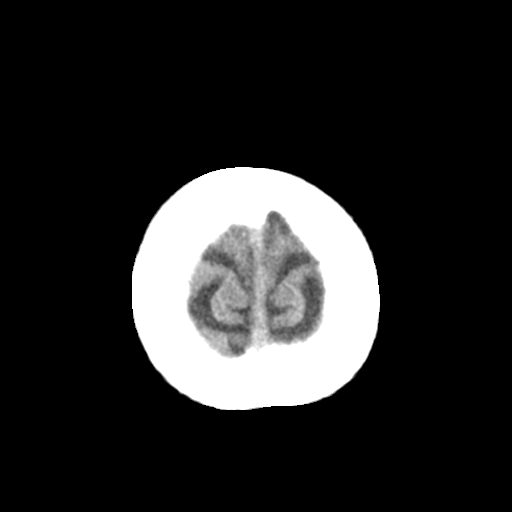
[im 25/30  bone]
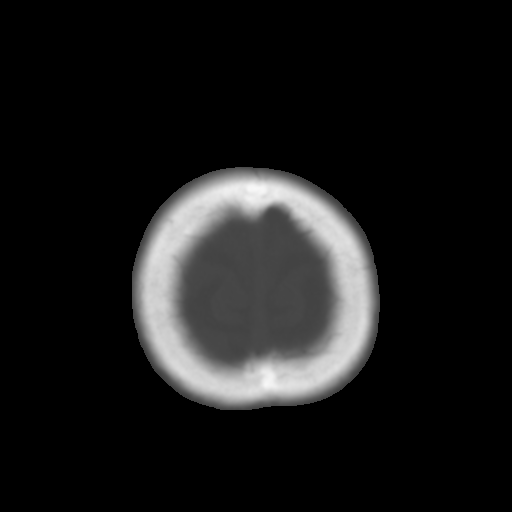
[im 27/30  brain]
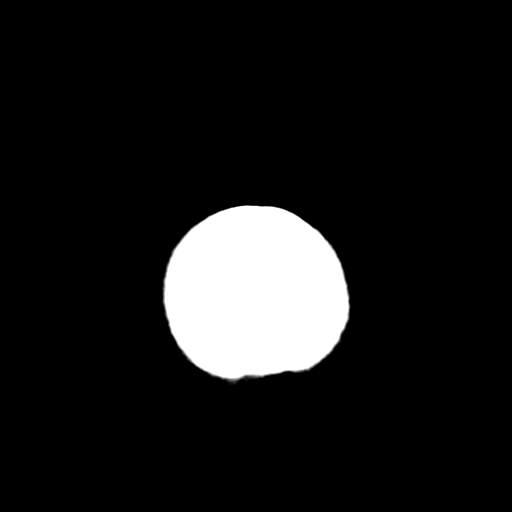

[Series 5: coronal soft tissue · coronal · 0.30mm/px · 3 of 60 slices shown]
[im 20/60  brain]
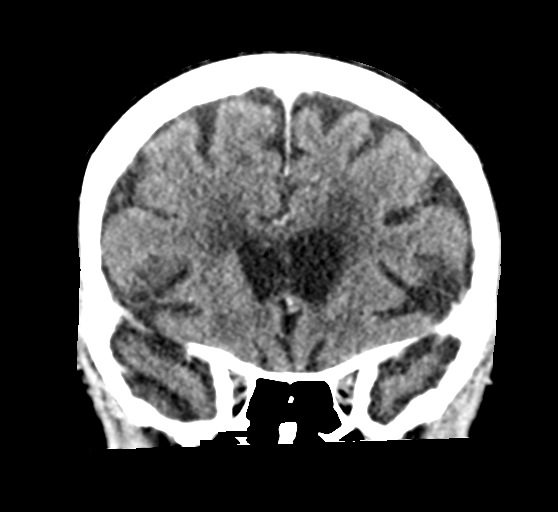
[im 27/60  brain]
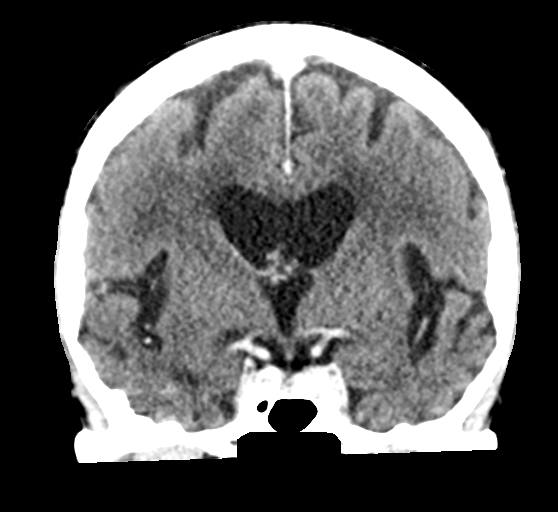
[im 33/60  brain]
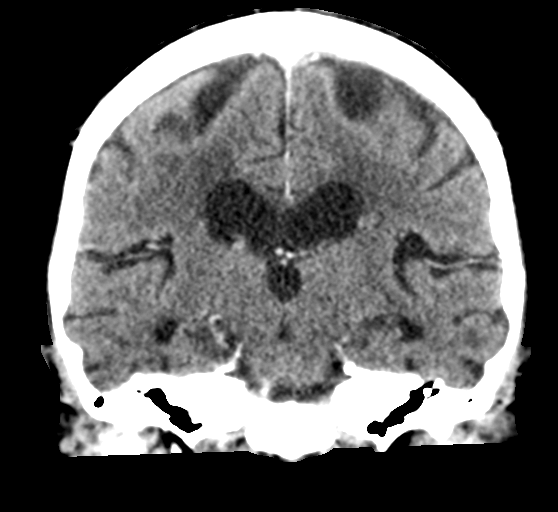

[Series 6: sagittal soft tissue · sagittal · 0.31mm/px · 3 of 52 slices shown]
[im 18/52  brain]
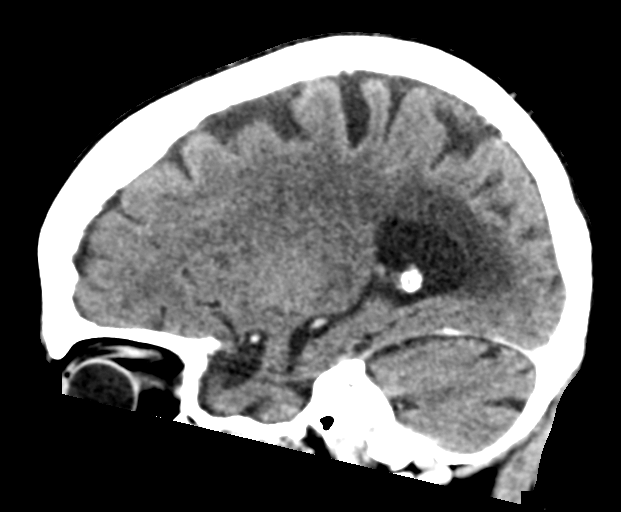
[im 26/52  brain]
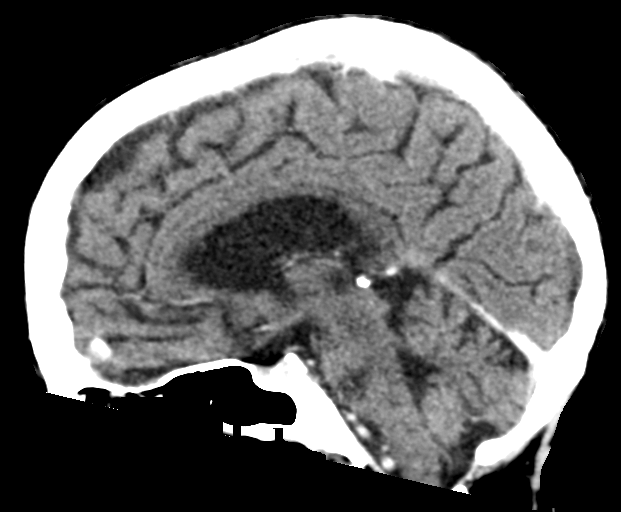
[im 35/52  brain]
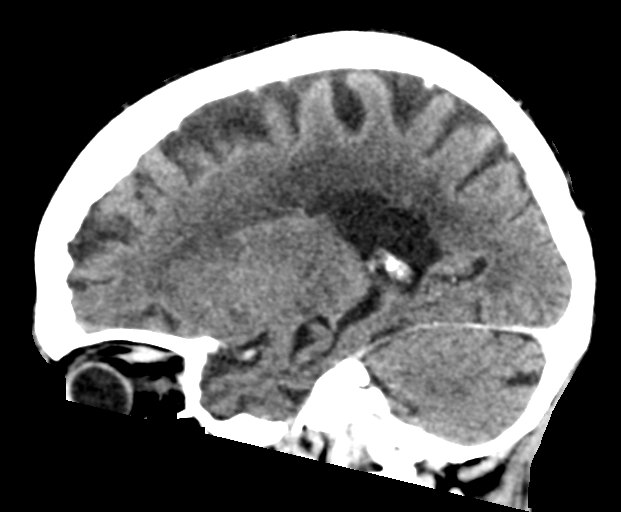

[16 of 47 positions shown; findings below may reference images not displayed]

FINDINGS: Brain: No evidence of acute infarction, hemorrhage, hydrocephalus,
extra-axial collection or mass lesion/mass effect. Moderate to
extensive low-density in the cerebral white matter consistent with
chronic small vessel ischemia. Mild-to-moderate cerebral volume loss
for age. There is a notable temporal component in this patient with
history of Alzheimer's disease. No abnormal intracranial enhancement

Vascular: Visible vessels are patent.

Skull: No acute or aggressive finding

Sinuses/Orbits: Negative
IMPRESSION: No acute or reversible finding. No evidence of intracranial injury
or metastatic disease.

## 2019-10-13 ENCOUNTER — Other Ambulatory Visit: Payer: Self-pay

## 2019-10-13 ENCOUNTER — Emergency Department: Payer: Medicare HMO

## 2019-10-13 ENCOUNTER — Inpatient Hospital Stay
Admission: EM | Admit: 2019-10-13 | Discharge: 2019-10-15 | DRG: 177 | Disposition: A | Discharge status: Home / Self Care | Payer: Medicare HMO | Source: Skilled Nursing Facility | Attending: Internal Medicine | Admitting: Internal Medicine

## 2019-10-13 DIAGNOSIS — I482 Chronic atrial fibrillation, unspecified: Secondary | ICD-10-CM | POA: Diagnosis present

## 2019-10-13 DIAGNOSIS — N1831 Chronic kidney disease, stage 3a: Secondary | ICD-10-CM

## 2019-10-13 DIAGNOSIS — J690 Pneumonitis due to inhalation of food and vomit: Principal | ICD-10-CM | POA: Diagnosis present

## 2019-10-13 DIAGNOSIS — E039 Hypothyroidism, unspecified: Secondary | ICD-10-CM | POA: Diagnosis present

## 2019-10-13 DIAGNOSIS — F329 Major depressive disorder, single episode, unspecified: Secondary | ICD-10-CM | POA: Diagnosis present

## 2019-10-13 DIAGNOSIS — E860 Dehydration: Secondary | ICD-10-CM | POA: Diagnosis present

## 2019-10-13 DIAGNOSIS — I129 Hypertensive chronic kidney disease with stage 1 through stage 4 chronic kidney disease, or unspecified chronic kidney disease: Secondary | ICD-10-CM | POA: Diagnosis present

## 2019-10-13 DIAGNOSIS — N179 Acute kidney failure, unspecified: Secondary | ICD-10-CM | POA: Diagnosis present

## 2019-10-13 DIAGNOSIS — Z66 Do not resuscitate: Secondary | ICD-10-CM | POA: Diagnosis present

## 2019-10-13 DIAGNOSIS — Z20822 Contact with and (suspected) exposure to covid-19: Secondary | ICD-10-CM | POA: Diagnosis present

## 2019-10-13 DIAGNOSIS — K219 Gastro-esophageal reflux disease without esophagitis: Secondary | ICD-10-CM | POA: Diagnosis present

## 2019-10-13 DIAGNOSIS — F0390 Unspecified dementia without behavioral disturbance: Secondary | ICD-10-CM | POA: Diagnosis present

## 2019-10-13 DIAGNOSIS — J189 Pneumonia, unspecified organism: Secondary | ICD-10-CM | POA: Diagnosis present

## 2019-10-13 DIAGNOSIS — G301 Alzheimer's disease with late onset: Secondary | ICD-10-CM | POA: Diagnosis present

## 2019-10-13 DIAGNOSIS — F0281 Dementia in other diseases classified elsewhere with behavioral disturbance: Secondary | ICD-10-CM | POA: Diagnosis present

## 2019-10-13 DIAGNOSIS — Z950 Presence of cardiac pacemaker: Secondary | ICD-10-CM

## 2019-10-13 DIAGNOSIS — I442 Atrioventricular block, complete: Secondary | ICD-10-CM | POA: Diagnosis present

## 2019-10-13 DIAGNOSIS — I10 Essential (primary) hypertension: Secondary | ICD-10-CM | POA: Diagnosis present

## 2019-10-13 DIAGNOSIS — Z79899 Other long term (current) drug therapy: Secondary | ICD-10-CM | POA: Diagnosis not present

## 2019-10-13 DIAGNOSIS — Z79890 Hormone replacement therapy (postmenopausal): Secondary | ICD-10-CM | POA: Diagnosis not present

## 2019-10-13 DIAGNOSIS — G9341 Metabolic encephalopathy: Secondary | ICD-10-CM | POA: Diagnosis present

## 2019-10-13 DIAGNOSIS — Z7901 Long term (current) use of anticoagulants: Secondary | ICD-10-CM

## 2019-10-13 DIAGNOSIS — R4182 Altered mental status, unspecified: Secondary | ICD-10-CM

## 2019-10-13 DIAGNOSIS — F419 Anxiety disorder, unspecified: Secondary | ICD-10-CM

## 2019-10-13 DIAGNOSIS — N183 Chronic kidney disease, stage 3 unspecified: Secondary | ICD-10-CM | POA: Diagnosis not present

## 2019-10-13 DIAGNOSIS — Z853 Personal history of malignant neoplasm of breast: Secondary | ICD-10-CM | POA: Diagnosis not present

## 2019-10-13 DIAGNOSIS — I4891 Unspecified atrial fibrillation: Secondary | ICD-10-CM | POA: Diagnosis present

## 2019-10-13 DIAGNOSIS — M1990 Unspecified osteoarthritis, unspecified site: Secondary | ICD-10-CM | POA: Diagnosis present

## 2019-10-13 DIAGNOSIS — H9190 Unspecified hearing loss, unspecified ear: Secondary | ICD-10-CM | POA: Diagnosis present

## 2019-10-13 DIAGNOSIS — Z923 Personal history of irradiation: Secondary | ICD-10-CM

## 2019-10-13 DIAGNOSIS — Z888 Allergy status to other drugs, medicaments and biological substances status: Secondary | ICD-10-CM

## 2019-10-13 LAB — CBC WITH DIFFERENTIAL/PLATELET
Abs Immature Granulocytes: 0.03 10*3/uL (ref 0.00–0.07)
Basophils Absolute: 0 10*3/uL (ref 0.0–0.1)
Basophils Relative: 0 %
Eosinophils Absolute: 0.1 10*3/uL (ref 0.0–0.5)
Eosinophils Relative: 1 %
HCT: 36.2 % (ref 36.0–46.0)
Hemoglobin: 11.9 g/dL — ABNORMAL LOW (ref 12.0–15.0)
Immature Granulocytes: 0 %
Lymphocytes Relative: 22 %
Lymphs Abs: 1.8 10*3/uL (ref 0.7–4.0)
MCH: 31.3 pg (ref 26.0–34.0)
MCHC: 32.9 g/dL (ref 30.0–36.0)
MCV: 95.3 fL (ref 80.0–100.0)
Monocytes Absolute: 0.7 10*3/uL (ref 0.1–1.0)
Monocytes Relative: 9 %
Neutro Abs: 5.5 10*3/uL (ref 1.7–7.7)
Neutrophils Relative %: 68 %
Platelets: 219 10*3/uL (ref 150–400)
RBC: 3.8 MIL/uL — ABNORMAL LOW (ref 3.87–5.11)
RDW: 14.5 % (ref 11.5–15.5)
WBC: 8.2 10*3/uL (ref 4.0–10.5)
nRBC: 0 % (ref 0.0–0.2)

## 2019-10-13 LAB — COMPREHENSIVE METABOLIC PANEL
ALT: 15 U/L (ref 0–44)
AST: 27 U/L (ref 15–41)
Albumin: 4.2 g/dL (ref 3.5–5.0)
Alkaline Phosphatase: 70 U/L (ref 38–126)
Anion gap: 13 (ref 5–15)
BUN: 18 mg/dL (ref 8–23)
CO2: 22 mmol/L (ref 22–32)
Calcium: 9.4 mg/dL (ref 8.9–10.3)
Chloride: 105 mmol/L (ref 98–111)
Creatinine, Ser: 1.1 mg/dL — ABNORMAL HIGH (ref 0.44–1.00)
GFR calc Af Amer: 54 mL/min — ABNORMAL LOW (ref >60)
GFR calc non Af Amer: 46 mL/min — ABNORMAL LOW (ref >60)
Glucose, Bld: 115 mg/dL — ABNORMAL HIGH (ref 70–99)
Potassium: 4 mmol/L (ref 3.5–5.1)
Sodium: 140 mmol/L (ref 135–145)
Total Bilirubin: 1.5 mg/dL — ABNORMAL HIGH (ref 0.3–1.2)
Total Protein: 7.7 g/dL (ref 6.5–8.1)

## 2019-10-13 LAB — RESPIRATORY PANEL BY RT PCR (FLU A&B, COVID)
Influenza A by PCR: NEGATIVE
Influenza B by PCR: NEGATIVE
SARS Coronavirus 2 by RT PCR: NEGATIVE

## 2019-10-13 LAB — PROCALCITONIN: Procalcitonin: <0.1 ng/mL

## 2019-10-13 LAB — URINALYSIS, COMPLETE (UACMP) WITH MICROSCOPIC
Bacteria, UA: NONE SEEN
Bilirubin Urine: NEGATIVE
Glucose, UA: NEGATIVE mg/dL
Hgb urine dipstick: NEGATIVE
Ketones, ur: NEGATIVE mg/dL
Leukocytes,Ua: NEGATIVE
Nitrite: NEGATIVE
Protein, ur: NEGATIVE mg/dL
Specific Gravity, Urine: 1.018 (ref 1.005–1.030)
pH: 7 (ref 5.0–8.0)

## 2019-10-13 LAB — LACTIC ACID, PLASMA: Lactic Acid, Venous: 1.5 mmol/L (ref 0.5–1.9)

## 2019-10-13 LAB — PROTIME-INR
INR: 1.8 — ABNORMAL HIGH (ref 0.8–1.2)
Prothrombin Time: 20.8 seconds — ABNORMAL HIGH (ref 11.4–15.2)

## 2019-10-13 LAB — POC SARS CORONAVIRUS 2 AG: SARS Coronavirus 2 Ag: NEGATIVE

## 2019-10-13 LAB — MRSA PCR SCREENING: MRSA by PCR: NEGATIVE

## 2019-10-13 MED ORDER — SODIUM CHLORIDE 0.9 % IV SOLN
2.0000 g | INTRAVENOUS | Status: DC
  Administered 2019-10-13: 16:00:00 2 g via INTRAVENOUS
  Filled 2019-10-13: qty 20

## 2019-10-13 MED ORDER — VANCOMYCIN HCL 1250 MG/250ML IV SOLN
1250.0000 mg | Freq: Once | INTRAVENOUS | Status: AC
  Administered 2019-10-13: 21:00:00 1250 mg via INTRAVENOUS
  Filled 2019-10-13: qty 250

## 2019-10-13 MED ORDER — ONDANSETRON HCL 4 MG/2ML IJ SOLN: 4.0000 mg | Freq: Three times a day (TID) | INTRAMUSCULAR | Status: DC | PRN

## 2019-10-13 MED ORDER — GABAPENTIN 100 MG PO CAPS
200.0000 mg | ORAL_CAPSULE | Freq: Three times a day (TID) | ORAL | Status: DC
  Administered 2019-10-13 – 2019-10-15 (×6): 200 mg via ORAL
  Filled 2019-10-13 (×7): qty 2

## 2019-10-13 MED ORDER — WARFARIN - PHARMACIST DOSING INPATIENT: Freq: Every day | Status: DC

## 2019-10-13 MED ORDER — QUETIAPINE FUMARATE 25 MG PO TABS
12.5000 mg | ORAL_TABLET | Freq: Two times a day (BID) | ORAL | Status: DC
  Administered 2019-10-13 – 2019-10-15 (×4): 12.5 mg via ORAL
  Filled 2019-10-13 (×4): qty 1

## 2019-10-13 MED ORDER — CARVEDILOL 6.25 MG PO TABS
3.1250 mg | ORAL_TABLET | Freq: Two times a day (BID) | ORAL | Status: DC
  Administered 2019-10-13 – 2019-10-15 (×5): 3.125 mg via ORAL
  Filled 2019-10-13: qty 1
  Filled 2019-10-13 (×3): qty 0.5
  Filled 2019-10-13 (×3): qty 1
  Filled 2019-10-13: qty 0.5
  Filled 2019-10-13: qty 1
  Filled 2019-10-13: qty 0.5

## 2019-10-13 MED ORDER — VANCOMYCIN HCL 750 MG/150ML IV SOLN
750.0000 mg | INTRAVENOUS | Status: DC
Start: 2019-10-14 — End: 2019-10-14
  Filled 2019-10-13: qty 150

## 2019-10-13 MED ORDER — LORAZEPAM 0.5 MG PO TABS: 0.5000 mg | ORAL_TABLET | Freq: Every evening | ORAL | Status: DC | PRN

## 2019-10-13 MED ORDER — WARFARIN SODIUM 7.5 MG PO TABS
7.5000 mg | ORAL_TABLET | Freq: Once | ORAL | Status: AC
  Administered 2019-10-13: 21:00:00 7.5 mg via ORAL
  Filled 2019-10-13: qty 1

## 2019-10-13 MED ORDER — IPRATROPIUM BROMIDE 0.02 % IN SOLN
0.5000 mg | Freq: Three times a day (TID) | RESPIRATORY_TRACT | Status: DC
  Administered 2019-10-13 – 2019-10-15 (×5): 0.5 mg via RESPIRATORY_TRACT
  Filled 2019-10-13 (×6): qty 2.5

## 2019-10-13 MED ORDER — DOCUSATE SODIUM 100 MG PO CAPS
100.0000 mg | ORAL_CAPSULE | Freq: Every day | ORAL | Status: DC
  Administered 2019-10-14 – 2019-10-15 (×2): 100 mg via ORAL
  Filled 2019-10-13 (×2): qty 1

## 2019-10-13 MED ORDER — AMLODIPINE BESYLATE 5 MG PO TABS
5.0000 mg | ORAL_TABLET | Freq: Every day | ORAL | Status: DC
  Administered 2019-10-14 – 2019-10-15 (×2): 5 mg via ORAL
  Filled 2019-10-13 (×2): qty 1

## 2019-10-13 MED ORDER — IPRATROPIUM BROMIDE 0.02 % IN SOLN: 3.0000 mL | RESPIRATORY_TRACT | Status: DC

## 2019-10-13 MED ORDER — LEVOTHYROXINE SODIUM 50 MCG PO TABS
25.0000 ug | ORAL_TABLET | Freq: Every day | ORAL | Status: DC
  Administered 2019-10-14: 06:00:00 25 ug via ORAL
  Filled 2019-10-13 (×2): qty 1

## 2019-10-13 MED ORDER — LAMOTRIGINE 25 MG PO TABS
25.0000 mg | ORAL_TABLET | Freq: Every day | ORAL | Status: DC
  Administered 2019-10-14 – 2019-10-15 (×2): 25 mg via ORAL
  Filled 2019-10-13 (×2): qty 1

## 2019-10-13 MED ORDER — SODIUM CHLORIDE 0.9 % IV SOLN
500.0000 mg | INTRAVENOUS | Status: DC
  Administered 2019-10-13: 500 mg via INTRAVENOUS
  Filled 2019-10-13: qty 500

## 2019-10-13 MED ORDER — LOSARTAN POTASSIUM 50 MG PO TABS
50.0000 mg | ORAL_TABLET | Freq: Every day | ORAL | Status: DC
  Administered 2019-10-14 – 2019-10-15 (×2): 50 mg via ORAL
  Filled 2019-10-13 (×2): qty 1

## 2019-10-13 MED ORDER — ALBUTEROL SULFATE (2.5 MG/3ML) 0.083% IN NEBU: 3.0000 mL | INHALATION_SOLUTION | RESPIRATORY_TRACT | Status: DC | PRN

## 2019-10-13 MED ORDER — VITAMIN B-12 1000 MCG PO TABS
1000.0000 ug | ORAL_TABLET | Freq: Every day | ORAL | Status: DC
  Administered 2019-10-14 – 2019-10-15 (×2): 1000 ug via ORAL
  Filled 2019-10-13 (×2): qty 1

## 2019-10-13 MED ORDER — SODIUM CHLORIDE 0.9 % IV SOLN
INTRAVENOUS | Status: DC | PRN
  Administered 2019-10-13: 19:00:00 250 mL via INTRAVENOUS

## 2019-10-13 MED ORDER — PIPERACILLIN-TAZOBACTAM 3.375 G IVPB
3.3750 g | Freq: Three times a day (TID) | INTRAVENOUS | Status: DC
  Administered 2019-10-13 – 2019-10-14 (×3): 3.375 g via INTRAVENOUS
  Filled 2019-10-13 (×3): qty 50

## 2019-10-13 MED ORDER — DONEPEZIL HCL 5 MG PO TABS
10.0000 mg | ORAL_TABLET | Freq: Every day | ORAL | Status: DC
  Administered 2019-10-13 – 2019-10-14 (×2): 10 mg via ORAL
  Filled 2019-10-13 (×2): qty 2

## 2019-10-13 MED ORDER — HYDRALAZINE HCL 20 MG/ML IJ SOLN
5.0000 mg | INTRAMUSCULAR | Status: DC | PRN
  Administered 2019-10-13: 17:00:00 5 mg via INTRAVENOUS
  Filled 2019-10-13: qty 1
  Filled 2019-10-13: qty 0.25

## 2019-10-13 MED ORDER — ACETAMINOPHEN 325 MG PO TABS: 650.0000 mg | ORAL_TABLET | Freq: Four times a day (QID) | ORAL | Status: DC | PRN

## 2019-10-13 MED ORDER — VANCOMYCIN HCL 1.25 G IV SOLR
1250.0000 mg | Freq: Once | INTRAVENOUS | Status: DC
Start: 2019-10-13 — End: 2019-10-13
  Filled 2019-10-13 (×2): qty 1250

## 2019-10-13 MED ORDER — DULOXETINE HCL 20 MG PO CPEP
20.0000 mg | ORAL_CAPSULE | Freq: Every day | ORAL | Status: DC
  Administered 2019-10-14 – 2019-10-15 (×2): 20 mg via ORAL
  Filled 2019-10-13 (×2): qty 1

## 2019-10-13 MED ORDER — CITALOPRAM HYDROBROMIDE 20 MG PO TABS
20.0000 mg | ORAL_TABLET | Freq: Every day | ORAL | Status: DC
  Administered 2019-10-14 – 2019-10-15 (×2): 20 mg via ORAL
  Filled 2019-10-13 (×2): qty 1

## 2019-10-13 MED ORDER — GUAIFENESIN ER 600 MG PO TB12
600.0000 mg | ORAL_TABLET | Freq: Two times a day (BID) | ORAL | Status: DC | PRN
  Administered 2019-10-15: 11:00:00 600 mg via ORAL
  Filled 2019-10-13: qty 1

## 2019-10-13 NOTE — ED Provider Notes (Signed)
[facility] Emergency Department Provider Note  ____________________________________________   First MD Initiated Contact with Patient 10/13/19 1330     (approximate)  I have reviewed the triage vital signs and the nursing notes.  History  Chief Complaint Altered Mental Status    HPI Sally Carroll is a 86 y.o. female past medical history as below, including dementia, breast cancer, permanent pacemaker in place, who presents to the emergency department for altered mental status above baseline.  Patient resides in a memory care facility, who states despite her history of dementia, she is not as talkative and interactive as she normally is.  Typically engages in conversation, ambulates without difficulty, both of which have not been occurring since Monday. They have not noticed any focal infectious type symptoms.  She was seen here for an unwitnessed fall on 2/8, with negative CT head and XR pelvis, basic labs and urine unremarkable.  Did have her second COVID vaccine yesterday, but facility specifies that her symptoms preceded this.  Caveat: History/ROS limited by patient's AMS, dementia.   Past Medical Hx Past Medical History:  Diagnosis Date  . Anemia   . Anxiety   . Arthritis   . Breast cancer ([place]) 2011   rt- radiation  . Cancer [facility]) 2011   rt breast  . Depression   . Dysrhythmia    CHB s/p PPM, afib history  . GERD (gastroesophageal reflux disease)   . Heart murmur   . Hypertension   . Personal history of radiation therapy   . Presence of permanent cardiac pacemaker     Problem List Patient Active Problem List   Diagnosis Date Noted  . Intermediate stage dry age-related macular degeneration of both eyes 07/01/2017  . Pterygium eye, left 04/02/2017  . Late onset Alzheimer's disease with behavioral disturbance ([place]) 11/01/2016  . Rotator cuff tendinitis, left 07/29/2016  . Lumbar spondylosis 05/27/2016  . Pneumonia 11/09/2015  .  Herniated lumbar intervertebral disc 02/10/2015  . 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 Lumbar radiculitis 11/11/2014  . Primary osteoarthritis of right hip 10/24/2014  . Absolute anemia 01/14/2014  . Long term (current) use of anticoagulants 12/31/2013  . NSVT (nonsustained ventricular tachycardia) ([place]) 09/04/2011  . Anxiety and depression 08/29/2011  . A-fib ([place]) 08/29/2011  . Breast CA ([place]) 08/29/2011  . Artificial cardiac pacemaker 08/29/2011  . Acquired complete AV block ([place]) 08/29/2011  . Hyperlipidemia, unspecified 08/29/2011  . Hypertension 08/29/2011  . Restless leg syndrome 08/29/2011    Past Surgical Hx Past Surgical History:  Procedure Laterality Date  . BREAST BIOPSY Right 2011   right lumpectomy rad  . BREAST SURGERY Right    bx  . EYE SURGERY Bilateral    cataracts  . LUMBAR LAMINECTOMY/DECOMPRESSION MICRODISCECTOMY Right 02/10/2015   Procedure: Right Lumbar Four-Five Microdiskectomy;  Surgeon: Erline Levine, MD;  Location: [facility];  Service: Neurosurgery;  Laterality: Right;  Right L4-5 Microdiskectomy  . PACEMAKER INSERTION     99.07    Medications Prior to Admission medications   Medication Sig Start Date End Date Taking? Authorizing Provider  acetaminophen (TYLENOL) 325 MG tablet Take 650 mg by mouth 3 (three) times daily. Can take twice daily as needed for pain    [provider]  carvedilol (COREG) 3.125 MG tablet Take 3.125 mg by mouth 2 (two) times daily with a meal.     [provider]  citalopram (CELEXA) 20 MG tablet Take 20 mg by  mouth daily.     [provider]  docusate sodium (COLACE) 100 MG capsule Take 100 mg by mouth 2 (two) times daily.    [provider]  donepezil (ARICEPT) 10 MG tablet Take 10 mg by mouth at bedtime.     [provider]  DULoxetine (CYMBALTA) 20 MG capsule Take 20 mg by mouth daily.    [provider]    gabapentin (NEURONTIN) 100 MG capsule Take 200 mg by mouth 3 (three) times daily.    [provider]  guaiFENesin (MUCINEX) 600 MG 12 hr tablet Take 600 mg by mouth 2 (two) times daily as needed for to loosen phlegm.    [provider]  lamoTRIgine (LAMICTAL) 25 MG tablet Take 25 mg by mouth daily.    [provider]  levothyroxine (SYNTHROID) 25 MCG tablet Take 25 mcg by mouth daily before breakfast.    [provider]  LORazepam (ATIVAN) 0.5 MG tablet Take 0.5 mg by mouth daily as needed (agitation).    [provider]  losartan (COZAAR) 50 MG tablet Take 50 mg by mouth daily.    [provider]  QUEtiapine (SEROQUEL) 25 MG tablet Take 25 mg by mouth 2 (two) times daily.     [provider]  vitamin B-12 (CYANOCOBALAMIN) 1000 MCG tablet Take 1,000 mcg by mouth daily.    [provider]  warfarin (COUMADIN) 5 MG tablet Take 5 mg by mouth every Monday, Tuesday, Wednesday, Thursday, and Friday.     [provider]  warfarin (COUMADIN) 7.5 MG tablet Take 7.5 mg by mouth See admin instructions. Saturday and Sunday    [provider]    Allergies Naproxen, Sucralfate, and Prednisone  Family Hx Family History  Problem Relation Age of Onset  . COPD Mother   . Stroke Mother   . Atrial fibrillation Mother   . Lung cancer Father   . Breast cancer Neg Hx     Social Hx Social History   Tobacco Use  . Smoking status: Never Smoker  . Smokeless tobacco: Never Used  Substance Use Topics  . Alcohol use: No  . Drug use: No     Review of Systems Unable to obtain due to AMS, dementia.   Physical Exam  Vital Signs: ED Triage Vitals  Enc Vitals Group     BP 10/13/19 1329 (!) 193/97     Pulse Rate 10/13/19 1329 87     Resp 10/13/19 1329 (!) 25     Temp 10/13/19 1329 99.2 F (37.3 C)     Temp Source 10/13/19 1329 Oral     SpO2 10/13/19 1329 98 %     Weight 10/13/19 1330 149 lb 14.6 oz (68 kg)      Height 10/13/19 1330 5\' 3"  (1.6 m)     Head Circumference --      Peak Flow --      Pain Score --      Pain Loc --      Pain Edu? --      Excl. in [place]? --     Constitutional: Awakens to stimulation. Does not answer questions. Appears uncomfortable.  Head: Normocephalic. Atraumatic. Eyes: Conjunctivae clear. Sclera anicteric. Nose: No congestion. No rhinorrhea. Mouth/Throat: Wearing mask.  Neck: No stridor.   Cardiovascular: Normal rate, regular rhythm - paced. Extremities well perfused. Respiratory: Mild tachypnea to mid 20s. Coarse lung sounds on R. Gastrointestinal: Soft.  Non-distended. Moans with general palpation. Musculoskeletal: No lower extremity edema.  No deformities. Neurologic:  Baseline dementia. No focal deficits appreciated. Skin: Skin is warm, dry and intact. No rash noted. Psychiatric: Unable to assess due to AMS.  EKG  Personally reviewed.   Rate: 80 Rhythm: paced Axis: LAD Intervals: abnormal due to paced rhythm Paced Negative Sgarbossa criteria    Radiology  CXR: IMPRESSION:  Mild multifocal right lung opacities are noted concerning for  pneumonia.   CT head: IMPRESSION:  1. No acute intracranial pathology.  2. Chronic microvascular disease and cerebral atrophy.    Procedures  Procedure(s) performed (including critical care):  Procedures   Initial Impression / Assessment and Plan / ED Course  86 y.o. female who presents to the ED for AMS above baseline dementia.   Ddx: intracranial injury, AKI, electrolyte abnormality, UTI, COVID, PNA, other infection  Will evaluate with labs, imaging  Work-up concerning for right-sided pneumonia with multifocal opacities. Interestingly, COVID testing and procalcitonin are  Both negative.  Concern for COVID does remain on differential as her living facility did just recently have a positive case.  However, given negative swab, along with her clinical presentation and XR findings, will opt to treat with  antibiotics. Will admit.   Discussed with hospitalist for admission.   Final Clinical Impression(s) / ED Diagnosis  Final diagnoses:  Altered mental status, unspecified altered mental status type       Note:  This document was prepared using Dragon voice recognition software and may include unintentional dictation errors.   Lilia Pro., MD 10/13/19 [phone]

## 2019-10-13 NOTE — H&P (Signed)
History and Physical    Sally Carroll [id number] DOB: 21-Jun-1934 DOA: 10/13/2019  Referring MD/NP/PA:   PCP: Sally Harrier, MD   Patient coming from:  The patient is coming from memory care facility.  At baseline, pt is dependent for most of ADL.        Chief Complaint: AMS  HPI: Sally Carroll is a 86 y.o. female with medical history significant of dementia, hypertension, pacemaker placed, atrial fibrillation on Coumadin, GERD, hypothyroidism, depression with anxiety, breast cancer, anemia, CKD-3a, who presents with altered mental status.  Per report, pt has hx of dementia, but at baseline, she is able to will walk and talk some. She has been more confused since Monday. She has not been walking or talking as much as her usual. She was seen here for an unwitnessed fall on 2/8. Pt was seen in ED. She had negative CT head and X-ray of pelvis, and unremarkable basic labs and UA. Pt has low grade fever in facility. Pt has been hypertensive despite taking all of her meds. Pt had her second COVID vaccine yesterday, but per EDP, the facility specifies that her symptoms preceded this. When I saw pt in ED, she is alert, but confused, not oriented x 3. She moves all extremities.  No active shortness breath, cough, nausea, vomiting, diarrhea noted.  ED Course: pt was found to have WBC 8.2, lactic acid 1.5, Covid Ag test negative, Covid RVP negative, renal function close to baseline, temperature 99.2, blood pressure 207/137, 180/114, heart rate 75, tachypnea with RR 39, oxygen saturation 99-100% on room air.  Chest x-ray showed right sided multifocal infiltration. The repeated CT-head is negative for acute intracranial abnormalities today. Pt is admitted to [facility] bed as inpatient.  Review of Systems: Could not be reviewed due to dementia and altered mental status.  Allergy:  Allergies  Allergen Reactions  . Naproxen     Other reaction(s): Other (See Comments) GI upset  . Sucralfate      Other reaction(s): Other (See Comments) Abdominal bloating  . Prednisone Anxiety    Shakes    Past Medical History:  Diagnosis Date  . Anemia   . Anxiety   . Arthritis   . Breast cancer ([place]) 2011   rt- radiation  . Cancer [facility]) 2011   rt breast  . Depression   . Dysrhythmia    CHB s/p PPM, afib history  . GERD (gastroesophageal reflux disease)   . Heart murmur   . Hypertension   . Personal history of radiation therapy   . Presence of permanent cardiac pacemaker     Past Surgical History:  Procedure Laterality Date  . BREAST BIOPSY Right 2011   right lumpectomy rad  . BREAST SURGERY Right    bx  . EYE SURGERY Bilateral    cataracts  . LUMBAR LAMINECTOMY/DECOMPRESSION MICRODISCECTOMY Right 02/10/2015   Procedure: Right Lumbar Four-Five Microdiskectomy;  Surgeon: Erline Levine, MD;  Location: [facility];  Service: Neurosurgery;  Laterality: Right;  Right L4-5 Microdiskectomy  . PACEMAKER INSERTION     99.07    Social History:  reports that she has never smoked. She has never used smokeless tobacco. She reports that she does not drink alcohol or use drugs.  Family History:  Family History  Problem Relation Age of Onset  . COPD Mother   . Stroke Mother   . Atrial fibrillation Mother   . Lung cancer Father   . Breast cancer Neg Hx  Prior to Admission medications   Medication Sig Start Date End Date Taking? Authorizing Provider  acetaminophen (TYLENOL) 325 MG tablet Take 650 mg by mouth 2 (two) times daily as needed for mild pain or moderate pain.   Yes [provider]  acetaminophen (TYLENOL) 500 MG tablet Take 1,000 mg by mouth 3 (three) times daily.    Yes [provider]  carvedilol (COREG) 6.25 MG tablet Take 3.125 mg by mouth 2 (two) times daily with a meal.    Yes [provider]  citalopram (CELEXA) 20 MG tablet Take 20 mg by mouth daily.    Yes [provider]  docusate sodium (COLACE) 100 MG capsule Take 100 mg by  mouth daily.    Yes [provider]  donepezil (ARICEPT) 10 MG tablet Take 10 mg by mouth at bedtime.    Yes [provider]  DULoxetine (CYMBALTA) 20 MG capsule Take 20 mg by mouth daily.   Yes [provider]  gabapentin (NEURONTIN) 100 MG capsule Take 200 mg by mouth 3 (three) times daily.   Yes [provider]  guaiFENesin (MUCINEX) 600 MG 12 hr tablet Take 600 mg by mouth 2 (two) times daily as needed for to loosen phlegm.   Yes [provider]  lamoTRIgine (LAMICTAL) 25 MG tablet Take 25 mg by mouth daily.   Yes [provider]  levothyroxine (SYNTHROID) 25 MCG tablet Take 25 mcg by mouth daily before breakfast.   Yes [provider]  LORazepam (ATIVAN) 0.5 MG tablet Take 0.5 mg by mouth at bedtime as needed (agitation).    Yes [provider]  LORazepam (ATIVAN) 0.5 MG tablet Take 0.5 mg by mouth daily as needed for sedation.   Yes [provider]  losartan (COZAAR) 50 MG tablet Take 50 mg by mouth daily.   Yes [provider]  QUEtiapine (SEROQUEL) 25 MG tablet Take 12.5 mg by mouth 2 (two) times daily.    Yes [provider]  vitamin B-12 (CYANOCOBALAMIN) 1000 MCG tablet Take 1,000 mcg by mouth daily.   Yes [provider]  warfarin (COUMADIN) 5 MG tablet Take 5 mg by mouth every Monday, Tuesday, Wednesday, Thursday, and Friday.    Yes [provider]  warfarin (COUMADIN) 7.5 MG tablet Take 7.5 mg by mouth See admin instructions. Saturday and Sunday   Yes [provider]    Physical Exam: Vitals:   10/13/19 1530 10/13/19 1555 10/13/19 1556 10/13/19 1612  BP: (!) 166/112 (!) 207/137 (!) 180/114 (!) 191/90  Pulse:    74  Resp:  (!) 22 (!) 39 20  Temp:      TempSrc:      SpO2:    100%  Weight:      Height:       General: Not in acute distress HEENT:       Eyes: PERRL, EOMI, no scleral icterus.       ENT: No discharge from the ears and nose, no pharynx  injection, no tonsillar enlargement.        Neck: No JVD, no bruit, no mass felt. Heme: No neck lymph node enlargement. Cardiac: S1/S2, RRR, has murmurs, No gallops or rubs. Respiratory: No rales, wheezing, rhonchi or rubs. GI: Soft, nondistended, nontender, no rebound pain, no organomegaly, BS present. GU: No hematuria Ext: No pitting leg edema bilaterally. 2+DP/PT pulse bilaterally. Musculoskeletal: No joint deformities, No joint redness or warmth, no limitation of ROM in spin. Skin: No rashes.  Neuro:  Alert, confused, not oriented X3, cranial nerves II-XII grossly intact, moves all extremities. Psych: Patient is not psychotic, no suicidal or hemocidal ideation.  Labs on Admission: I have personally reviewed following labs and imaging studies  CBC: Recent Labs  Lab 10/11/19 1736 10/13/19 1338  WBC 6.2 8.2  NEUTROABS 3.5 5.5  HGB 10.9* 11.9*  HCT 34.4* 36.2  MCV 96.6 95.3  PLT 204 [id number]   Basic Metabolic Panel: Recent Labs  Lab 10/11/19 1736 10/13/19 1338  NA 136 140  K 4.1 4.0  CL 102 105  CO2 24 22  GLUCOSE 138* 115*  BUN 21 18  CREATININE 1.15* 1.10*  CALCIUM 8.9 9.4   GFR: Estimated Creatinine Clearance: 35.8 mL/min (A) (by C-G formula based on SCr of 1.1 mg/dL (H)). Liver Function Tests: Recent Labs  Lab 10/11/19 1736 10/13/19 1338  AST 23 27  ALT 16 15  ALKPHOS 59 70  BILITOT 0.8 1.5*  PROT 6.7 7.7  ALBUMIN 3.6 4.2   No results for input(s): LIPASE, AMYLASE in the last 168 hours. No results for input(s): AMMONIA in the last 168 hours. Coagulation Profile: No results for input(s): INR, PROTIME in the last 168 hours. Cardiac Enzymes: No results for input(s): CKTOTAL, CKMB, CKMBINDEX, TROPONINI in the last 168 hours. BNP (last 3 results) No results for input(s): PROBNP in the last 8760 hours. HbA1C: No results for input(s): HGBA1C in the last 72 hours. CBG: Recent Labs  Lab 10/11/19 1756  GLUCAP 117*   Lipid Profile: No results for input(s):  CHOL, HDL, LDLCALC, TRIG, CHOLHDL, LDLDIRECT in the last 72 hours. Thyroid Function Tests: No results for input(s): TSH, T4TOTAL, FREET4, T3FREE, THYROIDAB in the last 72 hours. Anemia Panel: No results for input(s): VITAMINB12, FOLATE, FERRITIN, TIBC, IRON, RETICCTPCT in the last 72 hours. Urine analysis:    Component Value Date/Time   COLORURINE YELLOW (A) 10/13/2019 1338   APPEARANCEUR CLEAR (A) 10/13/2019 1338   APPEARANCEUR Clear 07/07/2018 1027   LABSPEC 1.018 10/13/2019 1338   LABSPEC 1.024 01/04/2014 1517   PHURINE 7.0 10/13/2019 1338   GLUCOSEU NEGATIVE 10/13/2019 1338   GLUCOSEU Negative 01/04/2014 1517   HGBUR NEGATIVE 10/13/2019 1338   BILIRUBINUR NEGATIVE 10/13/2019 1338   BILIRUBINUR Negative 07/07/2018 1027   BILIRUBINUR Negative 01/04/2014 1517   KETONESUR NEGATIVE 10/13/2019 1338   PROTEINUR NEGATIVE 10/13/2019 1338   NITRITE NEGATIVE 10/13/2019 1338   LEUKOCYTESUR NEGATIVE 10/13/2019 1338   LEUKOCYTESUR Negative 01/04/2014 1517   Sepsis Labs: @LABRCNTIP (procalcitonin:4,lacticidven:4) ) Recent Results (from the past 240 hour(s))  Respiratory Panel by RT PCR (Flu A&B, Covid) - Nasopharyngeal Swab     Status: None   Collection Time: 10/13/19  2:52 PM   Specimen: Nasopharyngeal Swab  Result Value Ref Range Status   SARS Coronavirus 2 by RT PCR NEGATIVE NEGATIVE Final    Comment: (NOTE) SARS-CoV-2 target nucleic acids are NOT DETECTED. The SARS-CoV-2 RNA is generally detectable in upper respiratoy specimens during the acute phase of infection. The lowest concentration of SARS-CoV-2 viral copies this assay can detect is 131 copies/mL. A negative result does not preclude SARS-Cov-2 infection and should not be used as the sole basis for treatment or other patient management decisions. A negative result may occur with  improper specimen collection/handling, submission of specimen other than nasopharyngeal swab, presence of viral mutation(s) within the areas  targeted by this assay, and inadequate number of viral copies (<131 copies/mL). A negative result must be combined with clinical observations, patient history, and epidemiological information. The  expected result is Negative. Fact Sheet for Patients:  PinkCheek.be Fact Sheet for Healthcare Providers:  GravelBags.it This test is not yet ap proved or cleared by the [place] FDA and  has been authorized for detection and/or diagnosis of SARS-CoV-2 by FDA under an Emergency Use Authorization (EUA). This EUA will remain  in effect (meaning this test can be used) for the duration of the COVID-19 declaration under Section 564(b)(1) of the Act, 21 U.S.C. section 360bbb-3(b)(1), unless the authorization is terminated or revoked sooner.    Influenza A by PCR NEGATIVE NEGATIVE Final   Influenza B by PCR NEGATIVE NEGATIVE Final    Comment: (NOTE) The Xpert Xpress SARS-CoV-2/FLU/RSV assay is intended as an aid in  the diagnosis of influenza from Nasopharyngeal swab specimens and  should not be used as a sole basis for treatment. Nasal washings and  aspirates are unacceptable for Xpert Xpress SARS-CoV-2/FLU/RSV  testing. Fact Sheet for Patients: PinkCheek.be Fact Sheet for Healthcare Providers: GravelBags.it This test is not yet approved or cleared by the [place] FDA and  has been authorized for detection and/or diagnosis of SARS-CoV-2 by  FDA under an Emergency Use Authorization (EUA). This EUA will remain  in effect (meaning this test can be used) for the duration of the  Covid-19 declaration under Section 564(b)(1) of the Act, 21  U.S.C. section 360bbb-3(b)(1), unless the authorization is  terminated or revoked. Performed at [facility], [address] [place], [place] [address]      Radiological Exams on Admission: DG Pelvis 1-2 Views  Result Date:  10/11/2019 CLINICAL DATA:  Fall. Altered mental status. Unwitnessed fall. Diffuse at baseline. More confused and normal. EXAM: PELVIS - 1-2 VIEW COMPARISON:  CT of the abdomen and pelvis on 11/13/2011 FINDINGS: There is no evidence of pelvic fracture or diastasis. No pelvic bone lesions are seen. IMPRESSION: Negative. Electronically Signed   By: Nolon Nations M.D.   On: 10/11/2019 17:30   CT HEAD WO CONTRAST  Result Date: 10/13/2019 CLINICAL DATA:  Altered mental status.  Dementia. EXAM: CT HEAD WITHOUT CONTRAST TECHNIQUE: Contiguous axial images were obtained from the base of the skull through the vertex without intravenous contrast. COMPARISON:  10/11/2019 FINDINGS: Brain: No evidence of acute infarction, hemorrhage, extra-axial collection, ventriculomegaly, or mass effect. Generalized cerebral atrophy. Periventricular white matter low attenuation likely secondary to microangiopathy. Vascular: Cerebrovascular atherosclerotic calcifications are noted. Skull: Negative for fracture or focal lesion. Sinuses/Orbits: Visualized portions of the orbits are unremarkable. Visualized portions of the paranasal sinuses are unremarkable. Visualized portions of the mastoid air cells are unremarkable. Other: None. IMPRESSION: 1. No acute intracranial pathology. 2. Chronic microvascular disease and cerebral atrophy. Electronically Signed   By: Kathreen Devoid   On: 10/13/2019 14:51   CT Head Wo Contrast  Result Date: 10/11/2019 CLINICAL DATA:  Unwitnessed fall, confusion EXAM: CT HEAD WITHOUT CONTRAST TECHNIQUE: Contiguous axial images were obtained from the base of the skull through the vertex without intravenous contrast. COMPARISON:  None. FINDINGS: Brain: There is atrophy and chronic small vessel disease changes. No acute intracranial abnormality. Specifically, no hemorrhage, hydrocephalus, mass lesion, acute infarction, or significant intracranial injury. Vascular: No hyperdense vessel or unexpected calcification.  Skull: No acute calvarial abnormality. Sinuses/Orbits: Visualized paranasal sinuses and mastoids clear. Orbital soft tissues unremarkable. Other: None IMPRESSION: Atrophy, chronic microvascular disease. No acute intracranial abnormality. Electronically Signed   By: Rolm Baptise M.D.   On: 10/11/2019 18:03   DG Chest Port 1 View  Result Date: 10/13/2019 CLINICAL  DATA:  Tachypnea. EXAM: PORTABLE CHEST 1 VIEW COMPARISON:  November 09, 2015. FINDINGS: Stable cardiomediastinal silhouette. Left-sided pacemaker is unchanged in position. No pneumothorax or pleural effusion is noted. Mild multifocal opacities are noted in the right lung concerning for pneumonia. Bony thorax is unremarkable. IMPRESSION: Mild multifocal right lung opacities are noted concerning for pneumonia. Electronically Signed   By: Marijo Conception M.D.   On: 10/13/2019 14:14     EKG: Independently reviewed.  QTc 513, paced rhythm   Assessment/Plan Principal Problem:   HCAP (healthcare-associated pneumonia) Active Problems:   Anxiety and depression   A-fib (HCC)   Hypertension   Long term (current) use of anticoagulants   Acute metabolic encephalopathy   Hypothyroid   CKD (chronic kidney disease), stage IIIa   Possible HCAP vs. Aspiration PAN: No active respiratory distress, active cough noted, but patient is tachypneic with RR 39.  No oxygen desaturation.  Chest x-ray showed right sided mild multifocal infiltration. Negative covid19 RVP test and Ag test.  - will admit to med-surg bed as inpt - IV Vancomycin and Zosyn (pt received Rocephin and azithromycin in ED) - Mucinex for cough  - Bronchodilators - Urine legionella and S. pneumococcal antigen - Follow up blood culture x2, sputum culture.  A-fib [facility]): -continue coreg -coumadin per pharm  Anxiety and depression: -continue home   HTN: bp is elevated at 207/137 -->180/114 -Continue home medications: Coreg and Cozaar -Amlodipine 5 mg daily -hydralazine prn  Acute  metabolic encephalopathy: The repeated CT head is negative for acute intracranial abnormalities.  No focal neurologic findings on physical examination. Pt has pacemaker, cannot do MRI of brain -Frequent neuro check  Hypothyroid: -Synthroid  CKD (chronic kidney disease), stage IIIa: stable. Cre 1.10 and BUN 18. -f/u by BMP   DVT ppx: on coumadin Code Status: DNR (pt has DNR document from facility) Family Communication:  Yes, patient's    at bed side Disposition Plan:  Anticipate discharge back to previous memory care facility Consults called:  none Admission status: Med-surg bed as inpt    Date of Service 10/13/2019    [facility]   If 7PM-7AM, please contact night-coverage [email or web address] 10/13/2019, 4:55 PM

## 2019-10-13 NOTE — ED Notes (Signed)
meds given for elevated bp.  Sitter with pt.  Pt resting quietly with sitter.   Pt waiting on admission.

## 2019-10-13 NOTE — ED Notes (Signed)
Report called to chris rn floor nurse 

## 2019-10-13 NOTE — ED Triage Notes (Signed)
Pt comes from [facility]. Pt has not been herself since Monday. Pt has been hypertensive despite taking all of her meds. Pt has not been walking or talking as much as her usual. She is dementia at baseline but will walk and talk some. Pt has low grade temp. Pt fell recently and was seen here. Pt did have second covid shot yesterday.

## 2019-10-13 NOTE — Consult Note (Signed)
ANTICOAGULATION CONSULT NOTE  Pharmacy Consult for warfarin dosing Indication: atrial fibrillation  Patient Measurements: Height: 5\' 3"  (160 cm) Weight: 149 lb 14.6 oz (68 kg) IBW/kg (Calculated) : 52.4  Vital Signs: Temp: 99.2 F (37.3 C) (02/10 1329) Temp Source: Oral (02/10 1329) BP: 191/90 (02/10 1612) Pulse Rate: 74 (02/10 1612)  Labs: Recent Labs    10/11/19 1736 10/13/19 1338  HGB 10.9* 11.9*  HCT 34.4* 36.2  PLT 204 219  CREATININE 1.15* 1.10*    Estimated Creatinine Clearance: 35.8 mL/min (A) (by C-G formula based on SCr of 1.1 mg/dL (H)).   Medical History: Past Medical History:  Diagnosis Date  . Anemia   . Anxiety   . Arthritis   . Breast cancer ([place]) 2011   rt- radiation  . Cancer [facility]) 2011   rt breast  . Depression   . Dysrhythmia    CHB s/p PPM, afib history  . GERD (gastroesophageal reflux disease)   . Heart murmur   . Hypertension   . Personal history of radiation therapy   . Presence of permanent cardiac pacemaker     Medications:  Scheduled:  . amLODipine  5 mg Oral Daily  . carvedilol  3.125 mg Oral BID WC  . [START ON 10/14/2019] citalopram  20 mg Oral Daily  . docusate sodium  100 mg Oral Daily  . donepezil  10 mg Oral QHS  . [START ON 10/14/2019] DULoxetine  20 mg Oral Daily  . gabapentin  200 mg Oral TID  . ipratropium  3 mL Inhalation Q4H  . [START ON 10/14/2019] lamoTRIgine  25 mg Oral Daily  . [START ON 10/14/2019] levothyroxine  25 mcg Oral QAC breakfast  . [START ON 10/14/2019] losartan  50 mg Oral Daily  . QUEtiapine  12.5 mg Oral BID  . [START ON 10/14/2019] vitamin B-12  1,000 mcg Oral Daily    Assessment:  86 y.o. female with medical history significant of dementia, hypertension, pacemaker placed, atrial fibrillation on Coumadin, GERD, hypothyroidism, depression with anxiety, breast cancer, anemia, CKD-3a, who presents with altered mental status. Home dose of warfarin is 5 mg daily except 7.5 mg Sa/Su. Baseline INR is  1.8, H&H slightly below normal limits but improved from baseline, platelets are wnl  DDIs: Synthroid (on PTA), vancomycin  Goal of Therapy:  INR 2-3 Monitor platelets by anticoagulation protocol: Yes   Plan:   Warfarin 7.5 mg tonight  Repeat INR in am  Dallie Piles 10/13/2019,5:02 PM

## 2019-10-13 NOTE — ED Notes (Signed)
Resumed care from [place] rn.  Pt almost out of bed. Charge nurse called for sitter.  Sitter now with pt.  Pt confused.  meds infusing.  Iv in place.  Paced rhythm on monitor.  siderails up x 2.

## 2019-10-13 NOTE — Consult Note (Signed)
Pharmacy Antibiotic Note  Sally Carroll is a 86 y.o. female admitted on 10/13/2019 with pneumonia.  Pharmacy has been consulted for Zosyn and vancomycin dosing. Her renal function is at what appears to be her basline.  Plan: 1) start Zosyn 3.375g IV q8h (4 hour infusion)  2) Vancomycin 1250 mg IV x 1, then 750mg  every 24 hrs Goal AUC 400-550 Expected AUC: 494 SCr used: 1.1 Css (calculated): 29.2/14.3 mcg/mL T1/2: 22.4h MRSA swab to r/o MRSA PNA  Height: 5\' 3"  (160 cm) Weight: 149 lb 14.6 oz (68 kg) IBW/kg (Calculated) : 52.4  Temp (24hrs), Avg:99.2 F (37.3 C), Min:99.2 F (37.3 C), Max:99.2 F (37.3 C)  Recent Labs  Lab 10/11/19 1736 10/13/19 1338  WBC 6.2 8.2  CREATININE 1.15* 1.10*  LATICACIDVEN  --  1.5    Estimated Creatinine Clearance: 35.8 mL/min (A) (by C-G formula based on SCr of 1.1 mg/dL (H)).    Allergies  Allergen Reactions  . Naproxen     Other reaction(s): Other (See Comments) GI upset  . Sucralfate     Other reaction(s): Other (See Comments) Abdominal bloating  . Prednisone Anxiety    Shakes    Antimicrobials this admission: Zosyn 2/10 >>  vancomycin 2/10 >>   Microbiology results: 2/10 BCx: pending 2/10 UCx: pending  2/10 SARS CoV-2: negative  2/10 MRSA PCR: pending  Thank you for allowing pharmacy to be a part of this patient's care.  Dallie Piles 10/13/2019 4:27 PM

## 2019-10-14 DIAGNOSIS — I482 Chronic atrial fibrillation, unspecified: Secondary | ICD-10-CM

## 2019-10-14 DIAGNOSIS — J690 Pneumonitis due to inhalation of food and vomit: Principal | ICD-10-CM

## 2019-10-14 LAB — URINE CULTURE: Culture: NO GROWTH

## 2019-10-14 LAB — PROTIME-INR
INR: 2.2 — ABNORMAL HIGH (ref 0.8–1.2)
Prothrombin Time: 24.2 seconds — ABNORMAL HIGH (ref 11.4–15.2)

## 2019-10-14 LAB — CREATININE, SERUM
Creatinine, Ser: 1.09 mg/dL — ABNORMAL HIGH (ref 0.44–1.00)
GFR calc Af Amer: 54 mL/min — ABNORMAL LOW (ref >60)
GFR calc non Af Amer: 47 mL/min — ABNORMAL LOW (ref >60)

## 2019-10-14 LAB — STREP PNEUMONIAE URINARY ANTIGEN: Strep Pneumo Urinary Antigen: NEGATIVE

## 2019-10-14 MED ORDER — SODIUM CHLORIDE 0.9 % IV SOLN
3.0000 g | Freq: Four times a day (QID) | INTRAVENOUS | Status: DC
  Administered 2019-10-14 – 2019-10-15 (×4): 3 g via INTRAVENOUS
  Filled 2019-10-14 (×2): qty 3
  Filled 2019-10-14 (×2): qty 8
  Filled 2019-10-14 (×2): qty 3

## 2019-10-14 MED ORDER — ENSURE ENLIVE PO LIQD
237.0000 mL | Freq: Two times a day (BID) | ORAL | Status: DC
  Administered 2019-10-14 – 2019-10-15 (×2): 237 mL via ORAL

## 2019-10-14 MED ORDER — WARFARIN SODIUM 5 MG PO TABS
5.0000 mg | ORAL_TABLET | Freq: Once | ORAL | Status: AC
  Administered 2019-10-14: 5 mg via ORAL
  Filled 2019-10-14: qty 1

## 2019-10-14 MED ORDER — HYDRALAZINE HCL 20 MG/ML IJ SOLN
10.0000 mg | Freq: Four times a day (QID) | INTRAMUSCULAR | Status: DC | PRN
  Administered 2019-10-14: 10 mg via INTRAVENOUS
  Filled 2019-10-14 (×2): qty 0.5

## 2019-10-14 NOTE — Evaluation (Signed)
Clinical/Bedside Swallow Evaluation Patient Details  Name: DAFINA LASHLEE MRN: [id number] Date of Birth: 1934-08-22  Today's Date: 10/14/2019 Time: SLP Start Time (ACUTE ONLY): 1545 SLP Stop Time (ACUTE ONLY): 1635 SLP Time Calculation (min) (ACUTE ONLY): 50 min  Past Medical History:  Past Medical History:  Diagnosis Date  . Anemia   . Anxiety   . Arthritis   . Breast cancer ([place]) 2011   rt- radiation  . Cancer [facility]) 2011   rt breast  . Depression   . Dysrhythmia    CHB s/p PPM, afib history  . GERD (gastroesophageal reflux disease)   . Heart murmur   . Hypertension   . Personal history of radiation therapy   . Presence of permanent cardiac pacemaker    Past Surgical History:  Past Surgical History:  Procedure Laterality Date  . BREAST BIOPSY Right 2011   right lumpectomy rad  . BREAST SURGERY Right    bx  . EYE SURGERY Bilateral    cataracts  . LUMBAR LAMINECTOMY/DECOMPRESSION MICRODISCECTOMY Right 02/10/2015   Procedure: Right Lumbar Four-Five Microdiskectomy;  Surgeon: Erline Levine, MD;  Location: [facility];  Service: Neurosurgery;  Laterality: Right;  Right L4-5 Microdiskectomy  . PACEMAKER INSERTION     99.07   HPI:  Patient is a 86 y.o. female with medical history significant of Dementia, hypertension, pacemaker placed, atrial fibrillation on Coumadin, GERD, hypothyroidism, depression with anxiety, breast cancer, anemia, CKD-3a, who presents with altered mental status.  Patient came in from memory care unit at [facility] with increasing confusion not her usual self; recent Fall.  CXR revealed: "Mild multifocal right lung opacities are noted", however, pt has not had any other Pulmonary assessment or CXR since 2017.  Pt is dependent for most ADLs at living facility per report.   Assessment / Plan / Recommendation Clinical Impression  Pt appears to present w/ Oral phase dysphagia impacted by Significant Cognitive decline; pt has a Baseline of Dementia. Pt is also  dependent for most ADLs - unsure if also for feeding at meals. Any oral phase deficits and decreased awwareness w/ swallowing tasks can increase risk for aspiration to occur. Aspiration precautions and Supervision w/ oral intake/Feeding support given -- this appeared to reduce her risk for aspiration. However, during trials, pt exhibited a mild cough x1 w/ sips of thin liquids via Straw -- she appeared distracted looking away from SLP followed by coughing. No other overt clinical s/s of aspiration noted during/post trials given both via cup/straw -- encouraged pt to help hold Cup when drinking; trials of puree were adequate. No decline in respiratory status noted during/post trials. Oral phase deficits noted impacted by Cognitive decline, Dementia. Pt exhibited oral prep and oral acceptance deficits c/b hesitation and slow movements during bolus acceptance. She seemed apprehensive in taking po trials w/ SLP. After min slower bolus management, A-P transfer occurred followed by swallowing. Oral clearing was achieved w/ boluses given -- No trials of solid foods were attempted d/t pt's Cognitive decline and overall presentation today. OM exam was limited d/t Cognitive status; no unilateral weakness was apparent during bolus management and no anterior leakage ocurred w/ trials. Pt was able to Hold Cup to help feed self.  Due to pt's presentation at this time, recommend a Dysphagia level 1 (puree) diet w/ Thin liquids w/ Aspiration precautions -- monitor straw use and discontinue if increased coughing noted. Reduce distractions during meals. Feeding Support at meals. Pills in Puree -- Crushed for easier swallowing. ST services  will continue to f/u w/ pt's status and monitor toleration of diet, po's for any need to modify liquid consistency in diet. MD and NSG updated.  SLP Visit Diagnosis: Dysphagia, oropharyngeal phase (R13.12)(Cognitive decline)    Aspiration Risk  Mild aspiration risk;Risk for inadequate  nutrition/hydration(reduced following aspiration precautions)    Diet Recommendation  Dysphagia level 1 (puree) w/ Thin liquids via Cup - monitor any straw use. Aspiration precautions; feeding support and assistance at all meals d/t Cognitive decline  Medication Administration: Crushed with puree(for safer swallowing -- for discharge as well)    Other  Recommendations Recommended Consults: (Dietician f/u for support) Oral Care Recommendations: Oral care BID;Oral care before and after PO;Staff/trained caregiver to provide oral care Other Recommendations: (n/a at this time)   Follow up Recommendations (TBD)      Frequency and Duration min 2x/week  1 week       Prognosis Prognosis for Safe Diet Advancement: Fair Barriers to Reach Goals: Cognitive deficits;Time post onset;Severity of deficits      Swallow Study   General Date of Onset: 10/13/19 HPI: Patient is a 86 y.o. female with medical history significant of Dementia, hypertension, pacemaker placed, atrial fibrillation on Coumadin, GERD, hypothyroidism, depression with anxiety, breast cancer, anemia, CKD-3a, who presents with altered mental status.  Patient came in from memory care unit at home place with increasing confusion not her usual self.  CXR revealed: "Mild multifocal right lung opacities are noted", however, pt has not had any other Pulmonary assessment or CXR since 2017.  Type of Study: Bedside Swallow Evaluation Previous Swallow Assessment: none noted Diet Prior to this Study: NPO Temperature Spikes Noted: No(wbc 8.2) Respiratory Status: Room air History of Recent Intubation: No Behavior/Cognition: Cooperative;Confused;Distractible;Requires cueing;Doesn't follow directions(Nonverbal) Oral Cavity Assessment: (limited exam d/t Cognitive status) Oral Care Completed by SLP: Yes(attempted by limited d/t Cognitive status) Oral Cavity - Dentition: Adequate natural dentition Vision: Functional for self-feeding Self-Feeding  Abilities: Able to feed self;Needs assist;Needs set up;Total assist Patient Positioning: Upright in bed(needed full positioning) Baseline Vocal Quality: (nonverbal) Volitional Cough: Cognitively unable to elicit Volitional Swallow: Unable to elicit    Oral/Motor/Sensory Function Overall Oral Motor/Sensory Function: Within functional limits(w/ bolus management and oral clearing)   Ice Chips Ice chips: Within functional limits Presentation: Spoon(fed; 3 trials) Other Comments: min limited decreased oral movements; min slower   Thin Liquid Thin Liquid: Impaired Presentation: Cup;Self Fed;Straw(4 trials via each) Oral Phase Impairments: Poor awareness of bolus(min) Oral Phase Functional Implications: (none) Pharyngeal  Phase Impairments: Cough - Immediate(x1/8 trials via straw) Other Comments: pt demonstrated much slower oral prep and initiation w/ task; hesitation/apprehension; significant confusion w/ task appearing to slow her follow through    Nectar Thick Nectar Thick Liquid: Not tested   Honey Thick Honey Thick Liquid: Not tested   Puree Puree: Within functional limits Presentation: Spoon(fed; 6 trials) Other Comments: grossly wfl w/ min slower oral prep and initiation w/ task   Solid     Solid: Not tested Other Comments: d/t Cognitive status       Orinda Kenner, MS, CCC-SLP Jenalee Trevizo 10/14/2019,4:27 PM

## 2019-10-14 NOTE — Progress Notes (Signed)
[facility] [place] NAME: Sally Carroll    MR#:  [id number]  DATE OF BIRTH:  1934-01-01  SUBJECTIVE:   Patient came in from memory care unit at home [facility] with increasing confusion not her usual self. She remains confused at baseline. No fever. No other issues per RN  not much conversive after waking her up from sleep REVIEW OF SYSTEMS:   Review of Systems  Unable to perform ROS: Dementia   Tolerating Diet: Tolerating PT: ambulatory per family  DRUG ALLERGIES:   Allergies  Allergen Reactions  . Naproxen     Other reaction(s): Other (See Comments) GI upset  . Sucralfate     Other reaction(s): Other (See Comments) Abdominal bloating  . Prednisone Anxiety    Shakes    VITALS:  Blood pressure (!) 171/93, pulse 75, temperature 98.4 F (36.9 C), resp. rate 12, height 5\' 3"  (1.6 m), weight 68 kg, SpO2 98 %.  PHYSICAL EXAMINATION:   Physical Exam Limited due to dementia GENERAL:  86 y.o.-year-old patient lying in the bed with no acute distress.  EYES: Pupils equal, round, reactive to light and accommodation. HEENT: Head atraumatic, normocephalic. Oropharynx and nasopharynx clear.  NECK:  Supple, no jugular venous distention. No thyroid enlargement, no tenderness.  LUNGS: Normal breath sounds bilaterally, no wheezing, rales, rhonchi. No use of accessory muscles of respiration.  CARDIOVASCULAR: S1, S2 normal. No murmurs, rubs, or gallops.  ABDOMEN: Soft, nontender, nondistended. Bowel sounds present. No organomegaly or mass.  EXTREMITIES: No cyanosis, clubbing or edema b/l.    NEUROLOGIC: moves all extremities well PSYCHIATRIC:  patient is alert but confused at baseline SKIN: No obvious rash, lesion, or ulcer. Per Rn  LABORATORY PANEL:  CBC Recent Labs  Lab 10/13/19 1338  WBC 8.2  HGB 11.9*  HCT 36.2  PLT 219    Chemistries  Recent Labs  Lab 10/13/19 1338 10/13/19 1338 10/14/19 0400  NA 140  --   --   K 4.0  --    --   CL 105  --   --   CO2 22  --   --   GLUCOSE 115*  --   --   BUN 18  --   --   CREATININE 1.10*   < > 1.09*  CALCIUM 9.4  --   --   AST 27  --   --   ALT 15  --   --   ALKPHOS 70  --   --   BILITOT 1.5*  --   --    < > = values in this interval not displayed.   Cardiac Enzymes No results for input(s): TROPONINI in the last 168 hours. RADIOLOGY:  CT HEAD WO CONTRAST  Result Date: 10/13/2019 CLINICAL DATA:  Altered mental status.  Dementia. EXAM: CT HEAD WITHOUT CONTRAST TECHNIQUE: Contiguous axial images were obtained from the base of the skull through the vertex without intravenous contrast. COMPARISON:  10/11/2019 FINDINGS: Brain: No evidence of acute infarction, hemorrhage, extra-axial collection, ventriculomegaly, or mass effect. Generalized cerebral atrophy. Periventricular white matter low attenuation likely secondary to microangiopathy. Vascular: Cerebrovascular atherosclerotic calcifications are noted. Skull: Negative for fracture or focal lesion. Sinuses/Orbits: Visualized portions of the orbits are unremarkable. Visualized portions of the paranasal sinuses are unremarkable. Visualized portions of the mastoid air cells are unremarkable. Other: None. IMPRESSION: 1. No acute intracranial pathology. 2. Chronic microvascular disease and cerebral atrophy. Electronically Signed   By: Kathreen Devoid  On: 10/13/2019 14:51   DG Chest Port 1 View  Result Date: 10/13/2019 CLINICAL DATA:  Tachypnea. EXAM: PORTABLE CHEST 1 VIEW COMPARISON:  November 09, 2015. FINDINGS: Stable cardiomediastinal silhouette. Left-sided pacemaker is unchanged in position. No pneumothorax or pleural effusion is noted. Mild multifocal opacities are noted in the right lung concerning for pneumonia. Bony thorax is unremarkable. IMPRESSION: Mild multifocal right lung opacities are noted concerning for pneumonia. Electronically Signed   By: Marijo Conception M.D.   On: 10/13/2019 14:14   ASSESSMENT AND PLAN:  Sally Carroll is a 86 y.o. female with medical history significant of dementia, hypertension, pacemaker placed, atrial fibrillation on Coumadin, GERD, hypothyroidism, depression with anxiety, breast cancer, anemia, CKD-3a, who presents with altered mental status.  Acute encephalopathy/AMS suspected due to right sided Pneumonia likely Aspiration  - No active respiratory distress, active cough noted, but patient was tachypneic with RR 39 on admission.  No oxygen desaturation. - Chest x-ray showed right sided mild multifocal infiltration. - Negative covid19 RVP test and Ag test. - CT head is negative for acute intracranial abnormalities. -IV unasyn -Speech to evaluate swallowing. Patient has advanced dementia as well - Mucinex for cough  - Bronchodilators -- Follow up blood culture negative so far - sputum culture pending -pt has baseline dementia and a bit challenging to determine her baseline. -Patient remains afebrile, sats 98% on, Pro calcitonin less than 0.1.  Chronic A-fib ([place]): -continue coreg -coumadin per pharm  Anxiety and depression: -continue home meds Celexa, Cymbalta, PRN Ativan, Seroquel   HTN: bp is elevated at 207/137 -->180/114 -Continue home medications: Coreg and Cozaar -Amlodipine 5 mg daily -hydralazine prn  Known h/o Dementia -pt is from [facility] care unit at [facility] -continue Aricept  Hypothyroid: -Synthroid  CKD (chronic kidney disease), stage IIIa: stable. Creat 1.10 and BUN 18. -f/u by BMP   DVT ppx: on coumadin Code Status: DNR (pt has DNR document from facility) Family Communication:  Yes, patient's sister in healthcare power of attorney [name] Disposition Plan:  Anticipate discharge back to previous memory care facility Consults called:  none  TOTAL TIME TAKING CARE OF THIS PATIENT: *30* minutes.  >50% time spent on counselling and coordination of care  Note: This dictation was prepared with Dragon dictation along with smaller phrase  technology. Any transcriptional errors that result from this process are unintentional.  Fritzi Mandes M.D    [facility]   CC: Primary care physician; Tracie Harrier, MDPatient ID: Sally Carroll, female   DOB: Dec 03, 1933, 86 y.o.   MRN: [id number]

## 2019-10-14 NOTE — TOC Initial Note (Signed)
Transition of Care [facility]) - Initial/Assessment Note    Patient Details  Name: Sally Carroll MRN: [id number] Date of Birth: 14-Dec-1933  [facility]) CM/SW Contact:    Elease Hashimoto, LCSW Phone Number: 10/14/2019, 10:48 AM  Clinical Narrative:  Briefly introduced myself to pt who knows her name and she is in a hospital. She gave permission to call [facility] and talk with Jennifer-adm. Who reports she haas been there for over a year but has declined in the past few months. Seems she is falling in the past two months. Anderson Malta reports they do assist with her ADL's and can ambulate beside her if needed. Will ask for PT eval, due to [place] reports [facility] will decide if pt can return there or if needs a higher level of care at DC from the hospital. Will ask MD for PT eval.  Will continue to follow to assist with discharge needs.          Expected Discharge Plan: Memory Care Barriers to Discharge: Continued Medical Work up   Patient Goals and CMS Choice Patient states their goals for this hospitalization and ongoing recovery are:: Go back home      Expected Discharge Plan and Services Expected Discharge Plan: Memory Care In-house Referral: Clinical Social Work     Living arrangements for the past 2 months: [place]                                      Prior Living Arrangements/Services Living arrangements for the past 2 months: [place] Lives with:: Facility Resident(memory care)              Current home services: DME(walker at times)    Activities of Daily Living Home Assistive Devices/Equipment: None ADL Screening (condition at time of admission) Patient's cognitive ability adequate to safely complete daily activities?: No Is the patient deaf or have difficulty hearing?: No Does the patient have difficulty seeing, even when wearing glasses/contacts?: No Does the patient have difficulty concentrating, remembering, or  making decisions?: Yes Patient able to express need for assistance with ADLs?: No Does the patient have difficulty dressing or bathing?: Yes Independently performs ADLs?: No Communication: Independent Dressing (OT): Needs assistance Is this a change from baseline?: Pre-admission baseline Grooming: Needs assistance Is this a change from baseline?: Pre-admission baseline Feeding: Independent Bathing: Needs assistance Is this a change from baseline?: Pre-admission baseline Toileting: Needs assistance Is this a change from baseline?: Pre-admission baseline In/Out Bed: Needs assistance Is this a change from baseline?: Pre-admission baseline Walks in Home: Independent Does the patient have difficulty walking or climbing stairs?: Yes Weakness of Legs: None Weakness of Arms/Hands: None  Permission Sought/Granted Permission sought to share information with : Chartered certified accountant granted to share information with : Yes, Verbal Permission Granted  Share Information with NAME: jennifer-facility adm  Permission granted to share info w AGENCY: [facility]  Permission granted to share info w Relationship: sister  Permission granted to share info w Contact Information: faye  Emotional Assessment Appearance:: Appears stated age Attitude/Demeanor/Rapport: Gracious Affect (typically observed): Accepting Orientation: : Oriented to Self, Oriented to Place      Admission diagnosis:  HCAP (healthcare-associated pneumonia) [J18.9] Altered mental status, unspecified altered mental status type [R41.82] Patient Active Problem List   Diagnosis Date Noted  . HCAP (healthcare-associated pneumonia) 10/13/2019  . Acute metabolic encephalopathy [id number]  .  Hypothyroid 10/13/2019  . CKD (chronic kidney disease), stage IIIa 10/13/2019  . Dementia ([place]) 10/13/2019  . Intermediate stage dry age-related macular degeneration of both eyes 07/01/2017  . Pterygium eye, left 04/02/2017  .  Late onset Alzheimer's disease with behavioral disturbance ([place]) 11/01/2016  . Rotator cuff tendinitis, left 07/29/2016  . Lumbar spondylosis 05/27/2016  . Pneumonia 11/09/2015  . Herniated lumbar intervertebral disc 02/10/2015  . 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 Lumbar radiculitis 11/11/2014  . Primary osteoarthritis of right hip 10/24/2014  . Absolute anemia 01/14/2014  . Long term (current) use of anticoagulants 12/31/2013  . NSVT (nonsustained ventricular tachycardia) ([place]) 09/04/2011  . Anxiety and depression 08/29/2011  . A-fib ([place]) 08/29/2011  . Breast CA ([place]) 08/29/2011  . Artificial cardiac pacemaker 08/29/2011  . Acquired complete AV block ([place]) 08/29/2011  . Hyperlipidemia, unspecified 08/29/2011  . Hypertension 08/29/2011  . Restless leg syndrome 08/29/2011   PCP:  Tracie Harrier, MD Pharmacy:   [facility] [place] [address] [place] [address] Phone: [phone] Fax: [phone]     Social Determinants of Health (SDOH) Interventions    Readmission Risk Interventions No flowsheet data found.

## 2019-10-14 NOTE — Consult Note (Signed)
ANTICOAGULATION CONSULT NOTE  Pharmacy Consult for warfarin dosing Indication: atrial fibrillation  Patient Measurements: Height: 5\' 3"  (160 cm) Weight: (64.6) IBW/kg (Calculated) : 52.4  Vital Signs: Temp: 98.4 F (36.9 C) (02/11 0015) BP: 171/93 (02/11 0015) Pulse Rate: 75 (02/11 0015)  Labs: Recent Labs    10/11/19 1736 10/13/19 1338 10/14/19 0400 10/14/19 0835  HGB 10.9* 11.9*  --   --   HCT 34.4* 36.2  --   --   PLT 204 219  --   --   LABPROT  --  20.8*  --  24.2*  INR  --  1.8*  --  2.2*  CREATININE 1.15* 1.10* 1.09*  --     Estimated Creatinine Clearance: 36.2 mL/min (A) (by C-G formula based on SCr of 1.09 mg/dL (H)).   Medical History: Past Medical History:  Diagnosis Date  . Anemia   . Anxiety   . Arthritis   . Breast cancer ([place]) 2011   rt- radiation  . Cancer [facility]) 2011   rt breast  . Depression   . Dysrhythmia    CHB s/p PPM, afib history  . GERD (gastroesophageal reflux disease)   . Heart murmur   . Hypertension   . Personal history of radiation therapy   . Presence of permanent cardiac pacemaker     Medications:  Scheduled:  . amLODipine  5 mg Oral Daily  . carvedilol  3.125 mg Oral BID WC  . citalopram  20 mg Oral Daily  . docusate sodium  100 mg Oral Daily  . donepezil  10 mg Oral QHS  . DULoxetine  20 mg Oral Daily  . gabapentin  200 mg Oral TID  . ipratropium  0.5 mg Nebulization TID  . lamoTRIgine  25 mg Oral Daily  . levothyroxine  25 mcg Oral Q0600  . losartan  50 mg Oral Daily  . QUEtiapine  12.5 mg Oral BID  . vitamin B-12  1,000 mcg Oral Daily  . Warfarin - Pharmacist Dosing Inpatient   Does not apply q1800    Assessment:  86 y.o. female with medical history significant of dementia, hypertension, pacemaker placed, atrial fibrillation on Coumadin, GERD, hypothyroidism, depression with anxiety, breast cancer, anemia, CKD-3a, who presents with altered mental status. Home dose of warfarin is 5 mg daily except 7.5 mg Sa/Su.  Baseline INR is 1.8, H&H slightly below normal limits but improved from baseline, platelets are wnl  DDIs: Synthroid (on PTA), antibiotics  2/10 INR 1.8   Warfarin 7.5 mg 2/11 INR 2.2  Goal of Therapy:  INR 2-3 Monitor platelets by anticoagulation protocol: Yes   Plan:   Warfarin 5 mg tonight  f/u INR in am  Pearley Baranek A 10/14/2019,9:13 AM

## 2019-10-14 NOTE — Plan of Care (Signed)
Patient has poor appetite and requires total assistance and encouragement with meals. Remains confused and unable to follow commands. Patient is on low bed with alarm on. Requires frequent reorienting.   Problem: Education: Goal: Knowledge of General Education information will improve Description: Including pain rating scale, medication(s)/side effects and non-pharmacologic comfort measures Outcome: Not Progressing   Problem: Activity: Goal: Risk for activity intolerance will decrease Outcome: Not Progressing   Problem: Nutrition: Goal: Adequate nutrition will be maintained Outcome: Not Progressing   Problem: Safety: Goal: Ability to remain free from injury will improve Outcome: Progressing

## 2019-10-15 DIAGNOSIS — N183 Chronic kidney disease, stage 3 unspecified: Secondary | ICD-10-CM

## 2019-10-15 LAB — CBC
HCT: 34.9 % — ABNORMAL LOW (ref 36.0–46.0)
Hemoglobin: 11.4 g/dL — ABNORMAL LOW (ref 12.0–15.0)
MCH: 30.8 pg (ref 26.0–34.0)
MCHC: 32.7 g/dL (ref 30.0–36.0)
MCV: 94.3 fL (ref 80.0–100.0)
Platelets: 217 10*3/uL (ref 150–400)
RBC: 3.7 MIL/uL — ABNORMAL LOW (ref 3.87–5.11)
RDW: 14.6 % (ref 11.5–15.5)
WBC: 7.2 10*3/uL (ref 4.0–10.5)
nRBC: 0 % (ref 0.0–0.2)

## 2019-10-15 LAB — PROTIME-INR
INR: 2.1 — ABNORMAL HIGH (ref 0.8–1.2)
Prothrombin Time: 23.8 seconds — ABNORMAL HIGH (ref 11.4–15.2)

## 2019-10-15 LAB — RESPIRATORY PANEL BY RT PCR (FLU A&B, COVID)
Influenza A by PCR: NEGATIVE
Influenza B by PCR: NEGATIVE
SARS Coronavirus 2 by RT PCR: NEGATIVE

## 2019-10-15 MED ORDER — ENSURE ENLIVE PO LIQD: 237.0000 mL | Freq: Two times a day (BID) | ORAL | 12 refills | Status: AC

## 2019-10-15 MED ORDER — AMOXICILLIN-POT CLAVULANATE 875-125 MG PO TABS: 1.0000 | ORAL_TABLET | Freq: Two times a day (BID) | ORAL | Status: DC

## 2019-10-15 MED ORDER — AMOXICILLIN-POT CLAVULANATE 875-125 MG PO TABS: 1.0000 | ORAL_TABLET | Freq: Two times a day (BID) | ORAL | 0 refills | Status: AC

## 2019-10-15 MED ORDER — CARVEDILOL 6.25 MG PO TABS: 6.2500 mg | ORAL_TABLET | Freq: Two times a day (BID) | ORAL | 0 refills | Status: AC

## 2019-10-15 MED ORDER — WARFARIN SODIUM 5 MG PO TABS
5.0000 mg | ORAL_TABLET | Freq: Once | ORAL | Status: AC
  Administered 2019-10-15: 17:00:00 5 mg via ORAL
  Filled 2019-10-15: qty 1

## 2019-10-15 NOTE — Discharge Summary (Signed)
Sally Carroll NAME: Sally Carroll    MR#:  [id number]  DATE OF BIRTH:  August 30, 1934  DATE OF ADMISSION:  10/13/2019 ADMITTING PHYSICIAN: Ivor Costa, MD  DATE OF DISCHARGE: 10/15/2019  PRIMARY CARE PHYSICIAN: Tracie Harrier, MD    ADMISSION DIAGNOSIS:  HCAP (healthcare-associated pneumonia) [J18.9] Altered mental status, unspecified altered mental status type [R41.82]  DISCHARGE DIAGNOSIS:  Acute Encephalopathy improved Aspiration Pneumonia Advanced Dementia  SECONDARY DIAGNOSIS:   Past Medical History:  Diagnosis Date  . Anemia   . Anxiety   . Arthritis   . Breast cancer ([place]) 2011   rt- radiation  . Cancer [facility]) 2011   rt breast  . Depression   . Dysrhythmia    CHB s/p PPM, afib history  . GERD (gastroesophageal reflux disease)   . Heart murmur   . Hypertension   . Personal history of radiation therapy   . Presence of permanent cardiac pacemaker     HOSPITAL COURSE:  Sally Accurso Thompsonis a 86 y.o.femalewith medical history significant ofdementia,hypertension, pacemaker placed, atrial fibrillation on Coumadin, GERD, hypothyroidism, depression with anxiety, breast cancer,anemia, CKD-3a, who presents with altered mental status.  Acute metabolic encephalopathy/AMS suspected due to right sided Pneumonia likely Aspiration and mild AKI-prerenal -No active respiratory distress, active cough noted, but patient was tachypneic with RR 39 on admission. No oxygen desaturation. - Chest x-ray showed right sided mild multifocal infiltration. - Negative covid19 RVP test and Ag test. - CT head is negative for acute intracranial abnormalities. -IV unasyn--change to oral augmentin -Speech evaluation noted. Patient on pured diet with thin liquids. - Patient has advanced dementia as well - Mucinex for cough -- Follow up blood culture negative so far -pt has baseline dementia and a bit challenging to determine her  baseline. -Patient remains afebrile, sats 98% on, Pro calcitonin less than 0.1.  Mild AKI pre-renal azotemia/dehydrationCKD (chronic kidney disease), stage II: stable. Creat 1.15--IVF--1.0 and BUN 18.  Chronic A-fib (Uncertain): -continue coreg -coumadin per pharm--INR therap  Anxiety and depression: -continue home meds Celexa, Cymbalta, PRN Ativan, Seroquel  HTN:bp is elevated at 207/137 -->180/114 -Continue home medications:increased to 6.25 mg of Coreg and cont Cozaar -hydralazine prn  Known h/o Dementia -pt is from memory care unit at [facility] -continue Aricept  Hypothyroid: -Synthroid  DVT ppx:on coumadin Code Status:DNR (pt has DNR document from facility) Family Communication: Yes, patient's sister in healthcare power of attorney Sally Carroll Disposition Plan: discharge back to previous memory care facility today Consults called:none  CONSULTS OBTAINED:    DRUG ALLERGIES:   Allergies  Allergen Reactions  . Naproxen     Other reaction(s): Other (See Comments) GI upset  . Sucralfate     Other reaction(s): Other (See Comments) Abdominal bloating  . Prednisone Anxiety    Shakes    DISCHARGE MEDICATIONS:   Allergies as of 10/15/2019      Reactions   Naproxen    Other reaction(s): Other (See Comments) GI upset   Sucralfate    Other reaction(s): Other (See Comments) Abdominal bloating   Prednisone Anxiety   Shakes      Medication List    TAKE these medications   acetaminophen 325 MG tablet Commonly known as: TYLENOL Take 650 mg by mouth 2 (two) times daily as needed for mild pain or moderate pain. What changed: Another medication with the same name was removed. Continue taking this medication, and follow the directions you see here.   amoxicillin-clavulanate 875-125 MG  tablet Commonly known as: AUGMENTIN Take 1 tablet by mouth every 12 (twelve) hours for 5 days.   carvedilol 6.25 MG tablet Commonly known as: COREG Take 1 tablet (6.25 mg  total) by mouth 2 (two) times daily with a meal. What changed: how much to take   citalopram 20 MG tablet Commonly known as: CELEXA Take 20 mg by mouth daily.   docusate sodium 100 MG capsule Commonly known as: COLACE Take 100 mg by mouth daily.   donepezil 10 MG tablet Commonly known as: ARICEPT Take 10 mg by mouth at bedtime.   DULoxetine 20 MG capsule Commonly known as: CYMBALTA Take 20 mg by mouth daily.   feeding supplement (ENSURE ENLIVE) Liqd Take 237 mLs by mouth 2 (two) times daily between meals.   gabapentin 100 MG capsule Commonly known as: NEURONTIN Take 200 mg by mouth 3 (three) times daily.   guaiFENesin 600 MG 12 hr tablet Commonly known as: MUCINEX Take 600 mg by mouth 2 (two) times daily as needed for to loosen phlegm.   lamoTRIgine 25 MG tablet Commonly known as: LAMICTAL Take 25 mg by mouth daily.   levothyroxine 25 MCG tablet Commonly known as: SYNTHROID Take 25 mcg by mouth daily before breakfast.   LORazepam 0.5 MG tablet Commonly known as: ATIVAN Take 0.5 mg by mouth at bedtime as needed (agitation). What changed: Another medication with the same name was removed. Continue taking this medication, and follow the directions you see here.   losartan 50 MG tablet Commonly known as: COZAAR Take 50 mg by mouth daily.   QUEtiapine 25 MG tablet Commonly known as: SEROQUEL Take 12.5 mg by mouth 2 (two) times daily.   vitamin B-12 1000 MCG tablet Commonly known as: CYANOCOBALAMIN Take 1,000 mcg by mouth daily.   warfarin 5 MG tablet Commonly known as: COUMADIN Take 5 mg by mouth every Monday, Tuesday, Wednesday, Thursday, and Friday.   warfarin 7.5 MG tablet Commonly known as: COUMADIN Take 7.5 mg by mouth See admin instructions. Saturday and Sunday       If you experience worsening of your admission symptoms, develop shortness of breath, life threatening emergency, suicidal or homicidal thoughts you must seek medical attention  immediately by calling 911 or calling your MD immediately  if symptoms less severe.  You Must read complete instructions/literature along with all the possible adverse reactions/side effects for all the Medicines you take and that have been prescribed to you. Take any new Medicines after you have completely understood and accept all the possible adverse reactions/side effects.   Please note  You were cared for by a hospitalist during your hospital stay. If you have any questions about your discharge medications or the care you received while you were in the hospital after you are discharged, you can call the unit and asked to speak with the hospitalist on call if the hospitalist that took care of you is not available. Once you are discharged, your primary care physician will handle any further medical issues. Please note that NO REFILLS for any discharge medications will be authorized once you are discharged, as it is imperative that you return to your primary care physician (or establish a relationship with a primary care physician if you do not have one) for your aftercare needs so that they can reassess your need for medications and monitor your lab values. Today   SUBJECTIVE   Ambulated with PT this am. Confused at baseline. Does not communicate much No new issues per  RN  VITAL SIGNS:  Blood pressure (!) 164/91, pulse 77, temperature 98 F (36.7 C), temperature source Oral, resp. rate 20, height 5\' 3"  (1.6 m), weight 68 kg, SpO2 98 %.  I/O:    Intake/Output Summary (Last 24 hours) at 10/15/2019 1143 Last data filed at 10/15/2019 0900 Gross per 24 hour  Intake 598.1 ml  Output 0 ml  Net 598.1 ml    PHYSICAL EXAMINATION:  GENERAL:  86 y.o.-year-old patient lying in the bed with no acute distress.  EYES: Pupils equal, round, reactive to light and accommodation. No scleral icterus.  HEENT: Head atraumatic, normocephalic. Oropharynx and nasopharynx clear.   LUNGS: Normal breath sounds  bilaterally, no wheezing, rales,rhonchi or crepitation. No use of accessory muscles of respiration.  CARDIOVASCULAR: S1, S2 normal. No murmurs, rubs, or gallops.  ABDOMEN: Soft, non-tender, non-distended. Bowel sounds present. No organomegaly or mass.  EXTREMITIES: No pedal edema, cyanosis, or clubbing.  NEUROLOGIC: grossly nonfocal PSYCHIATRIC: The patient is alert. Has confusion at baseline due to dementia.  SKIN: No obvious rash, lesion, or ulcer.   DATA REVIEW:   CBC  Recent Labs  Lab 10/15/19 0432  WBC 7.2  HGB 11.4*  HCT 34.9*  PLT 217    Chemistries  Recent Labs  Lab 10/13/19 1338 10/13/19 1338 10/14/19 0400  NA 140  --   --   K 4.0  --   --   CL 105  --   --   CO2 22  --   --   GLUCOSE 115*  --   --   BUN 18  --   --   CREATININE 1.10*   < > 1.09*  CALCIUM 9.4  --   --   AST 27  --   --   ALT 15  --   --   ALKPHOS 70  --   --   BILITOT 1.5*  --   --    < > = values in this interval not displayed.    Microbiology Results   Recent Results (from the past 240 hour(s))  Urine culture     Status: None   Collection Time: 10/13/19  1:38 PM   Specimen: Urine, Random  Result Value Ref Range Status   Specimen Description   Final    URINE, RANDOM Performed at [facility], [address] [place], [place] [address]    Special Requests   Final    NONE Performed at [facility], [address] [place], [place] [address]    Culture   Final    NO GROWTH Performed at [facility], [place] [address], [place], [place] [address]    Report Status 10/14/2019 FINAL  Final  Respiratory Panel by RT PCR (Flu A&B, Covid) - Nasopharyngeal Swab     Status: None   Collection Time: 10/13/19  2:52 PM   Specimen: Nasopharyngeal Swab  Result Value Ref Range Status   SARS Coronavirus 2 by RT PCR NEGATIVE NEGATIVE Final    Comment: (NOTE) SARS-CoV-2 target nucleic acids are NOT DETECTED. The SARS-CoV-2 RNA is generally detectable in upper  respiratoy specimens during the acute phase of infection. The lowest concentration of SARS-CoV-2 viral copies this assay can detect is 131 copies/mL. A negative result does not preclude SARS-Cov-2 infection and should not be used as the sole basis for treatment or other patient management decisions. A negative result may occur with  improper specimen collection/handling, submission of specimen other than nasopharyngeal swab, presence of  viral mutation(s) within the areas targeted by this assay, and inadequate number of viral copies (<131 copies/mL). A negative result must be combined with clinical observations, patient history, and epidemiological information. The expected result is Negative. Fact Sheet for Patients:  PinkCheek.be Fact Sheet for Healthcare Providers:  GravelBags.it This test is not yet ap proved or cleared by the [place] FDA and  has been authorized for detection and/or diagnosis of SARS-CoV-2 by FDA under an Emergency Use Authorization (EUA). This EUA will remain  in effect (meaning this test can be used) for the duration of the COVID-19 declaration under Section 564(b)(1) of the Act, 21 U.S.C. section 360bbb-3(b)(1), unless the authorization is terminated or revoked sooner.    Influenza A by PCR NEGATIVE NEGATIVE Final   Influenza B by PCR NEGATIVE NEGATIVE Final    Comment: (NOTE) The Xpert Xpress SARS-CoV-2/FLU/RSV assay is intended as an aid in  the diagnosis of influenza from Nasopharyngeal swab specimens and  should not be used as a sole basis for treatment. Nasal washings and  aspirates are unacceptable for Xpert Xpress SARS-CoV-2/FLU/RSV  testing. Fact Sheet for Patients: PinkCheek.be Fact Sheet for Healthcare Providers: GravelBags.it This test is not yet approved or cleared by the [place] FDA and  has been authorized for  detection and/or diagnosis of SARS-CoV-2 by  FDA under an Emergency Use Authorization (EUA). This EUA will remain  in effect (meaning this test can be used) for the duration of the  Covid-19 declaration under Section 564(b)(1) of the Act, 21  U.S.C. section 360bbb-3(b)(1), unless the authorization is  terminated or revoked. Performed at [facility], [address] [place], [place] [address]   CULTURE, BLOOD (ROUTINE X 2) w Reflex to ID Panel     Status: None (Preliminary result)   Collection Time: 10/13/19  6:31 PM   Specimen: BLOOD  Result Value Ref Range Status   Specimen Description BLOOD BLOOD RIGHT HAND  Final   Special Requests   Final    BOTTLES DRAWN AEROBIC AND ANAEROBIC Blood Culture adequate volume   Culture   Final    NO GROWTH 2 DAYS Performed at [facility], [address], [place], [place] [address]    Report Status PENDING  Incomplete  CULTURE, BLOOD (ROUTINE X 2) w Reflex to ID Panel     Status: None (Preliminary result)   Collection Time: 10/13/19  6:39 PM   Specimen: BLOOD  Result Value Ref Range Status   Specimen Description BLOOD RIGHT ANTECUBITAL  Final   Special Requests   Final    BOTTLES DRAWN AEROBIC AND ANAEROBIC Blood Culture adequate volume   Culture   Final    NO GROWTH 2 DAYS Performed at [facility], [address], [place], [place] [address]    Report Status PENDING  Incomplete  MRSA PCR Screening     Status: None   Collection Time: 10/13/19  7:02 PM   Specimen: Nasal Mucosa; Nasopharyngeal  Result Value Ref Range Status   MRSA by PCR NEGATIVE NEGATIVE Final    Comment:        The GeneXpert MRSA Assay (FDA approved for NASAL specimens only), is one component of a comprehensive MRSA colonization surveillance program. It is not intended to diagnose MRSA infection nor to guide or monitor treatment for MRSA infections. Performed at [facility], [address] [place],  [address]      RADIOLOGY:  CT HEAD WO CONTRAST  Result Date: 10/13/2019 CLINICAL DATA:  Altered mental status.  Dementia. EXAM: CT HEAD WITHOUT CONTRAST TECHNIQUE: Contiguous axial images were obtained from the base of the skull through the vertex without intravenous contrast. COMPARISON:  10/11/2019 FINDINGS: Brain: No evidence of acute infarction, hemorrhage, extra-axial collection, ventriculomegaly, or mass effect. Generalized cerebral atrophy. Periventricular white matter low attenuation likely secondary to microangiopathy. Vascular: Cerebrovascular atherosclerotic calcifications are noted. Skull: Negative for fracture or focal lesion. Sinuses/Orbits: Visualized portions of the orbits are unremarkable. Visualized portions of the paranasal sinuses are unremarkable. Visualized portions of the mastoid air cells are unremarkable. Other: None. IMPRESSION: 1. No acute intracranial pathology. 2. Chronic microvascular disease and cerebral atrophy. Electronically Signed   By: Kathreen Devoid   On: 10/13/2019 14:51   DG Chest Port 1 View  Result Date: 10/13/2019 CLINICAL DATA:  Tachypnea. EXAM: PORTABLE CHEST 1 VIEW COMPARISON:  November 09, 2015. FINDINGS: Stable cardiomediastinal silhouette. Left-sided pacemaker is unchanged in position. No pneumothorax or pleural effusion is noted. Mild multifocal opacities are noted in the right lung concerning for pneumonia. Bony thorax is unremarkable. IMPRESSION: Mild multifocal right lung opacities are noted concerning for pneumonia. Electronically Signed   By: Marijo Conception M.D.   On: 10/13/2019 14:14     CODE STATUS:     Code Status Orders  (From admission, onward)         Start     Ordered   10/13/19 1625  Do not attempt resuscitation (DNR)  Continuous    Question Answer Comment  In the event of cardiac or respiratory ARREST Do not call a "code blue"   In the event of cardiac or respiratory ARREST Do not perform Intubation, CPR, defibrillation or ACLS   In the event  of cardiac or respiratory ARREST Use medication by any route, position, wound care, and other measures to relive pain and suffering. May use oxygen, suction and manual treatment of airway obstruction as needed for comfort.      10/13/19 1625        Code Status History    Date Active Date Inactive Code Status Order ID Comments User Context   11/09/2015 1715 11/11/2015 1512 Full Code [id number]  Demetrios Loll, MD Inpatient   02/10/2015 1717 02/16/2015 1042 Full Code [id number]  Erline Levine, MD Inpatient   Advance Care Planning Activity    Advance Directive Documentation     Most Recent Value  Type of Advance Directive  Healthcare Power of [place], Living will  Pre-existing out of facility DNR order (yellow form or pink MOST form)  --  "MOST" Form in Place?  --       TOTAL TIME TAKING CARE OF THIS PATIENT: *40* minutes.    Fritzi Mandes M.D  [facility]    CC: Primary care physician; Tracie Harrier, MD

## 2019-10-15 NOTE — Evaluation (Signed)
Physical Therapy Evaluation Patient Details Name: Sally Carroll MRN: [id number] DOB: Apr 19, 1934 Today's Date: 10/15/2019   History of Present Illness  86 y.o. female with medical history significant of dementia, hypertension, pacemaker placed, atrial fibrillation on Coumadin, GERD, hypothyroidism, depression with anxiety, breast cancer, anemia, CKD-3a, who presents with altered mental status.  Pt with baseline dementia (at memory care unit) but is apparently more conversive and able to ambulate at baseline.  Clinical Impression  Pt with considerable confusion t/o session and needed repeated cuing and encouragement to get her to initiate movement/follow instructions t/o PT exam and gait training.  Pt able to briefly make minimal adjustments with posture/stepping with direct hands-on cuing but unable to maintain at all and needed constant cuing though she did not have any overt LOBs or safety issues apart from general lack of awareness.  Pt would benefit from working with PT on return to memory care unit to improve independence and safety.    Follow Up Recommendations Home health PT    Equipment Recommendations  Other (comment)(pt very likely has a walker, would need one if not?)    Recommendations for Other Services       Precautions / Restrictions Precautions Precautions: Fall Restrictions Weight Bearing Restrictions: No      Mobility  Bed Mobility Overal bed mobility: Modified Independent Bed Mobility: Supine to Sit     Supine to sit: Min assist     General bed mobility comments: Pt struggled to initiate movement, had mental status been appropriately she likely could have done the transitions w/o assist, however she did need cuing and phyiscal encouragement getting in and out of bed  Transfers Overall transfer level: Modified independent Equipment used: Rolling walker (2 wheeled)             General transfer comment: Once pt understood the request she was able to  rise w/o assist, however she again struggled to initiate movement or show good awareness with transitions  Ambulation/Gait Ambulation/Gait assistance: Min guard Gait Distance (Feet): 100 Feet Assistive device: Rolling walker (2 wheeled)       General Gait Details: Pt with slow gait and stooped posture but able to maintain relatively consistent cadence/ambulation.  She showed good effort but was unable to make adjustments related cuing from PT   Stairs            Wheelchair Mobility    Modified Rankin (Stroke Patients Only)       Balance Overall balance assessment: Needs assistance Sitting-balance support: No upper extremity supported Sitting balance-Leahy Scale: Good Sitting balance - Comments: Pt able to maintain sitting at EOB w/o issue   Standing balance support: Bilateral upper extremity supported Standing balance-Leahy Scale: Fair Standing balance comment: forward lean on the walker, unable to attain upright posture - limited by comprehension and therefore reliant on the walker                             Pertinent Vitals/Pain Pain Assessment: No/denies pain    Home Living Family/patient expects to be discharged to:: Assisted living               Home Equipment: (unsure, seems that she does use walker at baseline?) Additional Comments: memory care unit    Prior Function Level of Independence: Independent               Hand Dominance        Extremity/Trunk Assessment  Upper Extremity Assessment Upper Extremity Assessment: Generalized weakness    Lower Extremity Assessment Lower Extremity Assessment: Generalized weakness       Communication   Communication: HOH;Expressive difficulties  Cognition Arousal/Alertness: Awake/alert Behavior During Therapy: Flat affect Overall Cognitive Status: History of cognitive impairments - at baseline                                 General Comments: unsure of true  baseline, dementia present       General Comments      Exercises     Assessment/Plan    PT Assessment Patient needs continued PT services  PT Problem List Decreased strength;Decreased range of motion;Decreased activity tolerance;Decreased balance;Decreased mobility;Decreased coordination;Decreased cognition;Decreased knowledge of use of DME;Decreased safety awareness;Decreased knowledge of precautions       PT Treatment Interventions DME instruction;Gait training;Functional mobility training;Therapeutic activities;Therapeutic exercise;Balance training;Neuromuscular re-education;Cognitive remediation;Patient/family education    PT Goals (Current goals can be found in the Care Plan section)  Acute Rehab PT Goals Patient Stated Goal: unable to state PT Goal Formulation: With patient Time For Goal Achievement: 10/29/19 Potential to Achieve Goals: Fair    Frequency Min 2X/week   Barriers to discharge        Co-evaluation               AM-PAC PT "6 Clicks" Mobility  Outcome Measure Help needed turning from your back to your side while in a flat bed without using bedrails?: A Little Help needed moving from lying on your back to sitting on the side of a flat bed without using bedrails?: A Little Help needed moving to and from a bed to a chair (including a wheelchair)?: A Little Help needed standing up from a chair using your arms (e.g., wheelchair or bedside chair)?: A Little Help needed to walk in hospital room?: A Little Help needed climbing 3-5 steps with a railing? : A Little 6 Click Score: 18    End of Session Equipment Utilized During Treatment: Gait belt Activity Tolerance: Patient tolerated treatment well Patient left: with bed alarm set;with call bell/phone within reach Nurse Communication: Mobility status PT Visit Diagnosis: Muscle weakness (generalized) (M62.81);Difficulty in walking, not elsewhere classified (R26.2)    Time: [id number] PT Time Calculation  (min) (ACUTE ONLY): 22 min   Charges:   PT Evaluation $PT Eval Low Complexity: 1 Low PT Treatments $Gait Training: 8-22 mins        Kreg Shropshire, DPT 10/15/2019, 10:43 AM

## 2019-10-15 NOTE — TOC Progression Note (Signed)
Transition of Care [facility]) - Progression Note    Patient Details  Name: Sally Carroll MRN: [id number] Date of Birth: February 07, 1934  Transition of Care [facility]) CM/SW Contact  Kyshaun Barnette, Gardiner Rhyme, LCSW Phone Number: 10/15/2019, 10:40 AM  Clinical Narrative:  Spoke with MD who reports pt can return to [place] today. Have contacted [facility] to inform her she is asking for a new COVID test prior to re-admitting her to their facility. Made bedside RN aware and will work on [facility] and paperwork to get back to facility. They want her transported non-emergency EMS. Will contact sister and inform her of the plan. Informed pt who wants to go back.    Expected Discharge Plan: Memory Care Barriers to Discharge: Continued Medical Work up  Expected Discharge Plan and Services Expected Discharge Plan: Memory Care In-house Referral: Clinical Social Work     Living arrangements for the past 2 months: [place]                                       Social Determinants of Health (SDOH) Interventions    Readmission Risk Interventions No flowsheet data found.

## 2019-10-15 NOTE — Plan of Care (Signed)

## 2019-10-15 NOTE — Progress Notes (Signed)
Pt ready for [facility] report called earlier to bonnie.

## 2019-10-15 NOTE — TOC Transition Note (Signed)
Transition of Care [facility]) - CM/SW Discharge Note   Patient Details  Name: Sally Carroll MRN: [id number] Date of Birth: 06-21-1934  Transition [facility]) CM/SW Contact:  Elease Hashimoto, LCSW Phone Number: 10/15/2019, 1:22 PM   Clinical Narrative:   Paperwork complete and pt ready to return to [facility]. Have informed [place] of return and have faxed paperwork-FL2 and DC summary, along with negative COVID test. Bedside RN aware of pt being ready and to contact EMS once she is ready to transfer pt back to facility.  Packet in chart. Bedside RN to call report [phone]. [name]-sister is aware of her return.     Final next level of care: Memory Care Barriers to Discharge: Barriers Resolved   Patient Goals and CMS Choice Patient states their goals for this hospitalization and ongoing recovery are:: Go back home      Discharge Placement              Patient chooses bed at: Other - please specify in the comment section below:([facility]) Patient to be transferred to facility by: EMS Name of family member notified: Anderson Malta at [facility] [place] Patient and family notified of of transfer: 10/15/19  Discharge Plan and Services In-house Referral: Clinical Social Work                                   Social Determinants of Health ([place]) Interventions     Readmission Risk Interventions No flowsheet data found.

## 2019-10-15 NOTE — Progress Notes (Signed)
  Speech Language Pathology Treatment: Dysphagia  Patient Details Name: Sally Carroll MRN: [id number] DOB: Aug 30, 1934 Today's Date: 10/15/2019 Time: 1230-1300 SLP Time Calculation (min) (ACUTE ONLY): 30 min  Assessment / Plan / Recommendation Clinical Impression  Pt seen for ongoing assessment and toleration of oral diet; recommended diet consistency of Dysphagia level 1(puree) w/ thin liquids. No reports of difficulty w/ the recommended diet per NSG notes. Pt consumed po trials w/ SLP during session w/ no overt clinical s/s of aspiration noted; vocal quality clear b/t trials, no decline in respiratory effort during/post trials. Pt helped to feed self holding Cup to drink -- visual/verbal/tactile cues given to support and guide pt during tasks secondary to the Cognitive decline(baseline for pt).  Recommend pt continue this diet consistency for meeting nutrition/hydration needs safely -- most beneficial in light of pt's advanced Cognitive decline, Dementia. Pt requires Supervision at meals for encouragement w/ oral intake; will benefit from Dietician f/u for Drink Supplement.  Recommend a Dysphagia level 1 (puree) w/ Thin liquids; general aspiration precautions; Pills in Puree - Crushed as able for safer swallowing. Reduce distractions at meal time. Oral care post meals. Dietician f/u. NSG updated.      HPI HPI: Patient is a 86 y.o. female with medical history significant of Dementia, hypertension, pacemaker placed, atrial fibrillation on Coumadin, GERD, hypothyroidism, depression with anxiety, breast cancer, anemia, CKD-3a, who presents with altered mental status.  Patient came in from memory care unit at [facility] with increasing confusion not her usual self.  CXR revealed: "Mild multifocal right lung opacities are noted", however, pt has not had any other Pulmonary assessment or CXR since 2017.       SLP Plan  All goals met       Recommendations  Diet recommendations: Dysphagia 1  (puree);Thin liquid Liquids provided via: Cup;Straw(monitor) Medication Administration: Crushed with puree(crushed in puree) Supervision: Patient able to self feed;Staff to assist with self feeding;Intermittent supervision to cue for compensatory strategies Compensations: Minimize environmental distractions;Slow rate;Small sips/bites;Lingual sweep for clearance of pocketing;Multiple dry swallows after each bite/sip;Follow solids with liquid Postural Changes and/or Swallow Maneuvers: Seated upright 90 degrees;Upright 30-60 min after meal                General recommendations: (Dietician f/u) Oral Care Recommendations: Oral care BID;Oral care before and after PO;Staff/trained caregiver to provide oral care Follow up Recommendations: None SLP Visit Diagnosis: Dysphagia, oral phase (R13.11)(baseline Dementia) Plan: All goals met       GO                 Orinda Kenner, MS, CCC-SLP Gerard Bonus 10/15/2019, 4:18 PM

## 2019-10-15 NOTE — Consult Note (Signed)
ANTICOAGULATION CONSULT NOTE  Pharmacy Consult for warfarin dosing Indication: atrial fibrillation  Patient Measurements: Height: 5\' 3"  (160 cm) Weight: (64.6) IBW/kg (Calculated) : 52.4  Vital Signs: Temp: 98.6 F (37 C) (02/12 0046) Temp Source: Oral (02/12 0046) BP: 158/76 (02/12 0046) Pulse Rate: 77 (02/12 0046)  Labs: Recent Labs    10/13/19 1338 10/14/19 0400 10/14/19 0835 10/15/19 0432  HGB 11.9*  --   --  11.4*  HCT 36.2  --   --  34.9*  PLT 219  --   --  217  LABPROT 20.8*  --  24.2* 23.8*  INR 1.8*  --  2.2* 2.1*  CREATININE 1.10* 1.09*  --   --     Estimated Creatinine Clearance: 36.2 mL/min (A) (by C-G formula based on SCr of 1.09 mg/dL (H)).   Medical History: Past Medical History:  Diagnosis Date  . Anemia   . Anxiety   . Arthritis   . Breast cancer ([place]) 2011   rt- radiation  . Cancer [facility]) 2011   rt breast  . Depression   . Dysrhythmia    CHB s/p PPM, afib history  . GERD (gastroesophageal reflux disease)   . Heart murmur   . Hypertension   . Personal history of radiation therapy   . Presence of permanent cardiac pacemaker     Medications:  Scheduled:  . amLODipine  5 mg Oral Daily  . carvedilol  3.125 mg Oral BID WC  . citalopram  20 mg Oral Daily  . docusate sodium  100 mg Oral Daily  . donepezil  10 mg Oral QHS  . DULoxetine  20 mg Oral Daily  . feeding supplement (ENSURE ENLIVE)  237 mL Oral BID BM  . gabapentin  200 mg Oral TID  . ipratropium  0.5 mg Nebulization TID  . lamoTRIgine  25 mg Oral Daily  . levothyroxine  25 mcg Oral Q0600  . losartan  50 mg Oral Daily  . QUEtiapine  12.5 mg Oral BID  . vitamin B-12  1,000 mcg Oral Daily  . Warfarin - Pharmacist Dosing Inpatient   Does not apply q1800    Assessment:  86 y.o. female with medical history significant of dementia, hypertension, pacemaker placed, atrial fibrillation on Coumadin, GERD, hypothyroidism, depression with anxiety, breast cancer, anemia, CKD-3a, who  presents with altered mental status. Home dose of warfarin is 5 mg daily except 7.5 mg Sa/Su. Baseline INR is 1.8, H&H slightly below normal limits but improved from baseline, platelets are wnl  DDIs: Synthroid (on PTA), antibiotics  2/10 INR 1.8  7.5 mg 2/11 INR 2.2  5 mg 2/12 INR 2.1   Goal of Therapy:  INR 2-3 Monitor platelets by anticoagulation protocol: Yes   Plan:   Warfarin 5 mg tonight  f/u INR in am  [facility] A Darrah Dredge 10/15/2019,7:41 AM

## 2019-10-15 NOTE — Care Management Important Message (Signed)
Important Message  Patient Details  Name: Sally Carroll MRN: [id number] Date of Birth: 03-15-1934   Medicare Important Message Given:  Yes     [name] 10/15/2019, 11:27 AM

## 2019-10-15 NOTE — NC FL2 (Signed)
[place] LEVEL OF CARE SCREENING TOOL     IDENTIFICATION  Patient Name: Sally Carroll Birthdate: [birth year] Sex: female Admission Date (Current Location): 10/13/2019  [place] and [place] Number:  Engineering geologist and Address:  [facility], [address] [place], [place] [address]      Provider Number: [id number]  Attending Physician Name and Address:  Fritzi Mandes, MD  Relative Name and Phone Number:  Winfield Rast [phone]    Current Level of Care: Hospital Recommended Level of Care: Memory Care Prior Approval Number:    Date Approved/Denied:   PASRR Number:    Discharge Plan: Other (Comment)(Memory Care)    Current Diagnoses: Patient Active Problem List   Diagnosis Date Noted  . HCAP (healthcare-associated pneumonia) 10/13/2019  . Acute metabolic encephalopathy [id number]  . Hypothyroid 10/13/2019  . CKD (chronic kidney disease), stage IIIa 10/13/2019  . Dementia ([place]) 10/13/2019  . Intermediate stage dry age-related macular degeneration of both eyes 07/01/2017  . Pterygium eye, left 04/02/2017  . Late onset Alzheimer's disease with behavioral disturbance ([place]) 11/01/2016  . Rotator cuff tendinitis, left 07/29/2016  . Lumbar spondylosis 05/27/2016  . Pneumonia 11/09/2015  . Herniated lumbar intervertebral disc 02/10/2015  . 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 Lumbar radiculitis 11/11/2014  . Primary osteoarthritis of right hip 10/24/2014  . Absolute anemia 01/14/2014  . Long term (current) use of anticoagulants 12/31/2013  . NSVT (nonsustained ventricular tachycardia) ([place]) 09/04/2011  . Anxiety and depression 08/29/2011  . A-fib ([place]) 08/29/2011  . Breast CA ([place]) 08/29/2011  . Artificial cardiac pacemaker 08/29/2011  . Acquired complete AV block ([place]) 08/29/2011  . Hyperlipidemia, unspecified 08/29/2011  . Hypertension 08/29/2011  . Restless  leg syndrome 08/29/2011    Orientation RESPIRATION BLADDER Height & Weight     Self, Place  Normal External catheter Weight: (64.6) Height:  5\' 3"  (160 cm)  BEHAVIORAL SYMPTOMS/MOOD NEUROLOGICAL BOWEL NUTRITION STATUS      Continent Diet(Dys 1-pureed with thin liquids)  AMBULATORY STATUS COMMUNICATION OF NEEDS Skin   Supervision Verbally Normal                       Personal Care Assistance Level of Assistance  Bathing, Feeding, Dressing Bathing Assistance: Limited assistance Feeding assistance: Limited assistance(supervision when eating) Dressing Assistance: Limited assistance     Functional Limitations Info  Speech     Speech Info: Impaired    SPECIAL CARE FACTORS FREQUENCY  PT (By licensed PT), Speech therapy     PT Frequency: 3x week       Speech Therapy Frequency: 2-3 x week      Contractures Contractures Info: Not present    Additional Factors Info  Code Status, Allergies Code Status Info: DNR Allergies Info: Naproxwn, Sucralfate, prednisone           Current Medications (10/15/2019):  This is the current hospital active medication list Current Facility-Administered Medications  Medication Dose Route Frequency Provider Last Rate Last Admin  . 0.9 %  sodium chloride infusion   Intravenous PRN Ivor Costa, MD 10 mL/hr at 10/13/19 2244 Restarted at 10/13/19 2244  . acetaminophen (TYLENOL) tablet 650 mg  650 mg Oral Q6H PRN Ivor Costa, MD      . albuterol (PROVENTIL) (2.5 MG/3ML) 0.083% nebulizer solution 3 mL  3 mL Inhalation Q4H PRN Ivor Costa, MD      . amLODipine (NORVASC) tablet 5 mg  5  mg Oral Daily Ivor Costa, MD   5 mg at 10/15/19 1042  . amoxicillin-clavulanate (AUGMENTIN) 875-125 MG per tablet 1 tablet  1 tablet Oral Q12H Fritzi Mandes, MD      . carvedilol (COREG) tablet 3.125 mg  3.125 mg Oral BID WC Ivor Costa, MD   3.125 mg at 10/15/19 1043  . citalopram (CELEXA) tablet 20 mg  20 mg Oral Daily Ivor Costa, MD   20 mg at 10/15/19 1042  .  docusate sodium (COLACE) capsule 100 mg  100 mg Oral Daily Ivor Costa, MD   100 mg at 10/15/19 1042  . donepezil (ARICEPT) tablet 10 mg  10 mg Oral QHS Ivor Costa, MD   10 mg at 10/14/19 2059  . DULoxetine (CYMBALTA) DR capsule 20 mg  20 mg Oral Daily Ivor Costa, MD   20 mg at 10/15/19 1042  . feeding supplement (ENSURE ENLIVE) (ENSURE ENLIVE) liquid 237 mL  237 mL Oral BID BM Fritzi Mandes, MD   237 mL at 10/14/19 1615  . gabapentin (NEURONTIN) capsule 200 mg  200 mg Oral TID Ivor Costa, MD   200 mg at 10/15/19 1042  . guaiFENesin (MUCINEX) 12 hr tablet 600 mg  600 mg Oral BID PRN Ivor Costa, MD   600 mg at 10/15/19 1042  . hydrALAZINE (APRESOLINE) injection 10 mg  10 mg Intravenous Q6H PRN Fritzi Mandes, MD   10 mg at 10/14/19 1619  . ipratropium (ATROVENT) nebulizer solution 0.5 mg  0.5 mg Nebulization TID Ivor Costa, MD   0.5 mg at 10/15/19 0819  . lamoTRIgine (LAMICTAL) tablet 25 mg  25 mg Oral Daily Ivor Costa, MD   25 mg at 10/15/19 1043  . levothyroxine (SYNTHROID) tablet 25 mcg  25 mcg Oral Q0600 Ivor Costa, MD   25 mcg at 10/14/19 0531  . LORazepam (ATIVAN) tablet 0.5 mg  0.5 mg Oral QHS PRN Ivor Costa, MD      . losartan (COZAAR) tablet 50 mg  50 mg Oral Daily Ivor Costa, MD   50 mg at 10/15/19 1042  . ondansetron (ZOFRAN) injection 4 mg  4 mg Intravenous Q8H PRN Ivor Costa, MD      . QUEtiapine (SEROQUEL) tablet 12.5 mg  12.5 mg Oral BID Ivor Costa, MD   12.5 mg at 10/15/19 1043  . vitamin B-12 (CYANOCOBALAMIN) tablet 1,000 mcg  1,000 mcg Oral Daily Ivor Costa, MD   1,000 mcg at 10/15/19 1042  . warfarin (COUMADIN) tablet 5 mg  5 mg Oral ONCE-1800 Nazari, Walid A, RPH      . Warfarin - Pharmacist Dosing Inpatient   Does not apply q1800 Dallie Piles, [facility]         Discharge Medications:  Medication List    TAKE these medications   acetaminophen 325 MG tablet Commonly known as: TYLENOL Take 650 mg by mouth 2 (two) times daily as needed for mild pain or moderate pain. What changed:  Another medication with the same name was removed. Continue taking this medication, and follow the directions you see here.   amoxicillin-clavulanate 875-125 MG tablet Commonly known as: AUGMENTIN Take 1 tablet by mouth every 12 (twelve) hours for 5 days.   carvedilol 6.25 MG tablet Commonly known as: COREG Take 1 tablet (6.25 mg total) by mouth 2 (two) times daily with a meal. What changed: how much to take   citalopram 20 MG tablet Commonly known as: CELEXA Take 20 mg by mouth daily.   docusate sodium 100 MG  capsule Commonly known as: COLACE Take 100 mg by mouth daily.   donepezil 10 MG tablet Commonly known as: ARICEPT Take 10 mg by mouth at bedtime.   DULoxetine 20 MG capsule Commonly known as: CYMBALTA Take 20 mg by mouth daily.   feeding supplement (ENSURE ENLIVE) Liqd Take 237 mLs by mouth 2 (two) times daily between meals.   gabapentin 100 MG capsule Commonly known as: NEURONTIN Take 200 mg by mouth 3 (three) times daily.   guaiFENesin 600 MG 12 hr tablet Commonly known as: MUCINEX Take 600 mg by mouth 2 (two) times daily as needed for to loosen phlegm.   lamoTRIgine 25 MG tablet Commonly known as: LAMICTAL Take 25 mg by mouth daily.   levothyroxine 25 MCG tablet Commonly known as: SYNTHROID Take 25 mcg by mouth daily before breakfast.   LORazepam 0.5 MG tablet Commonly known as: ATIVAN Take 0.5 mg by mouth at bedtime as needed (agitation). What changed: Another medication with the same name was removed. Continue taking this medication, and follow the directions you see here.   losartan 50 MG tablet Commonly known as: COZAAR Take 50 mg by mouth daily.   QUEtiapine 25 MG tablet Commonly known as: SEROQUEL Take 12.5 mg by mouth 2 (two) times daily.   vitamin B-12 1000 MCG tablet Commonly known as: CYANOCOBALAMIN Take 1,000 mcg by mouth daily.   warfarin 5 MG tablet Commonly known as: COUMADIN Take 5 mg by mouth every Monday,  Tuesday, Wednesday, Thursday, and Friday.   warfarin 7.5 MG tablet Commonly known as: COUMADIN Take 7.5 mg by mouth See admin instructions. Saturday and Sunday       Medication List    TAKE these medications   acetaminophen 325 MG tablet Commonly known as: TYLENOL Take 650 mg by mouth 2 (two) times daily as needed for mild pain or moderate pain. What changed: Another medication with the same name was removed. Continue taking this medication, and follow the directions you see here.   amoxicillin-clavulanate 875-125 MG tablet Commonly known as: AUGMENTIN Take 1 tablet by mouth every 12 (twelve) hours for 5 days.   carvedilol 6.25 MG tablet Commonly known as: COREG Take 1 tablet (6.25 mg total) by mouth 2 (two) times daily with a meal. What changed: how much to take   citalopram 20 MG tablet Commonly known as: CELEXA Take 20 mg by mouth daily.   docusate sodium 100 MG capsule Commonly known as: COLACE Take 100 mg by mouth daily.   donepezil 10 MG tablet Commonly known as: ARICEPT Take 10 mg by mouth at bedtime.   DULoxetine 20 MG capsule Commonly known as: CYMBALTA Take 20 mg by mouth daily.   feeding supplement (ENSURE ENLIVE) Liqd Take 237 mLs by mouth 2 (two) times daily between meals.   gabapentin 100 MG capsule Commonly known as: NEURONTIN Take 200 mg by mouth 3 (three) times daily.   guaiFENesin 600 MG 12 hr tablet Commonly known as: MUCINEX Take 600 mg by mouth 2 (two) times daily as needed for to loosen phlegm.   lamoTRIgine 25 MG tablet Commonly known as: LAMICTAL Take 25 mg by mouth daily.   levothyroxine 25 MCG tablet Commonly known as: SYNTHROID Take 25 mcg by mouth daily before breakfast.   LORazepam 0.5 MG tablet Commonly known as: ATIVAN Take 0.5 mg by mouth at bedtime as needed (agitation). What changed: Another medication with the same name was removed. Continue taking this medication, and follow the directions you see  here.  losartan 50 MG tablet Commonly known as: COZAAR Take 50 mg by mouth daily.   QUEtiapine 25 MG tablet Commonly known as: SEROQUEL Take 12.5 mg by mouth 2 (two) times daily.   vitamin B-12 1000 MCG tablet Commonly known as: CYANOCOBALAMIN Take 1,000 mcg by mouth daily.   warfarin 5 MG tablet Commonly known as: COUMADIN Take 5 mg by mouth every Monday, Tuesday, Wednesday, Thursday, and Friday.   warfarin 7.5 MG tablet Commonly known as: COUMADIN Take 7.5 mg by mouth See admin instructions. Saturday and Sunday          Relevant Imaging Results:  Relevant Lab Results:   Additional Information [id number]  Agustin Swatek, Gardiner Rhyme, LCSW

## 2019-10-16 LAB — LEGIONELLA PNEUMOPHILA SEROGP 1 UR AG: L. pneumophila Serogp 1 Ur Ag: NEGATIVE

## 2019-10-18 DIAGNOSIS — Z79899 Other long term (current) drug therapy: Secondary | ICD-10-CM | POA: Diagnosis not present

## 2019-10-18 DIAGNOSIS — R1311 Dysphagia, oral phase: Secondary | ICD-10-CM | POA: Diagnosis not present

## 2019-10-18 DIAGNOSIS — I4819 Other persistent atrial fibrillation: Secondary | ICD-10-CM | POA: Diagnosis not present

## 2019-10-18 DIAGNOSIS — R269 Unspecified abnormalities of gait and mobility: Secondary | ICD-10-CM | POA: Diagnosis not present

## 2019-10-18 DIAGNOSIS — J690 Pneumonitis due to inhalation of food and vomit: Secondary | ICD-10-CM | POA: Diagnosis not present

## 2019-10-18 DIAGNOSIS — G9341 Metabolic encephalopathy: Secondary | ICD-10-CM | POA: Diagnosis not present

## 2019-10-18 DIAGNOSIS — F0390 Unspecified dementia without behavioral disturbance: Secondary | ICD-10-CM | POA: Diagnosis not present

## 2019-10-18 LAB — CULTURE, BLOOD (ROUTINE X 2)
Culture: NO GROWTH
Culture: NO GROWTH
Special Requests: ADEQUATE
Special Requests: ADEQUATE

## 2019-10-22 DIAGNOSIS — Z20828 Contact with and (suspected) exposure to other viral communicable diseases: Secondary | ICD-10-CM | POA: Diagnosis not present

## 2019-10-23 DIAGNOSIS — Z20828 Contact with and (suspected) exposure to other viral communicable diseases: Secondary | ICD-10-CM | POA: Diagnosis not present

## 2019-10-26 DIAGNOSIS — R1311 Dysphagia, oral phase: Secondary | ICD-10-CM | POA: Diagnosis not present

## 2019-10-26 DIAGNOSIS — F0390 Unspecified dementia without behavioral disturbance: Secondary | ICD-10-CM | POA: Diagnosis not present

## 2019-10-26 DIAGNOSIS — J690 Pneumonitis due to inhalation of food and vomit: Secondary | ICD-10-CM | POA: Diagnosis not present

## 2019-10-26 DIAGNOSIS — G9341 Metabolic encephalopathy: Secondary | ICD-10-CM | POA: Diagnosis not present

## 2019-10-26 DIAGNOSIS — R410 Disorientation, unspecified: Secondary | ICD-10-CM | POA: Diagnosis not present

## 2019-10-28 DIAGNOSIS — Z20828 Contact with and (suspected) exposure to other viral communicable diseases: Secondary | ICD-10-CM | POA: Diagnosis not present

## 2019-11-02 DIAGNOSIS — R1312 Dysphagia, oropharyngeal phase: Secondary | ICD-10-CM | POA: Diagnosis not present

## 2019-11-02 DIAGNOSIS — J690 Pneumonitis due to inhalation of food and vomit: Secondary | ICD-10-CM | POA: Diagnosis not present

## 2019-11-02 DIAGNOSIS — R1319 Other dysphagia: Secondary | ICD-10-CM | POA: Diagnosis not present

## 2019-11-02 DIAGNOSIS — G309 Alzheimer's disease, unspecified: Secondary | ICD-10-CM | POA: Diagnosis not present

## 2019-11-08 DIAGNOSIS — Z79899 Other long term (current) drug therapy: Secondary | ICD-10-CM | POA: Diagnosis not present

## 2019-11-08 DIAGNOSIS — B3789 Other sites of candidiasis: Secondary | ICD-10-CM | POA: Diagnosis not present

## 2019-11-08 DIAGNOSIS — I4819 Other persistent atrial fibrillation: Secondary | ICD-10-CM | POA: Diagnosis not present

## 2019-11-08 DIAGNOSIS — F0390 Unspecified dementia without behavioral disturbance: Secondary | ICD-10-CM | POA: Diagnosis not present

## 2019-11-08 DIAGNOSIS — I4891 Unspecified atrial fibrillation: Secondary | ICD-10-CM | POA: Diagnosis not present

## 2019-11-08 DIAGNOSIS — L603 Nail dystrophy: Secondary | ICD-10-CM | POA: Diagnosis not present

## 2019-11-08 DIAGNOSIS — F333 Major depressive disorder, recurrent, severe with psychotic symptoms: Secondary | ICD-10-CM | POA: Diagnosis not present

## 2019-11-09 DIAGNOSIS — R1319 Other dysphagia: Secondary | ICD-10-CM | POA: Diagnosis not present

## 2019-11-09 DIAGNOSIS — G309 Alzheimer's disease, unspecified: Secondary | ICD-10-CM | POA: Diagnosis not present

## 2019-11-09 DIAGNOSIS — R1312 Dysphagia, oropharyngeal phase: Secondary | ICD-10-CM | POA: Diagnosis not present

## 2019-11-09 DIAGNOSIS — J690 Pneumonitis due to inhalation of food and vomit: Secondary | ICD-10-CM | POA: Diagnosis not present

## 2019-11-15 DIAGNOSIS — L603 Nail dystrophy: Secondary | ICD-10-CM | POA: Diagnosis not present

## 2019-11-15 DIAGNOSIS — F333 Major depressive disorder, recurrent, severe with psychotic symptoms: Secondary | ICD-10-CM | POA: Diagnosis not present

## 2019-11-15 DIAGNOSIS — R1312 Dysphagia, oropharyngeal phase: Secondary | ICD-10-CM | POA: Diagnosis not present

## 2019-11-15 DIAGNOSIS — R1319 Other dysphagia: Secondary | ICD-10-CM | POA: Diagnosis not present

## 2019-11-15 DIAGNOSIS — F0390 Unspecified dementia without behavioral disturbance: Secondary | ICD-10-CM | POA: Diagnosis not present

## 2019-11-15 DIAGNOSIS — I4819 Other persistent atrial fibrillation: Secondary | ICD-10-CM | POA: Diagnosis not present

## 2019-11-15 DIAGNOSIS — J690 Pneumonitis due to inhalation of food and vomit: Secondary | ICD-10-CM | POA: Diagnosis not present

## 2019-11-15 DIAGNOSIS — S91209A Unspecified open wound of unspecified toe(s) with damage to nail, initial encounter: Secondary | ICD-10-CM | POA: Diagnosis not present

## 2019-11-15 DIAGNOSIS — G309 Alzheimer's disease, unspecified: Secondary | ICD-10-CM | POA: Diagnosis not present

## 2019-11-15 DIAGNOSIS — Z7901 Long term (current) use of anticoagulants: Secondary | ICD-10-CM | POA: Diagnosis not present

## 2019-11-22 DIAGNOSIS — F0390 Unspecified dementia without behavioral disturbance: Secondary | ICD-10-CM | POA: Diagnosis not present

## 2019-11-22 DIAGNOSIS — F333 Major depressive disorder, recurrent, severe with psychotic symptoms: Secondary | ICD-10-CM | POA: Diagnosis not present

## 2019-11-22 DIAGNOSIS — G4700 Insomnia, unspecified: Secondary | ICD-10-CM | POA: Diagnosis not present

## 2019-11-25 DIAGNOSIS — I4819 Other persistent atrial fibrillation: Secondary | ICD-10-CM | POA: Diagnosis not present

## 2019-11-25 DIAGNOSIS — Z79899 Other long term (current) drug therapy: Secondary | ICD-10-CM | POA: Diagnosis not present

## 2019-11-25 DIAGNOSIS — I482 Chronic atrial fibrillation, unspecified: Secondary | ICD-10-CM | POA: Diagnosis not present

## 2019-11-29 DIAGNOSIS — I4819 Other persistent atrial fibrillation: Secondary | ICD-10-CM | POA: Diagnosis not present

## 2019-11-29 DIAGNOSIS — Z79899 Other long term (current) drug therapy: Secondary | ICD-10-CM | POA: Diagnosis not present

## 2019-11-29 DIAGNOSIS — G4700 Insomnia, unspecified: Secondary | ICD-10-CM | POA: Diagnosis not present

## 2019-11-29 DIAGNOSIS — R358 Other polyuria: Secondary | ICD-10-CM | POA: Diagnosis not present

## 2019-12-13 DIAGNOSIS — R410 Disorientation, unspecified: Secondary | ICD-10-CM | POA: Diagnosis not present

## 2019-12-13 DIAGNOSIS — Z79899 Other long term (current) drug therapy: Secondary | ICD-10-CM | POA: Diagnosis not present

## 2019-12-13 DIAGNOSIS — F0390 Unspecified dementia without behavioral disturbance: Secondary | ICD-10-CM | POA: Diagnosis not present

## 2019-12-13 DIAGNOSIS — F333 Major depressive disorder, recurrent, severe with psychotic symptoms: Secondary | ICD-10-CM | POA: Diagnosis not present

## 2019-12-13 DIAGNOSIS — N3000 Acute cystitis without hematuria: Secondary | ICD-10-CM | POA: Diagnosis not present

## 2019-12-13 DIAGNOSIS — G4700 Insomnia, unspecified: Secondary | ICD-10-CM | POA: Diagnosis not present

## 2019-12-15 DIAGNOSIS — Z20828 Contact with and (suspected) exposure to other viral communicable diseases: Secondary | ICD-10-CM | POA: Diagnosis not present

## 2019-12-16 DIAGNOSIS — Z20828 Contact with and (suspected) exposure to other viral communicable diseases: Secondary | ICD-10-CM | POA: Diagnosis not present

## 2019-12-20 DIAGNOSIS — I4819 Other persistent atrial fibrillation: Secondary | ICD-10-CM | POA: Diagnosis not present

## 2019-12-20 DIAGNOSIS — N390 Urinary tract infection, site not specified: Secondary | ICD-10-CM | POA: Diagnosis not present

## 2019-12-20 DIAGNOSIS — I10 Essential (primary) hypertension: Secondary | ICD-10-CM | POA: Diagnosis not present

## 2019-12-20 DIAGNOSIS — F0390 Unspecified dementia without behavioral disturbance: Secondary | ICD-10-CM | POA: Diagnosis not present

## 2019-12-20 DIAGNOSIS — E039 Hypothyroidism, unspecified: Secondary | ICD-10-CM | POA: Diagnosis not present

## 2019-12-30 DIAGNOSIS — Z79899 Other long term (current) drug therapy: Secondary | ICD-10-CM | POA: Diagnosis not present

## 2019-12-30 DIAGNOSIS — N390 Urinary tract infection, site not specified: Secondary | ICD-10-CM | POA: Diagnosis not present

## 2019-12-30 DIAGNOSIS — Z20828 Contact with and (suspected) exposure to other viral communicable diseases: Secondary | ICD-10-CM | POA: Diagnosis not present

## 2020-01-03 DIAGNOSIS — I4819 Other persistent atrial fibrillation: Secondary | ICD-10-CM | POA: Diagnosis not present

## 2020-01-03 DIAGNOSIS — R634 Abnormal weight loss: Secondary | ICD-10-CM | POA: Diagnosis not present

## 2020-01-03 DIAGNOSIS — F0390 Unspecified dementia without behavioral disturbance: Secondary | ICD-10-CM | POA: Diagnosis not present

## 2020-01-03 DIAGNOSIS — N390 Urinary tract infection, site not specified: Secondary | ICD-10-CM | POA: Diagnosis not present

## 2020-01-06 DIAGNOSIS — Z20828 Contact with and (suspected) exposure to other viral communicable diseases: Secondary | ICD-10-CM | POA: Diagnosis not present

## 2020-01-13 DIAGNOSIS — I482 Chronic atrial fibrillation, unspecified: Secondary | ICD-10-CM | POA: Diagnosis not present

## 2020-01-17 DIAGNOSIS — Z79899 Other long term (current) drug therapy: Secondary | ICD-10-CM | POA: Diagnosis not present

## 2020-01-17 DIAGNOSIS — M47816 Spondylosis without myelopathy or radiculopathy, lumbar region: Secondary | ICD-10-CM | POA: Diagnosis not present

## 2020-01-17 DIAGNOSIS — I482 Chronic atrial fibrillation, unspecified: Secondary | ICD-10-CM | POA: Diagnosis not present

## 2020-01-17 DIAGNOSIS — N390 Urinary tract infection, site not specified: Secondary | ICD-10-CM | POA: Diagnosis not present

## 2020-01-17 DIAGNOSIS — G8929 Other chronic pain: Secondary | ICD-10-CM | POA: Diagnosis not present

## 2020-01-17 DIAGNOSIS — Z7901 Long term (current) use of anticoagulants: Secondary | ICD-10-CM | POA: Diagnosis not present

## 2020-01-17 DIAGNOSIS — M545 Low back pain: Secondary | ICD-10-CM | POA: Diagnosis not present

## 2020-01-17 DIAGNOSIS — F333 Major depressive disorder, recurrent, severe with psychotic symptoms: Secondary | ICD-10-CM | POA: Diagnosis not present

## 2020-01-19 DIAGNOSIS — R2681 Unsteadiness on feet: Secondary | ICD-10-CM | POA: Diagnosis not present

## 2020-01-19 DIAGNOSIS — G308 Other Alzheimer's disease: Secondary | ICD-10-CM | POA: Diagnosis not present

## 2020-01-19 DIAGNOSIS — M6281 Muscle weakness (generalized): Secondary | ICD-10-CM | POA: Diagnosis not present

## 2020-01-19 DIAGNOSIS — Z9181 History of falling: Secondary | ICD-10-CM | POA: Diagnosis not present

## 2020-01-19 DIAGNOSIS — M150 Primary generalized (osteo)arthritis: Secondary | ICD-10-CM | POA: Diagnosis not present

## 2020-01-19 DIAGNOSIS — R2689 Other abnormalities of gait and mobility: Secondary | ICD-10-CM | POA: Diagnosis not present

## 2020-01-19 DIAGNOSIS — N390 Urinary tract infection, site not specified: Secondary | ICD-10-CM | POA: Diagnosis not present

## 2020-01-21 DIAGNOSIS — Z9181 History of falling: Secondary | ICD-10-CM | POA: Diagnosis not present

## 2020-01-21 DIAGNOSIS — M150 Primary generalized (osteo)arthritis: Secondary | ICD-10-CM | POA: Diagnosis not present

## 2020-01-21 DIAGNOSIS — R2681 Unsteadiness on feet: Secondary | ICD-10-CM | POA: Diagnosis not present

## 2020-01-21 DIAGNOSIS — G308 Other Alzheimer's disease: Secondary | ICD-10-CM | POA: Diagnosis not present

## 2020-01-21 DIAGNOSIS — R2689 Other abnormalities of gait and mobility: Secondary | ICD-10-CM | POA: Diagnosis not present

## 2020-01-21 DIAGNOSIS — M6281 Muscle weakness (generalized): Secondary | ICD-10-CM | POA: Diagnosis not present

## 2020-01-21 DIAGNOSIS — N390 Urinary tract infection, site not specified: Secondary | ICD-10-CM | POA: Diagnosis not present

## 2020-01-22 ENCOUNTER — Inpatient Hospital Stay
Admission: EM | Admit: 2020-01-22 | Discharge: 2020-01-25 | DRG: 291 | Disposition: A | Discharge status: Hospice | Payer: Medicare HMO | Attending: Internal Medicine | Admitting: Internal Medicine

## 2020-01-22 ENCOUNTER — Emergency Department: Payer: Medicare HMO

## 2020-01-22 ENCOUNTER — Observation Stay: Payer: Medicare HMO

## 2020-01-22 ENCOUNTER — Other Ambulatory Visit: Payer: Self-pay

## 2020-01-22 DIAGNOSIS — Z79899 Other long term (current) drug therapy: Secondary | ICD-10-CM

## 2020-01-22 DIAGNOSIS — I130 Hypertensive heart and chronic kidney disease with heart failure and stage 1 through stage 4 chronic kidney disease, or unspecified chronic kidney disease: Principal | ICD-10-CM | POA: Diagnosis present

## 2020-01-22 DIAGNOSIS — M1990 Unspecified osteoarthritis, unspecified site: Secondary | ICD-10-CM | POA: Diagnosis present

## 2020-01-22 DIAGNOSIS — F329 Major depressive disorder, single episode, unspecified: Secondary | ICD-10-CM | POA: Diagnosis not present

## 2020-01-22 DIAGNOSIS — J9621 Acute and chronic respiratory failure with hypoxia: Secondary | ICD-10-CM | POA: Diagnosis not present

## 2020-01-22 DIAGNOSIS — E876 Hypokalemia: Secondary | ICD-10-CM | POA: Diagnosis present

## 2020-01-22 DIAGNOSIS — R0902 Hypoxemia: Secondary | ICD-10-CM | POA: Diagnosis not present

## 2020-01-22 DIAGNOSIS — Z79890 Hormone replacement therapy (postmenopausal): Secondary | ICD-10-CM

## 2020-01-22 DIAGNOSIS — I5043 Acute on chronic combined systolic (congestive) and diastolic (congestive) heart failure: Secondary | ICD-10-CM | POA: Diagnosis not present

## 2020-01-22 DIAGNOSIS — E039 Hypothyroidism, unspecified: Secondary | ICD-10-CM | POA: Diagnosis present

## 2020-01-22 DIAGNOSIS — I442 Atrioventricular block, complete: Secondary | ICD-10-CM | POA: Diagnosis present

## 2020-01-22 DIAGNOSIS — I5040 Unspecified combined systolic (congestive) and diastolic (congestive) heart failure: Secondary | ICD-10-CM | POA: Diagnosis not present

## 2020-01-22 DIAGNOSIS — Z923 Personal history of irradiation: Secondary | ICD-10-CM

## 2020-01-22 DIAGNOSIS — M2550 Pain in unspecified joint: Secondary | ICD-10-CM | POA: Diagnosis not present

## 2020-01-22 DIAGNOSIS — N1831 Chronic kidney disease, stage 3a: Secondary | ICD-10-CM | POA: Diagnosis not present

## 2020-01-22 DIAGNOSIS — F419 Anxiety disorder, unspecified: Secondary | ICD-10-CM | POA: Diagnosis not present

## 2020-01-22 DIAGNOSIS — J9601 Acute respiratory failure with hypoxia: Secondary | ICD-10-CM | POA: Diagnosis present

## 2020-01-22 DIAGNOSIS — Z7189 Other specified counseling: Secondary | ICD-10-CM | POA: Diagnosis not present

## 2020-01-22 DIAGNOSIS — N183 Chronic kidney disease, stage 3 unspecified: Secondary | ICD-10-CM

## 2020-01-22 DIAGNOSIS — I509 Heart failure, unspecified: Secondary | ICD-10-CM

## 2020-01-22 DIAGNOSIS — F0390 Unspecified dementia without behavioral disturbance: Secondary | ICD-10-CM | POA: Diagnosis present

## 2020-01-22 DIAGNOSIS — I10 Essential (primary) hypertension: Secondary | ICD-10-CM | POA: Diagnosis present

## 2020-01-22 DIAGNOSIS — Z7401 Bed confinement status: Secondary | ICD-10-CM | POA: Diagnosis not present

## 2020-01-22 DIAGNOSIS — I4891 Unspecified atrial fibrillation: Secondary | ICD-10-CM | POA: Diagnosis present

## 2020-01-22 DIAGNOSIS — Z886 Allergy status to analgesic agent status: Secondary | ICD-10-CM

## 2020-01-22 DIAGNOSIS — Z950 Presence of cardiac pacemaker: Secondary | ICD-10-CM | POA: Diagnosis not present

## 2020-01-22 DIAGNOSIS — M25512 Pain in left shoulder: Secondary | ICD-10-CM | POA: Diagnosis not present

## 2020-01-22 DIAGNOSIS — Z66 Do not resuscitate: Secondary | ICD-10-CM | POA: Diagnosis not present

## 2020-01-22 DIAGNOSIS — I083 Combined rheumatic disorders of mitral, aortic and tricuspid valves: Secondary | ICD-10-CM | POA: Diagnosis present

## 2020-01-22 DIAGNOSIS — I5031 Acute diastolic (congestive) heart failure: Secondary | ICD-10-CM | POA: Diagnosis not present

## 2020-01-22 DIAGNOSIS — R410 Disorientation, unspecified: Secondary | ICD-10-CM | POA: Diagnosis not present

## 2020-01-22 DIAGNOSIS — I482 Chronic atrial fibrillation, unspecified: Secondary | ICD-10-CM | POA: Diagnosis not present

## 2020-01-22 DIAGNOSIS — S299XXA Unspecified injury of thorax, initial encounter: Secondary | ICD-10-CM | POA: Diagnosis not present

## 2020-01-22 DIAGNOSIS — I110 Hypertensive heart disease with heart failure: Secondary | ICD-10-CM | POA: Diagnosis not present

## 2020-01-22 DIAGNOSIS — Z20822 Contact with and (suspected) exposure to covid-19: Secondary | ICD-10-CM | POA: Diagnosis not present

## 2020-01-22 DIAGNOSIS — S0990XA Unspecified injury of head, initial encounter: Secondary | ICD-10-CM | POA: Diagnosis not present

## 2020-01-22 DIAGNOSIS — G8929 Other chronic pain: Secondary | ICD-10-CM | POA: Diagnosis present

## 2020-01-22 DIAGNOSIS — I5023 Acute on chronic systolic (congestive) heart failure: Secondary | ICD-10-CM | POA: Diagnosis not present

## 2020-01-22 DIAGNOSIS — E785 Hyperlipidemia, unspecified: Secondary | ICD-10-CM | POA: Diagnosis present

## 2020-01-22 DIAGNOSIS — R627 Adult failure to thrive: Secondary | ICD-10-CM | POA: Diagnosis present

## 2020-01-22 DIAGNOSIS — R7989 Other specified abnormal findings of blood chemistry: Secondary | ICD-10-CM | POA: Diagnosis not present

## 2020-01-22 DIAGNOSIS — R296 Repeated falls: Secondary | ICD-10-CM | POA: Diagnosis present

## 2020-01-22 DIAGNOSIS — S4992XA Unspecified injury of left shoulder and upper arm, initial encounter: Secondary | ICD-10-CM | POA: Diagnosis not present

## 2020-01-22 DIAGNOSIS — K219 Gastro-esophageal reflux disease without esophagitis: Secondary | ICD-10-CM | POA: Diagnosis present

## 2020-01-22 DIAGNOSIS — Z7901 Long term (current) use of anticoagulants: Secondary | ICD-10-CM

## 2020-01-22 DIAGNOSIS — Z853 Personal history of malignant neoplasm of breast: Secondary | ICD-10-CM | POA: Diagnosis not present

## 2020-01-22 DIAGNOSIS — W19XXXA Unspecified fall, initial encounter: Secondary | ICD-10-CM | POA: Diagnosis present

## 2020-01-22 DIAGNOSIS — R531 Weakness: Secondary | ICD-10-CM | POA: Diagnosis not present

## 2020-01-22 DIAGNOSIS — Z888 Allergy status to other drugs, medicaments and biological substances status: Secondary | ICD-10-CM

## 2020-01-22 DIAGNOSIS — Z515 Encounter for palliative care: Secondary | ICD-10-CM | POA: Diagnosis not present

## 2020-01-22 DIAGNOSIS — R011 Cardiac murmur, unspecified: Secondary | ICD-10-CM | POA: Diagnosis present

## 2020-01-22 LAB — BASIC METABOLIC PANEL
Anion gap: 15 (ref 5–15)
BUN: 21 mg/dL (ref 8–23)
CO2: 22 mmol/L (ref 22–32)
Calcium: 9.2 mg/dL (ref 8.9–10.3)
Chloride: 101 mmol/L (ref 98–111)
Creatinine, Ser: 1.1 mg/dL — ABNORMAL HIGH (ref 0.44–1.00)
GFR calc Af Amer: 54 mL/min — ABNORMAL LOW (ref >60)
GFR calc non Af Amer: 46 mL/min — ABNORMAL LOW (ref >60)
Glucose, Bld: 155 mg/dL — ABNORMAL HIGH (ref 70–99)
Potassium: 4.9 mmol/L (ref 3.5–5.1)
Sodium: 138 mmol/L (ref 135–145)

## 2020-01-22 LAB — MRSA PCR SCREENING: MRSA by PCR: NEGATIVE

## 2020-01-22 LAB — TROPONIN I (HIGH SENSITIVITY)
Troponin I (High Sensitivity): 52 ng/L — ABNORMAL HIGH (ref <18)
Troponin I (High Sensitivity): 109 ng/L (ref <18)
Troponin I (High Sensitivity): 130 ng/L (ref <18)

## 2020-01-22 LAB — CBC
HCT: 35.8 % — ABNORMAL LOW (ref 36.0–46.0)
Hemoglobin: 11.8 g/dL — ABNORMAL LOW (ref 12.0–15.0)
MCH: 31.9 pg (ref 26.0–34.0)
MCHC: 33 g/dL (ref 30.0–36.0)
MCV: 96.8 fL (ref 80.0–100.0)
Platelets: 224 10*3/uL (ref 150–400)
RBC: 3.7 MIL/uL — ABNORMAL LOW (ref 3.87–5.11)
RDW: 14.9 % (ref 11.5–15.5)
WBC: 7.4 10*3/uL (ref 4.0–10.5)
nRBC: 0 % (ref 0.0–0.2)

## 2020-01-22 LAB — URINALYSIS, COMPLETE (UACMP) WITH MICROSCOPIC
Bacteria, UA: NONE SEEN
Bilirubin Urine: NEGATIVE
Glucose, UA: NEGATIVE mg/dL
Ketones, ur: 20 mg/dL — AB
Leukocytes,Ua: NEGATIVE
Nitrite: NEGATIVE
Protein, ur: >=300 mg/dL — AB
Specific Gravity, Urine: 1.022 (ref 1.005–1.030)
Squamous Epithelial / HPF: NONE SEEN (ref 0–5)
pH: 5 (ref 5.0–8.0)

## 2020-01-22 LAB — PROTIME-INR
INR: 2.9 — ABNORMAL HIGH (ref 0.8–1.2)
Prothrombin Time: 29 seconds — ABNORMAL HIGH (ref 11.4–15.2)

## 2020-01-22 LAB — CK: Total CK: 270 U/L — ABNORMAL HIGH (ref 38–234)

## 2020-01-22 LAB — SARS CORONAVIRUS 2 BY RT PCR (HOSPITAL ORDER, PERFORMED IN ~~LOC~~ HOSPITAL LAB): SARS Coronavirus 2: NEGATIVE

## 2020-01-22 LAB — BRAIN NATRIURETIC PEPTIDE: B Natriuretic Peptide: >4500 pg/mL — ABNORMAL HIGH (ref 0.0–100.0)

## 2020-01-22 MED ORDER — DM-GUAIFENESIN ER 30-600 MG PO TB12: 1.0000 | ORAL_TABLET | Freq: Two times a day (BID) | ORAL | Status: DC | PRN

## 2020-01-22 MED ORDER — CARVEDILOL 6.25 MG PO TABS
6.2500 mg | ORAL_TABLET | Freq: Two times a day (BID) | ORAL | Status: DC
  Administered 2020-01-23 – 2020-01-25 (×5): 6.25 mg via ORAL
  Filled 2020-01-22 (×6): qty 1

## 2020-01-22 MED ORDER — ACETAMINOPHEN 325 MG PO TABS: 650.0000 mg | ORAL_TABLET | Freq: Four times a day (QID) | ORAL | Status: DC | PRN

## 2020-01-22 MED ORDER — MELATONIN 5 MG PO TABS
2.5000 mg | ORAL_TABLET | Freq: Every day | ORAL | Status: DC
  Administered 2020-01-23 – 2020-01-24 (×2): 2.5 mg via ORAL
  Filled 2020-01-22 (×2): qty 1

## 2020-01-22 MED ORDER — LORAZEPAM 0.5 MG PO TABS: 0.5000 mg | ORAL_TABLET | Freq: Every evening | ORAL | Status: DC | PRN

## 2020-01-22 MED ORDER — HYDRALAZINE HCL 20 MG/ML IJ SOLN
5.0000 mg | INTRAMUSCULAR | Status: DC | PRN
  Administered 2020-01-22 – 2020-01-23 (×3): 5 mg via INTRAVENOUS
  Filled 2020-01-22 (×3): qty 1

## 2020-01-22 MED ORDER — SODIUM CHLORIDE 0.9% FLUSH: 3.0000 mL | INTRAVENOUS | Status: DC | PRN

## 2020-01-22 MED ORDER — SODIUM CHLORIDE 0.9% FLUSH
3.0000 mL | Freq: Two times a day (BID) | INTRAVENOUS | Status: DC
  Administered 2020-01-22 – 2020-01-25 (×7): 3 mL via INTRAVENOUS

## 2020-01-22 MED ORDER — CALCIUM CARBONATE ANTACID 500 MG PO CHEW
600.0000 mg | CHEWABLE_TABLET | Freq: Every day | ORAL | Status: DC
  Administered 2020-01-23 – 2020-01-25 (×3): 600 mg via ORAL
  Filled 2020-01-22 (×3): qty 3

## 2020-01-22 MED ORDER — GABAPENTIN 100 MG PO CAPS
200.0000 mg | ORAL_CAPSULE | Freq: Three times a day (TID) | ORAL | Status: DC
  Administered 2020-01-22 – 2020-01-25 (×6): 200 mg via ORAL
  Filled 2020-01-22 (×7): qty 2

## 2020-01-22 MED ORDER — LOSARTAN POTASSIUM 50 MG PO TABS
50.0000 mg | ORAL_TABLET | Freq: Every day | ORAL | Status: DC
  Administered 2020-01-22 – 2020-01-25 (×4): 50 mg via ORAL
  Filled 2020-01-22 (×4): qty 1

## 2020-01-22 MED ORDER — VITAMIN B-12 1000 MCG PO TABS
1000.0000 ug | ORAL_TABLET | Freq: Every day | ORAL | Status: DC
  Administered 2020-01-22 – 2020-01-25 (×4): 1000 ug via ORAL
  Filled 2020-01-22 (×4): qty 1

## 2020-01-22 MED ORDER — ENSURE ENLIVE PO LIQD
237.0000 mL | Freq: Two times a day (BID) | ORAL | Status: DC
  Administered 2020-01-24 – 2020-01-25 (×3): 237 mL via ORAL

## 2020-01-22 MED ORDER — FUROSEMIDE 10 MG/ML IJ SOLN
60.0000 mg | Freq: Once | INTRAMUSCULAR | Status: AC
  Administered 2020-01-22: 60 mg via INTRAVENOUS
  Filled 2020-01-22: qty 8

## 2020-01-22 MED ORDER — LEVOTHYROXINE SODIUM 25 MCG PO TABS
25.0000 ug | ORAL_TABLET | Freq: Every day | ORAL | Status: DC
  Administered 2020-01-23 – 2020-01-25 (×3): 25 ug via ORAL
  Filled 2020-01-22 (×3): qty 1

## 2020-01-22 MED ORDER — CITALOPRAM HYDROBROMIDE 20 MG PO TABS
20.0000 mg | ORAL_TABLET | Freq: Every day | ORAL | Status: DC
  Administered 2020-01-22 – 2020-01-25 (×4): 20 mg via ORAL
  Filled 2020-01-22 (×4): qty 1

## 2020-01-22 MED ORDER — ALBUTEROL SULFATE (2.5 MG/3ML) 0.083% IN NEBU: 2.5000 mg | INHALATION_SOLUTION | RESPIRATORY_TRACT | Status: DC | PRN

## 2020-01-22 MED ORDER — NITROFURANTOIN MONOHYD MACRO 100 MG PO CAPS
100.0000 mg | ORAL_CAPSULE | Freq: Two times a day (BID) | ORAL | Status: DC
  Administered 2020-01-23 – 2020-01-24 (×3): 100 mg via ORAL
  Filled 2020-01-22 (×5): qty 1

## 2020-01-22 MED ORDER — ONDANSETRON HCL 4 MG/2ML IJ SOLN: 4.0000 mg | Freq: Three times a day (TID) | INTRAMUSCULAR | Status: DC | PRN

## 2020-01-22 MED ORDER — SODIUM CHLORIDE 0.9 % IV SOLN: 250.0000 mL | INTRAVENOUS | Status: DC | PRN

## 2020-01-22 MED ORDER — DONEPEZIL HCL 5 MG PO TABS
10.0000 mg | ORAL_TABLET | Freq: Every day | ORAL | Status: DC
  Administered 2020-01-23 – 2020-01-24 (×2): 10 mg via ORAL
  Filled 2020-01-22 (×4): qty 2

## 2020-01-22 MED ORDER — QUETIAPINE FUMARATE 25 MG PO TABS
12.5000 mg | ORAL_TABLET | Freq: Two times a day (BID) | ORAL | Status: DC
  Administered 2020-01-22 – 2020-01-25 (×6): 12.5 mg via ORAL
  Filled 2020-01-22 (×6): qty 1

## 2020-01-22 MED ORDER — DOCUSATE SODIUM 100 MG PO CAPS: 100.0000 mg | ORAL_CAPSULE | Freq: Every day | ORAL | Status: DC | PRN

## 2020-01-22 MED ORDER — LAMOTRIGINE 100 MG PO TABS
50.0000 mg | ORAL_TABLET | Freq: Every day | ORAL | Status: DC
  Administered 2020-01-22 – 2020-01-25 (×4): 50 mg via ORAL
  Filled 2020-01-22 (×4): qty 1

## 2020-01-22 MED ORDER — DULOXETINE HCL 30 MG PO CPEP
30.0000 mg | ORAL_CAPSULE | Freq: Every day | ORAL | Status: DC
  Administered 2020-01-22 – 2020-01-25 (×4): 30 mg via ORAL
  Filled 2020-01-22 (×4): qty 1

## 2020-01-22 NOTE — Consult Note (Signed)
ANTICOAGULATION CONSULT NOTE - Initial Consult  Pharmacy Consult for Warfarin Indication: a fib  Allergies  Allergen Reactions  . Naproxen     Other reaction(s): Other (See Comments) GI upset  . Sucralfate     Other reaction(s): Other (See Comments) Abdominal bloating  . Prednisone Anxiety    Shakes    Patient Measurements: Height: 5\' 3"  (160 cm) Weight: 68 kg (149 lb 14.6 oz) IBW/kg (Calculated) : 52.4  Vital Signs: Temp: 97.9 F (36.6 C) (05/22 1420) Temp Source: Oral (05/22 1420) BP: 197/99 (05/22 1420) Pulse Rate: 76 (05/22 1420)  Labs: Recent Labs    01/22/20 0728 01/22/20 1209  HGB 11.8*  --   HCT 35.8*  --   PLT 224  --   LABPROT  --  29.0*  INR  --  2.9*  CREATININE 1.10*  --   CKTOTAL 270*  --   TROPONINIHS 52* 89*    Estimated Creatinine Clearance: 35.8 mL/min (A) (by C-G formula based on SCr of 1.1 mg/dL (H)).   Medical History: Past Medical History:  Diagnosis Date  . Anemia   . Anxiety   . Arthritis   . Breast cancer ([place]) 2011   rt- radiation  . Cancer [facility]) 2011   rt breast  . Depression   . Dysrhythmia    CHB s/p PPM, afib history  . GERD (gastroesophageal reflux disease)   . Heart murmur   . Hypertension   . Personal history of radiation therapy   . Presence of permanent cardiac pacemaker     Medications:  Warfarin pta dosing 5mg  M-F and 7.5mg  S/Sun.  Last dose take pta 5/21 @ 1700 (5mg )  Assessment: Pharmacy has been consulted to continue warfarin therapy for a fib.    INR Warfarin dose 05/22 1209  2.9 7.5mg   Goal of Therapy:  INR 2-3 Monitor platelets by anticoagulation protocol: Yes   Plan:  Will resume pt home dosing with 7.5mg  this evening - will assess INR/CBC with am labs.  Pharmacy will monitor and continue to adjust warfarin dosing per clinical feedback.  Lu Duffel, PharmD, BCPS Clinical Pharmacist 01/22/2020 2:58 PM

## 2020-01-22 NOTE — ED Triage Notes (Signed)
Patient arrived via EMS from [facility] (Memory Care Unit). Patient is AOx1, unsure of ambulatory status. Patient was found on floor by staff during morning rounds. Patient is not in any pain and does have chronic left should pain. Staff at facility did state she is always weak however stated that she appears "a little weaker". No bruises on body upon arrival after physical assessment.   Patient was placed on 2L via Nasal canula due to O2 saturation being in the low 90's.

## 2020-01-22 NOTE — TOC Initial Note (Addendum)
Transition of Care [facility]) - Initial/Assessment Note    Patient Details  Name: Sally Carroll MRN: [id number] Date of Birth: Jul 01, 1934  Transition of [facility]) CM/SW Contact:    Ova Freshwater Phone Number: [phone] 01/22/2020, 3:55 PM  Clinical Narrative:                  Patient admitted due to fall at [facility]  Patient has a diagnosis of dementia and has fluctuating orientation.  This CSW contacted patient's daughter-in-law Elon Jester [phone], for collateral information.  This CSW updated Ms. Roney on possible SNF recommendation.  This CSW informed Ms. Roney about process of obtaining approval for SNF placement.  This CSW agreed to leave Medicare.gov facility list at patient's bedside on 01/23/2020, so Ms. Roney could look at it.  This CSW informed Ms. Roney on the process for home health and/or hospice if the patient does not get SNF approval or if they decide to have the [facility] return to [facility].  Ms. Valma Cava did express cocern about the patient falling several times at [facility].  The patient needs full assistance w/ all ALDs and is unable to feed herself, dress herself, bath and is incontinent.  Expected Discharge Plan: [place]     Patient Goals and CMS Choice Patient states their goals for this hospitalization and ongoing recovery are:: Patient is not oriented. CMS Medicare.gov Compare Post Acute Care list provided to:: Patient Represenative (must comment)(Patient's daughter-in-law, Elon Jester.) Choice offered to / list presented to : Adult Children(Spoke w/ daughter-in-law [name] and she said to leave list at patient bedside.)  Expected Discharge Plan and Services Expected Discharge Plan: [place] In-house Referral: Clinical Social Work   Post Acute Care Choice: [place] Living arrangements for the past 2 months: Assisted Living Facility([facility] - Memory Care Unit.)                                       Prior Living Arrangements/Services Living arrangements for the past 2 months: Assisted Living Facility([facility] - Memory Care Unit.) Lives with:: Facility Resident Patient language and need for interpreter reviewed:: Yes Do you feel safe going back to the place where you live?: Yes      Need for Family Participation in Patient Care: Yes (Comment) Care giver support system in place?: Yes (comment)   Criminal Activity/Legal Involvement Pertinent to Current Situation/Hospitalization: No - Comment as needed  Activities of Daily Living      Permission Sought/Granted Permission sought to share information with : Family Supports Permission granted to share information with : No(Patient has diagnosed w/ dementia, fluctuating orientation.)  Share Information with NAME: Elon Jester D-I-L     Permission granted to share info w Relationship: daughter in law     Emotional Assessment Appearance:: Appears stated age Attitude/Demeanor/Rapport: Unable to Assess Affect (typically observed): Unable to Assess Orientation: : Fluctuating Orientation (Suspected and/or reported [place]) Alcohol / Substance Use: Not Applicable Psych Involvement: No (comment)  Admission diagnosis:  Fall [W19.XXXA] Fall, initial encounter [id number]] Acute on chronic systolic (congestive) heart failure (HCC) [I50.23] Congestive heart failure, unspecified HF chronicity, unspecified heart failure type [facility]) [I50.9] Patient Active Problem List   Diagnosis Date Noted  . Acute on chronic systolic CHF (congestive heart failure) ([place]) 01/22/2020  . Acute respiratory failure with hypoxia ([place]) 01/22/2020  . Fall   . HCAP (healthcare-associated  pneumonia) 10/13/2019  . Acute metabolic encephalopathy [id number]  . Hypothyroid 10/13/2019  . CKD (chronic kidney disease), stage IIIa 10/13/2019  . Dementia ([place]) 10/13/2019  . Intermediate stage dry age-related macular degeneration of both eyes 07/01/2017  .  Pterygium eye, left 04/02/2017  . Late onset Alzheimer's disease with behavioral disturbance ([place]) 11/01/2016  . Rotator cuff tendinitis, left 07/29/2016  . Lumbar spondylosis 05/27/2016  . Pneumonia 11/09/2015  . Herniated lumbar intervertebral disc 02/10/2015  . 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 Lumbar radiculitis 11/11/2014  . Primary osteoarthritis of right hip 10/24/2014  . Absolute anemia 01/14/2014  . Long term (current) use of anticoagulants 12/31/2013  . NSVT (nonsustained ventricular tachycardia) ([place]) 09/04/2011  . Anxiety and depression 08/29/2011  . A-fib ([place]) 08/29/2011  . Breast CA ([place]) 08/29/2011  . Artificial cardiac pacemaker 08/29/2011  . Acquired complete AV block ([place]) 08/29/2011  . Hyperlipidemia, unspecified 08/29/2011  . Hypertension 08/29/2011  . Restless leg syndrome 08/29/2011   PCP:  Tracie Harrier, MD Pharmacy:   [facility], [place] [address] [place] [address] Phone: [phone] Fax: [phone]     Social Determinants of Health (SDOH) Interventions    Readmission Risk Interventions No flowsheet data found.

## 2020-01-22 NOTE — H&P (Signed)
History and Physical    Sally Carroll [id number] DOB: [birth year] DOA: 01/22/2020  Referring MD/NP/PA:   PCP: Tracie Harrier, MD   Patient coming from:  The patient is coming from SNF.  At baseline, pt is dependent for most of ADL.        Chief Complaint: fall  HPI: Sally Carroll is a 87 y.o. female with medical history significant of hypertension, hyperlipidemia, GERD, depression, pacemaker placement due to complete heart block, breast cancer (s/p of radiation therapy), anemia, atrial fibrillation on Coumadin, CKD-3, dementia, sCHF with EF 30%, who presents with fall.  Per her niece, patient has dementia, cannot answer any question which is normal to patient.  Per report, patient had an unwitnessed fall in the nursing home and found on the floor this AM.  No bruise on her body.  Patient has chronic left shoulder pain.  Does not seem to have pain in other areas.  No active respiratory distress, cough, nausea, vomiting, diarrhea noted.  She moves all extremities.  No facial droop. Patient was found to have oxygen desaturation to 77% on room air in ED.  ED Course: pt was found to have WBC 7.4, BNP >4500,  pending COVID-19 PCR, CK 270, troponin 52, renal function stable, temperature normal, blood pressure 169/109, heart rate 84, tachypnea.  Chest x-ray showed vascular congestion, interstitial pulmonary edema.  CT of head is negative for acute intracranial abnormalities.  Patient is placed on progressive bed for observation.  Review of Systems: Could not be reviewed due to dementia   Allergy:  Allergies  Allergen Reactions  . Naproxen     Other reaction(s): Other (See Comments) GI upset  . Sucralfate     Other reaction(s): Other (See Comments) Abdominal bloating  . Prednisone Anxiety    Shakes    Past Medical History:  Diagnosis Date  . Anemia   . Anxiety   . Arthritis   . Breast cancer ([place]) 2011   rt- radiation  . Cancer [facility]) 2011   rt breast  . Depression   .  Dysrhythmia    CHB s/p PPM, afib history  . GERD (gastroesophageal reflux disease)   . Heart murmur   . Hypertension   . Personal history of radiation therapy   . Presence of permanent cardiac pacemaker     Past Surgical History:  Procedure Laterality Date  . BREAST BIOPSY Right 2011   right lumpectomy rad  . BREAST SURGERY Right    bx  . EYE SURGERY Bilateral    cataracts  . LUMBAR LAMINECTOMY/DECOMPRESSION MICRODISCECTOMY Right 02/10/2015   Procedure: Right Lumbar Four-Five Microdiskectomy;  Surgeon: Erline Levine, MD;  Location: [place] [facility];  Service: Neurosurgery;  Laterality: Right;  Right L4-5 Microdiskectomy  . PACEMAKER INSERTION     99.07    Social History:  reports that she has never smoked. She has never used smokeless tobacco. She reports that she does not drink alcohol or use drugs.  Family History:  Family History  Problem Relation Age of Onset  . COPD Mother   . Stroke Mother   . Atrial fibrillation Mother   . Lung cancer Father   . Breast cancer Neg Hx      Prior to Admission medications   Medication Sig Start Date End Date Taking? Authorizing Provider  acetaminophen (TYLENOL) 325 MG tablet Take 650 mg by mouth 2 (two) times daily as needed for mild pain or moderate pain.    [provider]  carvedilol (COREG) 6.25  MG tablet Take 1 tablet (6.25 mg total) by mouth 2 (two) times daily with a meal. 10/15/19   Fritzi Mandes, MD  citalopram (CELEXA) 20 MG tablet Take 20 mg by mouth daily.     [provider]  docusate sodium (COLACE) 100 MG capsule Take 100 mg by mouth daily.     [provider]  donepezil (ARICEPT) 10 MG tablet Take 10 mg by mouth at bedtime.     [provider]  DULoxetine (CYMBALTA) 20 MG capsule Take 20 mg by mouth daily.    [provider]  feeding supplement, ENSURE ENLIVE, (ENSURE ENLIVE) LIQD Take 237 mLs by mouth 2 (two) times daily between meals. 10/15/19   Fritzi Mandes, MD  gabapentin (NEURONTIN)  100 MG capsule Take 200 mg by mouth 3 (three) times daily.    [provider]  guaiFENesin (MUCINEX) 600 MG 12 hr tablet Take 600 mg by mouth 2 (two) times daily as needed for to loosen phlegm.    [provider]  lamoTRIgine (LAMICTAL) 25 MG tablet Take 25 mg by mouth daily.    [provider]  levothyroxine (SYNTHROID) 25 MCG tablet Take 25 mcg by mouth daily before breakfast.    [provider]  LORazepam (ATIVAN) 0.5 MG tablet Take 0.5 mg by mouth at bedtime as needed (agitation).     [provider]  losartan (COZAAR) 50 MG tablet Take 50 mg by mouth daily.    [provider]  QUEtiapine (SEROQUEL) 25 MG tablet Take 12.5 mg by mouth 2 (two) times daily.     [provider]  vitamin B-12 (CYANOCOBALAMIN) 1000 MCG tablet Take 1,000 mcg by mouth daily.    [provider]  warfarin (COUMADIN) 5 MG tablet Take 5 mg by mouth every Monday, Tuesday, Wednesday, Thursday, and Friday.     [provider]  warfarin (COUMADIN) 7.5 MG tablet Take 7.5 mg by mouth See admin instructions. Saturday and Sunday    [provider]    Physical Exam: Vitals:   01/22/20 1056 01/22/20 1100 01/22/20 1130 01/22/20 1200  BP:  (!) 187/102 (!) 196/109 (!) 192/122  Pulse: 75 76 84 75  Resp: 15 19 (!) 25 18  Temp:      TempSrc:      SpO2: 92% 93% 91% 91%  Weight:      Height:       General: Not in acute distress HEENT:       Eyes: PERRL, EOMI, no scleral icterus.       ENT: No discharge from the ears and nose, no pharynx injection, no tonsillar enlargement.        Neck: positive JVD, no bruit, no mass felt. Heme: No neck lymph node enlargement. Cardiac: S1/S2, RRR, has 1/6 systolic murmurs, No gallops or rubs. Respiratory:  No rales, wheezing, rhonchi or rubs. GI: Soft, nondistended, nontender, no rebound pain, no organomegaly, BS present. GU: No hematuria Ext: Trace leg edema bilaterally. 2+DP/PT pulse  bilaterally. Musculoskeletal: No joint deformities, No joint redness or warmth, no limitation of ROM in spin. Skin: No rashes.  Neuro: not following command, not answering questions, cranial nerves II-XII grossly intact, moves all extremities  Psych: Patient is not psychotic, no suicidal or hemocidal ideation.  Labs on Admission: I have personally reviewed following labs and imaging studies  CBC: Recent Labs  Lab 01/22/20 0728  WBC 7.4  HGB 11.8*  HCT 35.8*  MCV 96.8  PLT XX123456   Basic Metabolic  Panel: Recent Labs  Lab 01/22/20 0728  NA 138  K 4.9  CL 101  CO2 22  GLUCOSE 155*  BUN 21  CREATININE 1.10*  CALCIUM 9.2   GFR: Estimated Creatinine Clearance: 35.8 mL/min (A) (by C-G formula based on SCr of 1.1 mg/dL (H)). Liver Function Tests: No results for input(s): AST, ALT, ALKPHOS, BILITOT, PROT, ALBUMIN in the last 168 hours. No results for input(s): LIPASE, AMYLASE in the last 168 hours. No results for input(s): AMMONIA in the last 168 hours. Coagulation Profile: No results for input(s): INR, PROTIME in the last 168 hours. Cardiac Enzymes: Recent Labs  Lab 01/22/20 0728  CKTOTAL 270*   BNP (last 3 results) No results for input(s): PROBNP in the last 8760 hours. HbA1C: No results for input(s): HGBA1C in the last 72 hours. CBG: No results for input(s): GLUCAP in the last 168 hours. Lipid Profile: No results for input(s): CHOL, HDL, LDLCALC, TRIG, CHOLHDL, LDLDIRECT in the last 72 hours. Thyroid Function Tests: No results for input(s): TSH, T4TOTAL, FREET4, T3FREE, THYROIDAB in the last 72 hours. Anemia Panel: No results for input(s): VITAMINB12, FOLATE, FERRITIN, TIBC, IRON, RETICCTPCT in the last 72 hours. Urine analysis:    Component Value Date/Time   COLORURINE YELLOW (A) 01/22/2020 0824   APPEARANCEUR HAZY (A) 01/22/2020 0824   APPEARANCEUR Clear 07/07/2018 1027   LABSPEC 1.022 01/22/2020 0824   LABSPEC 1.024 01/04/2014 1517   PHURINE 5.0 01/22/2020  0824   GLUCOSEU NEGATIVE 01/22/2020 0824   GLUCOSEU Negative 01/04/2014 1517   HGBUR LARGE (A) 01/22/2020 0824   BILIRUBINUR NEGATIVE 01/22/2020 0824   BILIRUBINUR Negative 07/07/2018 1027   BILIRUBINUR Negative 01/04/2014 1517   KETONESUR 20 (A) 01/22/2020 0824   PROTEINUR >=300 (A) 01/22/2020 0824   NITRITE NEGATIVE 01/22/2020 0824   LEUKOCYTESUR NEGATIVE 01/22/2020 0824   LEUKOCYTESUR Negative 01/04/2014 1517   Sepsis Labs: @LABRCNTIP (procalcitonin:4,lacticidven:4) )No results found for this or any previous visit (from the past 240 hour(s)).   Radiological Exams on Admission: CT Head Wo Contrast  Result Date: 01/22/2020 CLINICAL DATA:  Recent fall, initial encounter EXAM: CT HEAD WITHOUT CONTRAST TECHNIQUE: Contiguous axial images were obtained from the base of the skull through the vertex without intravenous contrast. COMPARISON:  10/13/2019 FINDINGS: Brain: No evidence of acute infarction, hemorrhage, hydrocephalus, extra-axial collection or mass lesion/mass effect. Chronic atrophic and ischemic changes are again seen. Vascular: No hyperdense vessel or unexpected calcification. Skull: Normal. Negative for fracture or focal lesion. Sinuses/Orbits: No acute finding. Other: None. IMPRESSION: Chronic atrophic and ischemic changes without acute abnormality. Electronically Signed   By: Inez Catalina M.D.   On: 01/22/2020 08:14   DG Chest Portable 1 View  Result Date: 01/22/2020 CLINICAL DATA:  Recent fall EXAM: PORTABLE CHEST 1 VIEW COMPARISON:  10/13/2019 FINDINGS: Cardiac shadow is enlarged but stable. Pacing device is again seen. Aortic calcifications are noted. Diffuse vascular congestion is again identified and stable. Mild parenchymal edema is noted. No focal infiltrate or sizable effusion is seen. No bony abnormality is noted. IMPRESSION: Vascular congestion and mild interstitial edema consistent with early CHF. Electronically Signed   By: Inez Catalina M.D.   On: 01/22/2020 10:48      EKG: Independently reviewed.  Paced rhythm, QTC 556   Assessment/Plan Principal Problem:   Acute on chronic systolic CHF (congestive heart failure) (HCC) Active Problems:   Anxiety and depression   A-fib (HCC)   Hypertension   Hypothyroid   CKD (chronic kidney disease), stage IIIa   Dementia ([place])  Acute respiratory failure with hypoxia ([place])   Fall   Acute respiratory failure with hypoxia due to acute on chronic systolic CHF (congestive heart failure): Patient has elevated BNP> 40 500, mild leg edema, positive JVD, chest x-ray showed vascular congestion and interstitial pulmonary edema, clinically consistent with CHF exacerbation.  2D echo on 09/16/2018 showed EF of 30%.  -Place to progressive unit for observation -Lasix 40 mg bid by IV (patient received 60 mg Lasix in ED) -trend trop -2d echo -Daily weights -strict I/O's -Low salt diet -Fluid restriction -Obtain REDs Vest reading  Depression and anxiety: Stable, no suicidal or homicidal ideations. -Continue home medications: Cymbalta, Celexa, Lamictal, Ativan, Seroquel  A-fib (HCC)-chronic: -continue coumadin and coreg  HTN:  -Continue home medications: Coreg and Cozaar -hydralazine prn  Hypothyroid -Synthroid  CKD (chronic kidney disease), stage IIIa:stable -Follow-up by BMP  Dementia ([place]): No behavior disturbance -Continue donepezil  Fall: CT head negative.  Patient has chronic left shoulder pain -We will get x-ray of left shoulder      DVT ppx: on Couamdin Code Status: DNR (pt has DNR form from SNF) Family Communication: Yes, patient's niece   at bed side Disposition Plan:  Anticipate discharge back to previous SNF Consults called:  none Admission status:  progressive unit for obs     Status is: Observation  The patient remains OBS appropriate and will d/c before 2 midnights.  Dispo: The patient is from: SNF              Anticipated d/c is to: SNF              Anticipated d/c date is:  1 day              Patient currently is not medically stable to d/c.           Date of Service 01/22/2020    Ivor Costa [facility]   If 7PM-7AM, please contact night-coverage [email or web address] 01/22/2020, 12:50 PM

## 2020-01-22 NOTE — Discharge Instructions (Addendum)
You have been seen in the emergency department today after a fall.  Your work-up is overall reassuring however your chest x-ray does show a very small amount of fluid accumulating on the lungs.  Please begin taking your diuretic as prescribed.  Please call the number provided for cardiology to arrange a follow-up appointment this week for recheck/reevaluation and possible echocardiogram.  Return to the emergency department for any symptoms personally concerning to yourself.

## 2020-01-22 NOTE — ED Provider Notes (Addendum)
[facility] Emergency Department Provider Note  Time seen: 7:22 AM  I have reviewed the triage vital signs and the nursing notes.   HISTORY  Chief Complaint Fall   HPI Sally Carroll is a 86 y.o. female with a past medical history of anemia, depression, hypertension, dementia, CKD, presents to the emergency department after a fall.  According to staff patient was found on the floor at her nursing facility.  This was an unwitnessed fall.  Patient has a history of dementia and cannot contribute to her history or review of systems.  Here the patient awakens to voice, denies any pain.  Per staff they state the patient has been slightly weaker compared to her baseline.   Past Medical History:  Diagnosis Date  . Anemia   . Anxiety   . Arthritis   . Breast cancer ([place]) 2011   rt- radiation  . Cancer [facility]) 2011   rt breast  . Depression   . Dysrhythmia    CHB s/p PPM, afib history  . GERD (gastroesophageal reflux disease)   . Heart murmur   . Hypertension   . Personal history of radiation therapy   . Presence of permanent cardiac pacemaker     Patient Active Problem List   Diagnosis Date Noted  . HCAP (healthcare-associated pneumonia) 10/13/2019  . Acute metabolic encephalopathy [id number]  . Hypothyroid 10/13/2019  . CKD (chronic kidney disease), stage IIIa 10/13/2019  . Dementia ([place]) 10/13/2019  . Intermediate stage dry age-related macular degeneration of both eyes 07/01/2017  . Pterygium eye, left 04/02/2017  . Late onset Alzheimer's disease with behavioral disturbance ([place]) 11/01/2016  . Rotator cuff tendinitis, left 07/29/2016  . Lumbar spondylosis 05/27/2016  . Pneumonia 11/09/2015  . Herniated lumbar intervertebral disc 02/10/2015  . 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 Lumbar radiculitis 11/11/2014  . Primary osteoarthritis of right hip 10/24/2014  . Absolute anemia 01/14/2014  . Long  term (current) use of anticoagulants 12/31/2013  . NSVT (nonsustained ventricular tachycardia) ([place]) 09/04/2011  . Anxiety and depression 08/29/2011  . A-fib ([place]) 08/29/2011  . Breast CA ([place]) 08/29/2011  . Artificial cardiac pacemaker 08/29/2011  . Acquired complete AV block ([place]) 08/29/2011  . Hyperlipidemia, unspecified 08/29/2011  . Hypertension 08/29/2011  . Restless leg syndrome 08/29/2011    Past Surgical History:  Procedure Laterality Date  . BREAST BIOPSY Right 2011   right lumpectomy rad  . BREAST SURGERY Right    bx  . EYE SURGERY Bilateral    cataracts  . LUMBAR LAMINECTOMY/DECOMPRESSION MICRODISCECTOMY Right 02/10/2015   Procedure: Right Lumbar Four-Five Microdiskectomy;  Surgeon: Erline Levine, MD;  Location: [place] [facility];  Service: Neurosurgery;  Laterality: Right;  Right L4-5 Microdiskectomy  . PACEMAKER INSERTION     99.07    Prior to Admission medications   Medication Sig Start Date End Date Taking? Authorizing Provider  acetaminophen (TYLENOL) 325 MG tablet Take 650 mg by mouth 2 (two) times daily as needed for mild pain or moderate pain.    [provider]  carvedilol (COREG) 6.25 MG tablet Take 1 tablet (6.25 mg total) by mouth 2 (two) times daily with a meal. 10/15/19   Fritzi Mandes, MD  citalopram (CELEXA) 20 MG tablet Take 20 mg by mouth daily.     [provider]  docusate sodium (COLACE) 100 MG capsule Take 100 mg by mouth daily.     [provider]  donepezil (ARICEPT) 10 MG tablet  Take 10 mg by mouth at bedtime.     [provider]  DULoxetine (CYMBALTA) 20 MG capsule Take 20 mg by mouth daily.    [provider]  feeding supplement, ENSURE ENLIVE, (ENSURE ENLIVE) LIQD Take 237 mLs by mouth 2 (two) times daily between meals. 10/15/19   Fritzi Mandes, MD  gabapentin (NEURONTIN) 100 MG capsule Take 200 mg by mouth 3 (three) times daily.    [provider]  guaiFENesin (MUCINEX) 600 MG 12 hr tablet Take 600  mg by mouth 2 (two) times daily as needed for to loosen phlegm.    [provider]  lamoTRIgine (LAMICTAL) 25 MG tablet Take 25 mg by mouth daily.    [provider]  levothyroxine (SYNTHROID) 25 MCG tablet Take 25 mcg by mouth daily before breakfast.    [provider]  LORazepam (ATIVAN) 0.5 MG tablet Take 0.5 mg by mouth at bedtime as needed (agitation).     [provider]  losartan (COZAAR) 50 MG tablet Take 50 mg by mouth daily.    [provider]  QUEtiapine (SEROQUEL) 25 MG tablet Take 12.5 mg by mouth 2 (two) times daily.     [provider]  vitamin B-12 (CYANOCOBALAMIN) 1000 MCG tablet Take 1,000 mcg by mouth daily.    [provider]  warfarin (COUMADIN) 5 MG tablet Take 5 mg by mouth every Monday, Tuesday, Wednesday, Thursday, and Friday.     [provider]  warfarin (COUMADIN) 7.5 MG tablet Take 7.5 mg by mouth See admin instructions. Saturday and Sunday    [provider]    Allergies  Allergen Reactions  . Naproxen     Other reaction(s): Other (See Comments) GI upset  . Sucralfate     Other reaction(s): Other (See Comments) Abdominal bloating  . Prednisone Anxiety    Shakes    Family History  Problem Relation Age of Onset  . COPD Mother   . Stroke Mother   . Atrial fibrillation Mother   . Lung cancer Father   . Breast cancer Neg Hx     Social History Social History   Tobacco Use  . Smoking status: Never Smoker  . Smokeless tobacco: Never Used  Substance Use Topics  . Alcohol use: No  . Drug use: No    Review of Systems Unable to obtain adequate/accurate review of systems secondary to baseline dementia. ____________________________________________   PHYSICAL EXAM:  Constitutional: Patient initially asleep awakens to voice, alert, answers questions denies any pain but cannot recall the events or contribute to her history. Eyes: Normal exam ENT      Head: Normocephalic  and atraumatic.      Mouth/Throat: Mucous membranes are moist. Cardiovascular: Normal rate, regular rhythm. Respiratory: Normal respiratory effort without tachypnea nor retractions. Breath sounds are clear Gastrointestinal: Soft and nontender. No distention.   Musculoskeletal: Nontender with normal range of motion in all extremities.  Neurologic:  Normal speech and language. No gross focal neurologic deficits Skin:  Skin is warm, dry and intact.  Psychiatric: Mood and affect are normal.   ____________________________________________    EKG  EKG viewed and interpreted by myself shows what appears to be atrial ventricular paced rhythm at 90 bpm with a widened QRS, nonspecific ST changes but no concerning ST elevation.  Largely unchanged from prior EKG 10/13/2019  ____________________________________________    RADIOLOGY  CT scan of the head is negative  ____________________________________________   INITIAL IMPRESSION / ASSESSMENT AND PLAN / ED COURSE  Pertinent labs & imaging results that were available during my care of the patient were reviewed by me and considered in my medical decision making (see chart for details).   Patient presents to the emergency department after an unwitnessed fall at her nursing facility.  Here the patient is awake, alert, answers questions but does not recall the events.  We will obtain a CT scan of the head as a precaution check labs and urine given the report of increased weakness continue to closely monitor.  Reassuringly patient has great range of motion in all extremities with no discomfort elicited.  CT scans negative.  Lab work largely reassuring.  There is hemoglobin in the UA without significant red blood cells added on a CK, mildly elevated 270.  However given her reassuring work-up we will discharge back to the nursing facility with instructions to increase oral fluid intake.  Patient did briefly desat while sleeping to 88/89%.  However when  awake she is satting around 92 to 93%.  I performed an x-ray as a precaution it does show mild interstitial edema.  Given this finding we will start the patient on low-dose Lasix.  However as the patient is satting in the 90s and otherwise appears well we will discharge back to her nursing facility.  We will refer to cardiology however for further evaluation and possible echocardiogram.  Patient's troponin has resulted slightly elevated at 52.  However patient appears to have thrown a 10 beat run of ventricular tachycardia.  Given these findings we will admit to the hospitalist service for continued monitoring and IV diuresis.  We will dose 60 mg of Lasix in the emergency department.  Sally Carroll was evaluated in Emergency Department on 01/22/2020 for the symptoms described in the history of present illness. She was evaluated in the context of the global COVID-19 pandemic, which necessitated consideration that the patient might be at risk for infection with the SARS-CoV-2 virus that causes COVID-19. Institutional protocols and algorithms that pertain to the evaluation of patients at risk for COVID-19 are in a state of rapid change based on information released by regulatory bodies including the CDC and federal and state organizations. These policies and algorithms were followed during the patient's care in the ED.  ____________________________________________   FINAL CLINICAL IMPRESSION(S) / ED DIAGNOSES  Cheral Marker, MD 01/22/20 [id number]    Harvest Dark, MD 01/22/20 1058    Harvest Dark, MD 01/22/20 1137

## 2020-01-23 ENCOUNTER — Observation Stay (HOSPITAL_COMMUNITY)
Admit: 2020-01-23 | Discharge: 2020-01-23 | Disposition: A | Discharge status: Home / Self Care | Payer: Medicare HMO | Attending: Internal Medicine | Admitting: Internal Medicine

## 2020-01-23 DIAGNOSIS — I5031 Acute diastolic (congestive) heart failure: Secondary | ICD-10-CM | POA: Diagnosis not present

## 2020-01-23 DIAGNOSIS — E039 Hypothyroidism, unspecified: Secondary | ICD-10-CM | POA: Diagnosis present

## 2020-01-23 DIAGNOSIS — G8929 Other chronic pain: Secondary | ICD-10-CM | POA: Diagnosis present

## 2020-01-23 DIAGNOSIS — I5043 Acute on chronic combined systolic (congestive) and diastolic (congestive) heart failure: Secondary | ICD-10-CM | POA: Diagnosis present

## 2020-01-23 DIAGNOSIS — I5023 Acute on chronic systolic (congestive) heart failure: Secondary | ICD-10-CM

## 2020-01-23 DIAGNOSIS — I442 Atrioventricular block, complete: Secondary | ICD-10-CM | POA: Diagnosis present

## 2020-01-23 DIAGNOSIS — Z923 Personal history of irradiation: Secondary | ICD-10-CM | POA: Diagnosis not present

## 2020-01-23 DIAGNOSIS — M25512 Pain in left shoulder: Secondary | ICD-10-CM | POA: Diagnosis present

## 2020-01-23 DIAGNOSIS — I083 Combined rheumatic disorders of mitral, aortic and tricuspid valves: Secondary | ICD-10-CM | POA: Diagnosis present

## 2020-01-23 DIAGNOSIS — F329 Major depressive disorder, single episode, unspecified: Secondary | ICD-10-CM | POA: Diagnosis present

## 2020-01-23 DIAGNOSIS — I130 Hypertensive heart and chronic kidney disease with heart failure and stage 1 through stage 4 chronic kidney disease, or unspecified chronic kidney disease: Secondary | ICD-10-CM | POA: Diagnosis present

## 2020-01-23 DIAGNOSIS — I482 Chronic atrial fibrillation, unspecified: Secondary | ICD-10-CM | POA: Diagnosis present

## 2020-01-23 DIAGNOSIS — F419 Anxiety disorder, unspecified: Secondary | ICD-10-CM | POA: Diagnosis present

## 2020-01-23 DIAGNOSIS — Z20822 Contact with and (suspected) exposure to covid-19: Secondary | ICD-10-CM | POA: Diagnosis present

## 2020-01-23 DIAGNOSIS — J9621 Acute and chronic respiratory failure with hypoxia: Secondary | ICD-10-CM | POA: Diagnosis present

## 2020-01-23 DIAGNOSIS — J9601 Acute respiratory failure with hypoxia: Secondary | ICD-10-CM | POA: Diagnosis not present

## 2020-01-23 DIAGNOSIS — E785 Hyperlipidemia, unspecified: Secondary | ICD-10-CM | POA: Diagnosis present

## 2020-01-23 DIAGNOSIS — N1831 Chronic kidney disease, stage 3a: Secondary | ICD-10-CM | POA: Diagnosis present

## 2020-01-23 DIAGNOSIS — I509 Heart failure, unspecified: Secondary | ICD-10-CM

## 2020-01-23 DIAGNOSIS — F0390 Unspecified dementia without behavioral disturbance: Secondary | ICD-10-CM | POA: Diagnosis present

## 2020-01-23 DIAGNOSIS — I5040 Unspecified combined systolic (congestive) and diastolic (congestive) heart failure: Secondary | ICD-10-CM | POA: Diagnosis not present

## 2020-01-23 DIAGNOSIS — E876 Hypokalemia: Secondary | ICD-10-CM | POA: Diagnosis present

## 2020-01-23 DIAGNOSIS — M1990 Unspecified osteoarthritis, unspecified site: Secondary | ICD-10-CM | POA: Diagnosis present

## 2020-01-23 DIAGNOSIS — Z66 Do not resuscitate: Secondary | ICD-10-CM | POA: Diagnosis present

## 2020-01-23 DIAGNOSIS — R296 Repeated falls: Secondary | ICD-10-CM | POA: Diagnosis present

## 2020-01-23 DIAGNOSIS — Z853 Personal history of malignant neoplasm of breast: Secondary | ICD-10-CM | POA: Diagnosis not present

## 2020-01-23 DIAGNOSIS — W19XXXA Unspecified fall, initial encounter: Secondary | ICD-10-CM | POA: Diagnosis present

## 2020-01-23 DIAGNOSIS — K219 Gastro-esophageal reflux disease without esophagitis: Secondary | ICD-10-CM | POA: Diagnosis present

## 2020-01-23 DIAGNOSIS — R627 Adult failure to thrive: Secondary | ICD-10-CM | POA: Diagnosis present

## 2020-01-23 DIAGNOSIS — Z950 Presence of cardiac pacemaker: Secondary | ICD-10-CM | POA: Diagnosis not present

## 2020-01-23 LAB — BASIC METABOLIC PANEL
Anion gap: 15 (ref 5–15)
BUN: 26 mg/dL — ABNORMAL HIGH (ref 8–23)
CO2: 24 mmol/L (ref 22–32)
Calcium: 9 mg/dL (ref 8.9–10.3)
Chloride: 100 mmol/L (ref 98–111)
Creatinine, Ser: 1.29 mg/dL — ABNORMAL HIGH (ref 0.44–1.00)
GFR calc Af Amer: 44 mL/min — ABNORMAL LOW (ref >60)
GFR calc non Af Amer: 38 mL/min — ABNORMAL LOW (ref >60)
Glucose, Bld: 124 mg/dL — ABNORMAL HIGH (ref 70–99)
Potassium: 3.7 mmol/L (ref 3.5–5.1)
Sodium: 139 mmol/L (ref 135–145)

## 2020-01-23 LAB — ECHOCARDIOGRAM COMPLETE
Height: 63 in
Weight: 1980.8 oz

## 2020-01-23 LAB — TROPONIN I (HIGH SENSITIVITY)
Troponin I (High Sensitivity): 89 ng/L — ABNORMAL HIGH (ref <18)
Troponin I (High Sensitivity): 195 ng/L (ref <18)
Troponin I (High Sensitivity): 186 ng/L (ref <18)

## 2020-01-23 LAB — LIPID PANEL
Cholesterol: 157 mg/dL (ref 0–200)
HDL: 31 mg/dL — ABNORMAL LOW (ref >40)
LDL Cholesterol: 107 mg/dL — ABNORMAL HIGH (ref 0–99)
Total CHOL/HDL Ratio: 5.1 RATIO
Triglycerides: 95 mg/dL (ref <150)
VLDL: 19 mg/dL (ref 0–40)

## 2020-01-23 LAB — CBC
HCT: 34 % — ABNORMAL LOW (ref 36.0–46.0)
Hemoglobin: 11.4 g/dL — ABNORMAL LOW (ref 12.0–15.0)
MCH: 32 pg (ref 26.0–34.0)
MCHC: 33.5 g/dL (ref 30.0–36.0)
MCV: 95.5 fL (ref 80.0–100.0)
Platelets: 219 10*3/uL (ref 150–400)
RBC: 3.56 MIL/uL — ABNORMAL LOW (ref 3.87–5.11)
RDW: 14.9 % (ref 11.5–15.5)
WBC: 9.7 10*3/uL (ref 4.0–10.5)
nRBC: 0 % (ref 0.0–0.2)

## 2020-01-23 LAB — HEMOGLOBIN A1C
Hgb A1c MFr Bld: 5.5 % (ref 4.8–5.6)
Mean Plasma Glucose: 111.15 mg/dL

## 2020-01-23 LAB — MAGNESIUM: Magnesium: 1.7 mg/dL (ref 1.7–2.4)

## 2020-01-23 LAB — PROTIME-INR
INR: 2.5 — ABNORMAL HIGH (ref 0.8–1.2)
Prothrombin Time: 26.2 seconds — ABNORMAL HIGH (ref 11.4–15.2)

## 2020-01-23 MED ORDER — WARFARIN SODIUM 7.5 MG PO TABS
7.5000 mg | ORAL_TABLET | Freq: Once | ORAL | Status: AC
  Administered 2020-01-23: 7.5 mg via ORAL
  Filled 2020-01-23: qty 1

## 2020-01-23 MED ORDER — WARFARIN - PHARMACIST DOSING INPATIENT: Freq: Every day | Status: DC

## 2020-01-23 MED ORDER — MORPHINE SULFATE (PF) 2 MG/ML IV SOLN: 1.0000 mg | INTRAVENOUS | Status: DC | PRN

## 2020-01-23 MED ORDER — TRAMADOL HCL 50 MG PO TABS
50.0000 mg | ORAL_TABLET | Freq: Two times a day (BID) | ORAL | Status: DC | PRN
  Administered 2020-01-25: 50 mg via ORAL
  Filled 2020-01-23: qty 1

## 2020-01-23 MED ORDER — FUROSEMIDE 10 MG/ML IJ SOLN
40.0000 mg | Freq: Every day | INTRAMUSCULAR | Status: DC
  Administered 2020-01-23 – 2020-01-25 (×3): 40 mg via INTRAVENOUS
  Filled 2020-01-23 (×3): qty 4

## 2020-01-23 NOTE — Progress Notes (Signed)
CRITICAL VALUE ALERT  Critical Value:  05/22 1209 -- Troponin 89  Date & Time Notied:  01/23/20 0035 Shae Roquejo  Provider Notified: Rachael Fee  Orders Received/Actions taken: STAT Lab Troponin

## 2020-01-23 NOTE — TOC Progression Note (Signed)
Transition of Care [facility]) - Progression Note    Patient Details  Name: Sally Carroll MRN: [id number] Date of Birth: 05/01/1934  [facility] CM/SW [place], [place] Phone Number: 01/23/2020, 10:44 AM  Clinical Narrative:     This CSW took the Medicare.gov SNF list for the patient's family to read. Spoke w/ patient's family about options for care after d/c.  Patient's family is still concern about the patient's need for 24 hour care, and do not feel that the patient is receiving the appropriate care at [facility]. The family is concerned the patient has fallen too many times, and they do not know if the patient is being supervised when she is moving.  The family is also unsure whether the patient needs palliative care or hospice care, and requested a consult w/ someone from palliative care.  This CSW sent the attending a message to request a palliative care consult. Patient's family has SNF placement list and this CSW requested they let her know which ones they picked to begin bed availability search.  Expected Discharge Plan: [place]    Expected Discharge Plan and Services Expected Discharge Plan: [place] In-house Referral: Clinical Social Work   Post Acute Care Choice: [place] Living arrangements for the past 2 months: Assisted Living Facility([facility]                                       Social Determinants of Health (SDOH) Interventions    Readmission Risk Interventions No flowsheet data found.

## 2020-01-23 NOTE — Progress Notes (Signed)
*  PRELIMINARY RESULTS* Echocardiogram 2D Echocardiogram has been performed.  Sherrie Sport 01/23/2020, 8:22 AM

## 2020-01-23 NOTE — Progress Notes (Signed)
PROGRESS NOTE    Sally Carroll  [id number] DOB: 1934-04-17 DOA: 01/22/2020 PCP: Tracie Harrier, MD      Assessment & Plan:   Principal Problem:   Acute on chronic systolic CHF (congestive heart failure) ([place]) Active Problems:   Anxiety and depression   A-fib (HCC)   Hypertension   Hypothyroid   CKD (chronic kidney disease), stage IIIa   Dementia ([place])   Acute respiratory failure with hypoxia ([place])   Fall   Acute hypoxic respiratory failure: secondary to acute on chronic systolic CHF exacerbation. Continue on supplemental oxygen and wean as tolerated   Acute on chronic systolic CHF exacerbation: echo on 09/16/2018 showed EF of 30%. Continue on IV lasix. Strict I/Os & daily weights. Echo pending. Obtain REDs Vest reading  Depression and anxiety: no suicidal or homicidal ideations. Severity unknown. Continue on home dose of cymbalta, celexa, lamictal, ativan, seroquel  Chronic a. fib: continue warfarin and coreg  HTN: continue on home dose of coreg and cozaar. Hydralazine prn  Hypothyroidism: continue on home dose of synthroid  CKDIIIa: stable. Will continue to monitor   Dementia: continue donepezil. Not oriented to person, place or time   Fall: CT head negative. Chronic left shoulder pain. XR left shoulder shows neg fracture or dislocation. Morphine, tramadol prn    DVT prophylaxis: warfarin Code Status: DNR Family Communication: discussed pt's care w/ pt's sister and answered her questions. Family is requesting hospice. Will discuss w/ hospice tomorrow  Disposition Plan: possibly inpatient hospice    Consultants:      Procedures:   Antimicrobials:   Subjective: Pt is not able to answer questions appropriately   Objective: Vitals:   01/23/20 0404 01/23/20 0555 01/23/20 0638 01/23/20 0734  BP: (!) 186/100 (!) 177/91 (!) 175/80 (!) 168/98  Pulse: 79 75 76 80  Resp:  20    Temp: 97.8 F (36.6 C)     TempSrc: Oral     SpO2: 100% 99%      Weight: 56.2 kg     Height:        Intake/Output Summary (Last 24 hours) at 01/23/2020 0757 Last data filed at 01/23/2020 0412 Gross per 24 hour  Intake --  Output 650 ml  Net -650 ml   Filed Weights   01/22/20 0737 01/23/20 0404  Weight: 68 kg 56.2 kg    Examination:  General exam: Appears calm and comfortable  Respiratory system: decreased breath sounds b/l. No wheezes  Cardiovascular system: S1 & S2 +.  Systolic murmur 3/6. No rubs, gallops or clicks.  Gastrointestinal system: Abdomen is nondistended, soft and nontender. Hypoactive bowel sounds heard. Central nervous system: awake but not oriented at all  Psychiatry: Judgement and insight appear abnormal. Flat mood and affect.     Data Reviewed: I have personally reviewed following labs and imaging studies  CBC: Recent Labs  Lab 01/22/20 0728 01/23/20 0110  WBC 7.4 9.7  HGB 11.8* 11.4*  HCT 35.8* 34.0*  MCV 96.8 95.5  PLT 224 [id number]   Basic Metabolic Panel: Recent Labs  Lab 01/22/20 0728 01/23/20 0110  NA 138 139  K 4.9 3.7  CL 101 100  CO2 22 24  GLUCOSE 155* 124*  BUN 21 26*  CREATININE 1.10* 1.29*  CALCIUM 9.2 9.0  MG  --  1.7   GFR: Estimated Creatinine Clearance: 27.3 mL/min (A) (by C-G formula based on SCr of 1.29 mg/dL (H)). Liver Function Tests: No results for input(s): AST, ALT, ALKPHOS, BILITOT, PROT, ALBUMIN in  the last 168 hours. No results for input(s): LIPASE, AMYLASE in the last 168 hours. No results for input(s): AMMONIA in the last 168 hours. Coagulation Profile: Recent Labs  Lab 01/22/20 1209 01/23/20 0110  INR 2.9* 2.5*   Cardiac Enzymes: Recent Labs  Lab 01/22/20 0728  CKTOTAL 270*   BNP (last 3 results) No results for input(s): PROBNP in the last 8760 hours. HbA1C: No results for input(s): HGBA1C in the last 72 hours. CBG: No results for input(s): GLUCAP in the last 168 hours. Lipid Profile: Recent Labs    01/23/20 0110  CHOL 157  HDL 31*  LDLCALC 107*  TRIG  95  CHOLHDL 5.1   Thyroid Function Tests: No results for input(s): TSH, T4TOTAL, FREET4, T3FREE, THYROIDAB in the last 72 hours. Anemia Panel: No results for input(s): VITAMINB12, FOLATE, FERRITIN, TIBC, IRON, RETICCTPCT in the last 72 hours. Sepsis Labs: No results for input(s): PROCALCITON, LATICACIDVEN in the last 168 hours.  Recent Results (from the past 240 hour(s))  SARS Coronavirus 2 by RT PCR (hospital order, performed in [facility] hospital lab) Nasopharyngeal Nasopharyngeal Swab     Status: None   Collection Time: 01/22/20 12:09 PM   Specimen: Nasopharyngeal Swab  Result Value Ref Range Status   SARS Coronavirus 2 NEGATIVE NEGATIVE Final    Comment: (NOTE) SARS-CoV-2 target nucleic acids are NOT DETECTED. The SARS-CoV-2 RNA is generally detectable in upper and lower respiratory specimens during the acute phase of infection. The lowest concentration of SARS-CoV-2 viral copies this assay can detect is 250 copies / mL. A negative result does not preclude SARS-CoV-2 infection and should not be used as the sole basis for treatment or other patient management decisions.  A negative result may occur with improper specimen collection / handling, submission of specimen other than nasopharyngeal swab, presence of viral mutation(s) within the areas targeted by this assay, and inadequate number of viral copies (<250 copies / mL). A negative result must be combined with clinical observations, patient history, and epidemiological information. Fact Sheet for Patients:   StrictlyIdeas.no Fact Sheet for Healthcare Providers: BankingDealers.co.za This test is not yet approved or cleared  by the [place] FDA and has been authorized for detection and/or diagnosis of SARS-CoV-2 by FDA under an Emergency Use Authorization (EUA).  This EUA will remain in effect (meaning this test can be used) for the duration of the COVID-19 declaration  under Section 564(b)(1) of the Act, 21 U.S.C. section 360bbb-3(b)(1), unless the authorization is terminated or revoked sooner. Performed at [facility], [address] [place], [place] [address]   MRSA PCR Screening     Status: None   Collection Time: 01/22/20  2:48 PM   Specimen: Nasal Mucosa; Nasopharyngeal  Result Value Ref Range Status   MRSA by PCR NEGATIVE NEGATIVE Final    Comment:        The GeneXpert MRSA Assay (FDA approved for NASAL specimens only), is one component of a comprehensive MRSA colonization surveillance program. It is not intended to diagnose MRSA infection nor to guide or monitor treatment for MRSA infections. Performed at [facility], [address] [place], [place] [address]          Radiology Studies: DG Shoulder 1V Left  Result Date: 01/22/2020 CLINICAL DATA:  Recent fall with shoulder pain, initial encounter EXAM: LEFT SHOULDER COMPARISON:  None. FINDINGS: There is no evidence of fracture or dislocation. There is no evidence of arthropathy or other focal bone abnormality. Soft tissues are unremarkable.  IMPRESSION: No acute abnormality noted on this single view of the left shoulder. Electronically Signed   By: Inez Catalina M.D.   On: 01/22/2020 13:46   CT Head Wo Contrast  Result Date: 01/22/2020 CLINICAL DATA:  Recent fall, initial encounter EXAM: CT HEAD WITHOUT CONTRAST TECHNIQUE: Contiguous axial images were obtained from the base of the skull through the vertex without intravenous contrast. COMPARISON:  10/13/2019 FINDINGS: Brain: No evidence of acute infarction, hemorrhage, hydrocephalus, extra-axial collection or mass lesion/mass effect. Chronic atrophic and ischemic changes are again seen. Vascular: No hyperdense vessel or unexpected calcification. Skull: Normal. Negative for fracture or focal lesion. Sinuses/Orbits: No acute finding. Other: None. IMPRESSION: Chronic atrophic and ischemic changes without acute  abnormality. Electronically Signed   By: Inez Catalina M.D.   On: 01/22/2020 08:14   DG Chest Portable 1 View  Result Date: 01/22/2020 CLINICAL DATA:  Recent fall EXAM: PORTABLE CHEST 1 VIEW COMPARISON:  10/13/2019 FINDINGS: Cardiac shadow is enlarged but stable. Pacing device is again seen. Aortic calcifications are noted. Diffuse vascular congestion is again identified and stable. Mild parenchymal edema is noted. No focal infiltrate or sizable effusion is seen. No bony abnormality is noted. IMPRESSION: Vascular congestion and mild interstitial edema consistent with early CHF. Electronically Signed   By: Inez Catalina M.D.   On: 01/22/2020 10:48        Scheduled Meds: . calcium carbonate  600 mg of elemental calcium Oral Q breakfast  . carvedilol  6.25 mg Oral BID WC  . citalopram  20 mg Oral Daily  . donepezil  10 mg Oral QHS  . DULoxetine  30 mg Oral Daily  . feeding supplement (ENSURE ENLIVE)  237 mL Oral BID BM  . gabapentin  200 mg Oral TID  . lamoTRIgine  50 mg Oral Daily  . levothyroxine  25 mcg Oral QAC breakfast  . losartan  50 mg Oral Daily  . melatonin  2.5 mg Oral QHS  . nitrofurantoin (macrocrystal-monohydrate)  100 mg Oral BID  . QUEtiapine  12.5 mg Oral BID  . sodium chloride flush  3 mL Intravenous Q12H  . vitamin B-12  1,000 mcg Oral Daily   Continuous Infusions: . sodium chloride       LOS: 0 days    Time spent: 35 mins    Wyvonnia Dusky, MD [facility] Pager [phone]  If 7PM-7AM, please contact night-coverage [email or web address] 01/23/2020, 7:57 AM

## 2020-01-23 NOTE — Plan of Care (Signed)
  Problem: Activity: Goal: Capacity to carry out activities will improve Outcome: Progressing   Problem: Clinical Measurements: Goal: Will remain free from infection Outcome: Progressing   Problem: Pain Managment: Goal: General experience of comfort will improve Outcome: Progressing   Problem: Safety: Goal: Ability to remain free from injury will improve Outcome: Progressing   Problem: Skin Integrity: Goal: Risk for impaired skin integrity will decrease Outcome: Progressing

## 2020-01-23 NOTE — Consult Note (Addendum)
[place] for Warfarin Indication: a fib  Allergies  Allergen Reactions  . Naproxen     Other reaction(s): Other (See Comments) GI upset  . Sucralfate     Other reaction(s): Other (See Comments) Abdominal bloating  . Prednisone Anxiety    Shakes    Patient Measurements: Height: 5\' 3"  (160 cm) Weight: 56.2 kg (123 lb 12.8 oz) IBW/kg (Calculated) : 52.4  Vital Signs: Temp: 97.8 F (36.6 C) (05/23 0404) Temp Source: Oral (05/23 0404) BP: 168/98 (05/23 0734) Pulse Rate: 80 (05/23 0734)  Labs: Recent Labs    01/22/20 0728 01/22/20 0728 01/22/20 1209 01/22/20 1451 01/22/20 1805 01/23/20 0110 01/23/20 0327  HGB 11.8*  --   --   --   --  11.4*  --   HCT 35.8*  --   --   --   --  34.0*  --   PLT 224  --   --   --   --  219  --   LABPROT  --   --  29.0*  --   --  26.2*  --   INR  --   --  2.9*  --   --  2.5*  --   CREATININE 1.10*  --   --   --   --  1.29*  --   CKTOTAL 270*  --   --   --   --   --   --   TROPONINIHS 52*   < > 89*   < > 130* 195* 186*   < > = values in this interval not displayed.    Estimated Creatinine Clearance: 27.3 mL/min (A) (by C-G formula based on SCr of 1.29 mg/dL (H)).   Medical History: Past Medical History:  Diagnosis Date  . Anemia   . Anxiety   . Arthritis   . Breast cancer ([place]) 2011   rt- radiation  . Cancer [facility]) 2011   rt breast  . Depression   . Dysrhythmia    CHB s/p PPM, afib history  . GERD (gastroesophageal reflux disease)   . Heart murmur   . Hypertension   . Personal history of radiation therapy   . Presence of permanent cardiac pacemaker     Medications:  Warfarin pta dosing 5mg  M-F and 7.5mg  S/Sun. Last dose taken pta 5/21 @ 1700 (5mg )  Assessment: Pharmacy has been consulted to continue warfarin therapy for a fib.    INR Warfarin dose 05/22 1209  2.9 -- 05/23 0110 2.5  Goal of Therapy:  INR 2-3 Monitor platelets by anticoagulation protocol: Yes   Plan:  No  order for warfarin 5/22. Will continue with patient's home dose of warfarin 7.5 mg tonight.  Pharmacy will continue to follow and adjust dose per INR.  Tawnya Crook, PharmD Clinical Pharmacist 01/23/2020 9:54 AM

## 2020-01-24 DIAGNOSIS — Z7189 Other specified counseling: Secondary | ICD-10-CM

## 2020-01-24 DIAGNOSIS — J9601 Acute respiratory failure with hypoxia: Secondary | ICD-10-CM

## 2020-01-24 DIAGNOSIS — R627 Adult failure to thrive: Secondary | ICD-10-CM

## 2020-01-24 DIAGNOSIS — Z66 Do not resuscitate: Secondary | ICD-10-CM

## 2020-01-24 DIAGNOSIS — F0390 Unspecified dementia without behavioral disturbance: Secondary | ICD-10-CM

## 2020-01-24 DIAGNOSIS — I5040 Unspecified combined systolic (congestive) and diastolic (congestive) heart failure: Secondary | ICD-10-CM

## 2020-01-24 DIAGNOSIS — Z515 Encounter for palliative care: Secondary | ICD-10-CM

## 2020-01-24 LAB — BASIC METABOLIC PANEL
Anion gap: 14 (ref 5–15)
BUN: 26 mg/dL — ABNORMAL HIGH (ref 8–23)
CO2: 29 mmol/L (ref 22–32)
Calcium: 9.1 mg/dL (ref 8.9–10.3)
Chloride: 97 mmol/L — ABNORMAL LOW (ref 98–111)
Creatinine, Ser: 1.04 mg/dL — ABNORMAL HIGH (ref 0.44–1.00)
GFR calc Af Amer: 58 mL/min — ABNORMAL LOW (ref >60)
GFR calc non Af Amer: 50 mL/min — ABNORMAL LOW (ref >60)
Glucose, Bld: 99 mg/dL (ref 70–99)
Potassium: 3.1 mmol/L — ABNORMAL LOW (ref 3.5–5.1)
Sodium: 140 mmol/L (ref 135–145)

## 2020-01-24 LAB — PROTIME-INR
INR: 2.3 — ABNORMAL HIGH (ref 0.8–1.2)
Prothrombin Time: 24.7 seconds — ABNORMAL HIGH (ref 11.4–15.2)

## 2020-01-24 LAB — CBC
HCT: 38.8 % (ref 36.0–46.0)
Hemoglobin: 12.8 g/dL (ref 12.0–15.0)
MCH: 31.8 pg (ref 26.0–34.0)
MCHC: 33 g/dL (ref 30.0–36.0)
MCV: 96.5 fL (ref 80.0–100.0)
Platelets: 217 10*3/uL (ref 150–400)
RBC: 4.02 MIL/uL (ref 3.87–5.11)
RDW: 14.9 % (ref 11.5–15.5)
WBC: 9.3 10*3/uL (ref 4.0–10.5)
nRBC: 0 % (ref 0.0–0.2)

## 2020-01-24 MED ORDER — WARFARIN SODIUM 5 MG PO TABS
5.0000 mg | ORAL_TABLET | Freq: Once | ORAL | Status: AC
  Administered 2020-01-24: 5 mg via ORAL
  Filled 2020-01-24: qty 1

## 2020-01-24 MED ORDER — POTASSIUM CHLORIDE CRYS ER 20 MEQ PO TBCR
40.0000 meq | EXTENDED_RELEASE_TABLET | Freq: Two times a day (BID) | ORAL | Status: AC
  Administered 2020-01-24 (×2): 40 meq via ORAL
  Filled 2020-01-24 (×2): qty 2

## 2020-01-24 NOTE — TOC Progression Note (Signed)
Transition of Care [facility]) - Progression Note    Patient Details  Name: MCKAY NOTZ MRN: [id number] Date of Birth: 20-Apr-1934  [facility] CM/SW [place], RN Phone Number: 01/24/2020, 1:00 PM  Clinical Narrative:      Referral made to [facility] for hospice home.  Spoke with Earnestine Mealing, RN, she is reviewing chart for criteria and will be meeting with family shortly.     Expected Discharge Plan: [place]    Expected Discharge Plan and Services Expected Discharge Plan: [place] In-house Referral: Clinical Social Work   Post Acute Care Choice: [place] Living arrangements for the past 2 months: Assisted Living Facility([facility] - Memory Care Unit.)                                       Social Determinants of Health (SDOH) Interventions    Readmission Risk Interventions No flowsheet data found.

## 2020-01-24 NOTE — Progress Notes (Signed)
PROGRESS NOTE    Sally Carroll  [id number] DOB: Sep 17, 1933 DOA: 01/22/2020 PCP: Tracie Harrier, MD      Assessment & Plan:   Principal Problem:   Acute on chronic systolic CHF (congestive heart failure) ([place]) Active Problems:   Anxiety and depression   A-fib (HCC)   Hypertension   Hypothyroid   CKD (chronic kidney disease), stage IIIa   Dementia ([place])   Acute respiratory failure with hypoxia ([place])   Fall   CHF (congestive heart failure) (HCC) Failure to thrive: severe dementia, refusing to eat or drink, acute on chronic combined CHF, & respiratory failure. Pt is unable to complete any ADLs. Unable to ambulate  Acute hypoxic respiratory failure: secondary to acute on chronic systolic CHF exacerbation. Continue on supplemental oxygen and wean as tolerated   Acute on chronic systolic CHF exacerbation: echo on 09/16/2018 showed EF of 30%. Continue on IV lasix. Strict I/Os & daily weights. Neg fluid balance. Echo 01/2020 shows EF 20-25%, global hypokinesis, grade III diastolic dysfunction, severe aortic stenosis. Obtain REDs Vest reading is 38.   Hypokalemia: KCl repleted. Will continue to monitor  Dementia: continue donepezil. Not oriented to person, place or time   Depression and anxiety: no suicidal or homicidal ideations. Severity unknown. Continue on home dose of cymbalta, celexa, lamictal, ativan, seroquel  Chronic a. fib: continue warfarin and coreg. INR is therapeutic   HTN: continue on home dose of coreg and cozaar. Hydralazine prn  Hypothyroidism: continue on home dose of synthroid  CKDIIIa: stable. Will continue to monitor   Fall: CT head negative. Chronic left shoulder pain. XR left shoulder shows neg fracture or dislocation. Morphine, tramadol prn    DVT prophylaxis: warfarin Code Status: DNR Family Communication:  Hospice will evaluate pt and discuss w/ pt's family  Disposition Plan: possibly inpatient hospice    Consultants:       Procedures:   Antimicrobials:   Subjective: Pt is still not able to answer questions appropriately   Objective: Vitals:   01/23/20 0638 01/23/20 0734 01/23/20 1941 01/24/20 0434  BP: (!) 175/80 (!) 168/98 (!) 157/87 (!) 178/95  Pulse: 76 80 75 75  Resp:   20 20  Temp:   98.7 F (37.1 C) 98.4 F (36.9 C)  TempSrc:   Oral Oral  SpO2:   98% 100%  Weight:      Height:        Intake/Output Summary (Last 24 hours) at 01/24/2020 0754 Last data filed at 01/24/2020 0443 Gross per 24 hour  Intake 0 ml  Output 1950 ml  Net -1950 ml   Filed Weights   01/22/20 0737 01/23/20 0404  Weight: 68 kg 56.2 kg    Examination:  General exam: Appears calm and comfortable  Respiratory system: diminished breath sounds b/l. No rhonchi Cardiovascular system: S1 & S2 +.  Systolic murmur 3/6. No rubs, gallops or clicks.  Gastrointestinal system: Abdomen is nondistended, soft and nontender. Hypoactive bowel sounds heard. Central nervous system: awake but not oriented at all  Psychiatry: Judgement and insight appear abnormal. Flat mood and affect.     Data Reviewed: I have personally reviewed following labs and imaging studies  CBC: Recent Labs  Lab 01/22/20 0728 01/23/20 0110 01/24/20 0454  WBC 7.4 9.7 9.3  HGB 11.8* 11.4* 12.8  HCT 35.8* 34.0* 38.8  MCV 96.8 95.5 96.5  PLT 224 219 [id number]   Basic Metabolic Panel: Recent Labs  Lab 01/22/20 0728 01/23/20 0110 01/24/20 0454  NA 138 139 140  K 4.9 3.7 3.1*  CL 101 100 97*  CO2 22 24 29   GLUCOSE 155* 124* 99  BUN 21 26* 26*  CREATININE 1.10* 1.29* 1.04*  CALCIUM 9.2 9.0 9.1  MG  --  1.7  --    GFR: Estimated Creatinine Clearance: 33.9 mL/min (A) (by C-G formula based on SCr of 1.04 mg/dL (H)). Liver Function Tests: No results for input(s): AST, ALT, ALKPHOS, BILITOT, PROT, ALBUMIN in the last 168 hours. No results for input(s): LIPASE, AMYLASE in the last 168 hours. No results for input(s): AMMONIA in the last 168  hours. Coagulation Profile: Recent Labs  Lab 01/22/20 1209 01/23/20 0110 01/24/20 0454  INR 2.9* 2.5* 2.3*   Cardiac Enzymes: Recent Labs  Lab 01/22/20 0728  CKTOTAL 270*   BNP (last 3 results) No results for input(s): PROBNP in the last 8760 hours. HbA1C: Recent Labs    01/23/20 0110  HGBA1C 5.5   CBG: No results for input(s): GLUCAP in the last 168 hours. Lipid Profile: Recent Labs    01/23/20 0110  CHOL 157  HDL 31*  LDLCALC 107*  TRIG 95  CHOLHDL 5.1   Thyroid Function Tests: No results for input(s): TSH, T4TOTAL, FREET4, T3FREE, THYROIDAB in the last 72 hours. Anemia Panel: No results for input(s): VITAMINB12, FOLATE, FERRITIN, TIBC, IRON, RETICCTPCT in the last 72 hours. Sepsis Labs: No results for input(s): PROCALCITON, LATICACIDVEN in the last 168 hours.  Recent Results (from the past 240 hour(s))  SARS Coronavirus 2 by RT PCR (hospital order, performed in [facility] hospital lab) Nasopharyngeal Nasopharyngeal Swab     Status: None   Collection Time: 01/22/20 12:09 PM   Specimen: Nasopharyngeal Swab  Result Value Ref Range Status   SARS Coronavirus 2 NEGATIVE NEGATIVE Final    Comment: (NOTE) SARS-CoV-2 target nucleic acids are NOT DETECTED. The SARS-CoV-2 RNA is generally detectable in upper and lower respiratory specimens during the acute phase of infection. The lowest concentration of SARS-CoV-2 viral copies this assay can detect is 250 copies / mL. A negative result does not preclude SARS-CoV-2 infection and should not be used as the sole basis for treatment or other patient management decisions.  A negative result may occur with improper specimen collection / handling, submission of specimen other than nasopharyngeal swab, presence of viral mutation(s) within the areas targeted by this assay, and inadequate number of viral copies (<250 copies / mL). A negative result must be combined with clinical observations, patient history, and  epidemiological information. Fact Sheet for Patients:   StrictlyIdeas.no Fact Sheet for Healthcare Providers: BankingDealers.co.za This test is not yet approved or cleared  by the [place] FDA and has been authorized for detection and/or diagnosis of SARS-CoV-2 by FDA under an Emergency Use Authorization (EUA).  This EUA will remain in effect (meaning this test can be used) for the duration of the COVID-19 declaration under Section 564(b)(1) of the Act, 21 U.S.C. section 360bbb-3(b)(1), unless the authorization is terminated or revoked sooner. Performed at [facility], [address] [place], [place] [address]   MRSA PCR Screening     Status: None   Collection Time: 01/22/20  2:48 PM   Specimen: Nasal Mucosa; Nasopharyngeal  Result Value Ref Range Status   MRSA by PCR NEGATIVE NEGATIVE Final    Comment:        The GeneXpert MRSA Assay (FDA approved for NASAL specimens only), is one component of a comprehensive MRSA colonization surveillance program. It is not intended to diagnose MRSA  infection nor to guide or monitor treatment for MRSA infections. Performed at [facility], [address], [place], [place] [address]          Radiology Studies: DG Shoulder 1V Left  Result Date: 01/22/2020 CLINICAL DATA:  Recent fall with shoulder pain, initial encounter EXAM: LEFT SHOULDER COMPARISON:  None. FINDINGS: There is no evidence of fracture or dislocation. There is no evidence of arthropathy or other focal bone abnormality. Soft tissues are unremarkable. IMPRESSION: No acute abnormality noted on this single view of the left shoulder. Electronically Signed   By: Inez Catalina M.D.   On: 01/22/2020 13:46   DG Chest Portable 1 View  Result Date: 01/22/2020 CLINICAL DATA:  Recent fall EXAM: PORTABLE CHEST 1 VIEW COMPARISON:  10/13/2019 FINDINGS: Cardiac shadow is enlarged but stable. Pacing device is again seen.  Aortic calcifications are noted. Diffuse vascular congestion is again identified and stable. Mild parenchymal edema is noted. No focal infiltrate or sizable effusion is seen. No bony abnormality is noted. IMPRESSION: Vascular congestion and mild interstitial edema consistent with early CHF. Electronically Signed   By: Inez Catalina M.D.   On: 01/22/2020 10:48   ECHOCARDIOGRAM COMPLETE  Result Date: 01/23/2020    ECHOCARDIOGRAM REPORT   Patient Name:   Sally Carroll Date of Exam: 01/23/2020 Medical Rec #:  [id number]        Height:       63.0 in Accession #:    [id number]       Weight:       123.8 lb Date of Birth:  [birth year]        BSA:          1.577 m Patient Age:    28 years         BP:           168/98 mmHg Patient Gender: F                HR:           80 bpm. Exam Location:  [facility] Procedure: 2D Echo, Cardiac Doppler and Color Doppler Indications:     CHF- acute diastolic [id number]  History:         Patient has no prior history of Echocardiogram examinations.                  Pacemaker, Signs/Symptoms:Murmur; Risk Factors:Hypertension.  Sonographer:     Sherrie Sport RDCS (AE) Referring Phys:  [id number] Soledad Gerlach NIU Diagnosing Phys: Kate Sable MD IMPRESSIONS  1. Left ventricular ejection fraction, by estimation, is 20 to 25%. The left ventricle has severely decreased function. The left ventricle demonstrates global hypokinesis. The left ventricular internal cavity size was mildly dilated. Left ventricular diastolic parameters are consistent with Grade III diastolic dysfunction (restrictive).  2. Right ventricular systolic function is moderately reduced. The right ventricular size is mildly enlarged. There is severely elevated pulmonary artery systolic pressure.  3. Left atrial size was severely dilated.  4. Right atrial size was severely dilated.  5. The mitral valve is normal in structure. Mild to moderate mitral valve regurgitation.  6. Low flow , low gradient severe Aortic stenosis. DVI = 0.12     . Unable to  determine valve morphology due to image quality. Aortic valve regurgitation is trivial. Severe aortic valve stenosis. Aortic valve area, by VTI measures 0.39 cm. Aortic valve mean gradient measures 23.7 mmHg. Aortic valve Vmax measures 3.19 m/s.  7. The inferior vena cava is normal  in size with greater than 50% respiratory variability, suggesting right atrial pressure of 3 mmHg. FINDINGS  Left Ventricle: Left ventricular ejection fraction, by estimation, is 20 to 25%. The left ventricle has severely decreased function. The left ventricle demonstrates global hypokinesis. The left ventricular internal cavity size was mildly dilated. There is no left ventricular hypertrophy. Left ventricular diastolic parameters are consistent with Grade III diastolic dysfunction (restrictive). Right Ventricle: The right ventricular size is mildly enlarged. No increase in right ventricular wall thickness. Right ventricular systolic function is moderately reduced. There is severely elevated pulmonary artery systolic pressure. The tricuspid regurgitant velocity is 4.07 m/s, and with an assumed right atrial pressure of 3 mmHg, the estimated right ventricular systolic pressure is 99991111 mmHg. Left Atrium: Left atrial size was severely dilated. Right Atrium: Right atrial size was severely dilated. Pericardium: There is no evidence of pericardial effusion. Mitral Valve: The mitral valve is normal in structure. Mild to moderate mitral valve regurgitation. Tricuspid Valve: The tricuspid valve is normal in structure. Tricuspid valve regurgitation is mild. Aortic Valve: Low flow , low gradient severe Aortic stenosis. DVI = 0.12. Unable to determine valve morphology due to image quality. Aortic valve regurgitation is trivial. Aortic regurgitation PHT measures 538 msec. Severe aortic stenosis is present. Aortic valve mean gradient measures 23.7 mmHg. Aortic valve peak gradient measures 40.7 mmHg. Aortic valve area, by VTI measures 0.39 cm.  Pulmonic Valve: The pulmonic valve was not well visualized. Pulmonic valve regurgitation is not visualized. Aorta: The aortic root is normal in size and structure. Venous: The inferior vena cava is normal in size with greater than 50% respiratory variability, suggesting right atrial pressure of 3 mmHg. IAS/Shunts: No atrial level shunt detected by color flow Doppler. Additional Comments: A pacer wire is visualized.  LEFT VENTRICLE PLAX 2D LVIDd:         5.61 cm LVIDs:         4.87 cm LV PW:         1.70 cm LV IVS:        1.08 cm LVOT diam:     2.00 cm LV SV:         24 LV SV Index:   15 LVOT Area:     3.14 cm  LV Volumes (MOD) LV vol d, MOD A2C: 128.0 ml LV vol d, MOD A4C: 116.0 ml LV vol s, MOD A2C: 99.9 ml LV vol s, MOD A4C: 80.0 ml LV SV MOD A2C:     28.1 ml LV SV MOD A4C:     116.0 ml LV SV MOD BP:      29.5 ml RIGHT VENTRICLE RV Basal diam:  4.12 cm RV S prime:     11.10 cm/s TAPSE (M-mode): 3.6 cm LEFT ATRIUM              Index       RIGHT ATRIUM           Index LA diam:        5.80 cm  3.68 cm/m  RA Area:     22.80 cm LA Vol (A2C):   119.0 ml 75.46 ml/m RA Volume:   71.80 ml  45.53 ml/m LA Vol (A4C):   155.0 ml 98.29 ml/m LA Biplane Vol: 136.0 ml 86.24 ml/m  AORTIC VALVE                    PULMONIC VALVE AV Area (Vmax):    0.46 cm     PV  Vmax:        0.92 m/s AV Area (Vmean):   0.41 cm     PV Peak grad:   3.4 mmHg AV Area (VTI):     0.39 cm     RVOT Peak grad: 7 mmHg AV Vmax:           319.00 cm/s AV Vmean:          229.333 cm/s AV VTI:            0.615 m AV Peak Grad:      40.7 mmHg AV Mean Grad:      23.7 mmHg LVOT Vmax:         46.30 cm/s LVOT Vmean:        29.800 cm/s LVOT VTI:          0.077 m LVOT/AV VTI ratio: 0.12 AI PHT:            538 msec  AORTA Ao Root diam: 2.90 cm MITRAL VALVE                TRICUSPID VALVE MV Area (PHT): 5.50 cm     TR Peak grad:   66.3 mmHg MV Decel Time: 138 msec     TR Vmax:        407.00 cm/s MV E velocity: 103.00 cm/s MV A velocity: 35.60 cm/s   SHUNTS MV E/A  ratio:  2.89         Systemic VTI:  0.08 m                             Systemic Diam: 2.00 cm Kate Sable MD Electronically signed by Kate Sable MD Signature Date/Time: 01/23/2020/12:01:20 PM    Final         Scheduled Meds: . calcium carbonate  600 mg of elemental calcium Oral Q breakfast  . carvedilol  6.25 mg Oral BID WC  . citalopram  20 mg Oral Daily  . donepezil  10 mg Oral QHS  . DULoxetine  30 mg Oral Daily  . feeding supplement (ENSURE ENLIVE)  237 mL Oral BID BM  . furosemide  40 mg Intravenous Daily  . gabapentin  200 mg Oral TID  . lamoTRIgine  50 mg Oral Daily  . levothyroxine  25 mcg Oral QAC breakfast  . losartan  50 mg Oral Daily  . melatonin  2.5 mg Oral QHS  . nitrofurantoin (macrocrystal-monohydrate)  100 mg Oral BID  . QUEtiapine  12.5 mg Oral BID  . sodium chloride flush  3 mL Intravenous Q12H  . vitamin B-12  1,000 mcg Oral Daily  . warfarin  5 mg Oral ONCE-1600  . Warfarin - Pharmacist Dosing Inpatient   Does not apply q1600   Continuous Infusions: . sodium chloride       LOS: 1 day    Time spent: 30 mins    Wyvonnia Dusky, MD [facility] Pager [phone]  If 7PM-7AM, please contact night-coverage [email or web address] 01/24/2020, 7:54 AM

## 2020-01-24 NOTE — Progress Notes (Signed)
[facility]) Hospital Liaison RN note  Received request from [place] for hospice services at [facility] after discharge. Chart and patient information under review by [facility] physician. Hospice eligibility pending at this time.  Spoke with [facility]) to initiate education related to hospice philosophy and services. Answered questions. Sally Carroll verbalized understanding of information given. Plan will be for EMS at time of discharge, which is not decided at this time.  DME needs discussed. Sally Carroll is requesting hospital bed with 1/2 rails, OBT, 3 in 1, shower bench and wheelchair. She has asked that we coordinate with [facility] for DME needs as well. Spoke with Roseanne Reno who is reaching out to [facility] to determine needs and then will share with [facility] for ordering.  Please send signed and completed DNR with patient at the time of discharge.   Please provide prescriptions at discharge as needed to ensure ongoing symptom management.  [facility] information and contact numbers given to [name]). Above information shared with Roseanne Reno with TOC.  Please call with any hospice related questions or concerns.  Thank you. Margaretmary Eddy, BSN, RN [facility] Liaison [phone]

## 2020-01-24 NOTE — Consult Note (Signed)
[place] for Warfarin Indication: a fib  Allergies  Allergen Reactions  . Naproxen     Other reaction(s): Other (See Comments) GI upset  . Sucralfate     Other reaction(s): Other (See Comments) Abdominal bloating  . Prednisone Anxiety    Shakes    Patient Measurements: Height: 5\' 3"  (160 cm) Weight: 56.2 kg (123 lb 12.8 oz) IBW/kg (Calculated) : 52.4  Vital Signs: Temp: 98.4 F (36.9 C) (05/24 0434) Temp Source: Oral (05/24 0434) BP: 178/95 (05/24 0434) Pulse Rate: 75 (05/24 0434)  Labs: Recent Labs    01/22/20 0728 01/22/20 0728 01/22/20 1209 01/22/20 1451 01/22/20 1805 01/23/20 0110 01/23/20 0327 01/24/20 0454  HGB 11.8*   < >  --   --   --  11.4*  --  12.8  HCT 35.8*  --   --   --   --  34.0*  --  38.8  PLT 224  --   --   --   --  219  --  217  LABPROT  --   --  29.0*  --   --  26.2*  --  24.7*  INR  --   --  2.9*  --   --  2.5*  --  2.3*  CREATININE 1.10*  --   --   --   --  1.29*  --  1.04*  CKTOTAL 270*  --   --   --   --   --   --   --   TROPONINIHS 52*   < > 89*   < > 130* 195* 186*  --    < > = values in this interval not displayed.    Estimated Creatinine Clearance: 33.9 mL/min (A) (by C-G formula based on SCr of 1.04 mg/dL (H)).   Medical History: Past Medical History:  Diagnosis Date  . Anemia   . Anxiety   . Arthritis   . Breast cancer ([place]) 2011   rt- radiation  . Cancer [facility]) 2011   rt breast  . Depression   . Dysrhythmia    CHB s/p PPM, afib history  . GERD (gastroesophageal reflux disease)   . Heart murmur   . Hypertension   . Personal history of radiation therapy   . Presence of permanent cardiac pacemaker     Medications:  Warfarin pta dosing 5mg  M-F and 7.5mg  S/Sun. Last dose taken pta 5/21 @ 1700 (5mg )  Assessment: Pharmacy has been consulted to continue warfarin therapy for a fib.    INR Warfarin dose 05/22 1209  2.9 -- 05/23 0110 2.5 7.5mg  05/24 0454  2.3    5mg   Goal of  Therapy:  INR 2-3 Monitor platelets by anticoagulation protocol: Yes   Plan:  Will continue with patient's home dose of warfarin 5 mg tonight.  Pharmacy will continue to follow and adjust dose per INR.  Lu Duffel, PharmD Clinical Pharmacist 01/24/2020 7:44 AM

## 2020-01-24 NOTE — Evaluation (Addendum)
Clinical/Bedside Swallow Evaluation Patient Details  Name: Sally Carroll MRN: [id number] Date of Birth: August 15, 1934  Today's Date: 01/24/2020 Time: SLP Start Time (ACUTE ONLY): 1030 SLP Stop Time (ACUTE ONLY): 1125 SLP Time Calculation (min) (ACUTE ONLY): 55 min  Past Medical History:  Past Medical History:  Diagnosis Date  . Anemia   . Anxiety   . Arthritis   . Breast cancer ([place]) 2011   rt- radiation  . Cancer [facility]) 2011   rt breast  . Depression   . Dysrhythmia    CHB s/p PPM, afib history  . GERD (gastroesophageal reflux disease)   . Heart murmur   . Hypertension   . Personal history of radiation therapy   . Presence of permanent cardiac pacemaker    Past Surgical History:  Past Surgical History:  Procedure Laterality Date  . BREAST BIOPSY Right 2011   right lumpectomy rad  . BREAST SURGERY Right    bx  . EYE SURGERY Bilateral    cataracts  . LUMBAR LAMINECTOMY/DECOMPRESSION MICRODISCECTOMY Right 02/10/2015   Procedure: Right Lumbar Four-Five Microdiskectomy;  Surgeon: Erline Levine, MD;  Location: [place] [facility];  Service: Neurosurgery;  Laterality: Right;  Right L4-5 Microdiskectomy  . PACEMAKER INSERTION     99.07   HPI:  Pt is a 86 y.o. female with a past medical history of Multiple medical issues including anxiety, anemia, depression, hypertension, Dementia, CKD, presents to the emergency department after a fall.  According to staff patient was found on the floor at her nursing facility.  This was an unwitnessed fall.  Patient has a history of dementia and cannot contribute to her history or review of systems.  Pt awakens intermittently to verbal/tactile cues; mumbled few words but cannot follow commands. Patient came in from [place] unit at [facility] and has been weaker w/ recent Fall per chart notes.  CXR revealed: "Vascular congestion and mild interstitial edema consistent with early CHF".  Pt is dependent for most ADLs at living facility per report.     Assessment / Plan / Recommendation Clinical Impression  Pt appears to present w/ primary Oral phase dysphagia impacted by Significant Cognitive decline; pt has a Baseline of Dementia. Pt is also dependent for most ADLs - including feeding at this evaluation. Any oral phase deficits and decreased awareness w/ swallowing tasks can increase risk for aspiration to occur. Aspiration precautions, modified diet consistency, and Supervision w/ oral intake/Feeding support given -- this appeared to reduce her risk for aspiration during this evaluation. However, during trials, pt exhibited such Cognitive decline requiring tactile cues of spoon at lips and Time, that trial consistencies were limited to Nectar liquids and purees, ice chips. No overt clinical s/s of aspiration noted during/post trials given both via spoon/cup w/ liquids, tsps of purees. No overt coughing or throat clearing noted which was noted w/ trials of thin liquids last admit during BSE; no decline in respiratory presentation during/post trials. Oral phase deficits noted impacted by Cognitive decline, Dementia. Pt exhibited oral prep and oral acceptance deficits c/b closed lips, turning of head, hesitation, and slow movements during bolus acceptance. Once po's were accepted, min slower bolus management was followed by timely A-P transfer and swallowing w/ trials given. Oral clearing was achieved w/ boluses given. OM exam was limited d/t Cognitive status; no unilateral weakness was apparent during bolus acceptance/management and no anterior leakage ocurred w/ trials. Mod-Max verbal/tactile cues given overall in order to promote and support oral intake -- this increases risk for  aspiration overall. Due to pt's presentation at this time, recommend a Dysphagia level 1 (puree) diet w/ Nectar liquids w/ Aspiration precautions; reduce distractions during meals. Feeding Support at meals. Pills in Puree -- Crushed for easier swallowing. ST services will  continue to f/u w/ pt's status and monitor toleration of diet/po's for possible upgrade of diet consistency. MD and NSG updated. Recommend Dietician f/u for support. Education given to family member present.  SLP Visit Diagnosis: Dysphagia, oral phase (R13.11);Dysphagia, oropharyngeal phase (R13.12)(baseline Dementia)    Aspiration Risk  Mild aspiration risk;Moderate aspiration risk;Risk for inadequate nutrition/hydration    Diet Recommendation  Dysphagia level 1 (PUREE) w/ NECTAR consistency liquids; strict aspiration precautions; feeding support at meals; reduce distractions at meals. Give po's only when engaged and alert.   Medication Administration: Crushed with puree(for safer swallowing)    Other  Recommendations Recommended Consults: (Dietician f/u; Palliative Care for [place]) Oral Care Recommendations: Oral care BID;Oral care before and after PO;Staff/trained caregiver to provide oral care Other Recommendations: Order thickener from pharmacy;Prohibited food (jello, ice cream, thin soups);Remove water pitcher;Have oral suction available   Follow up Recommendations Skilled Nursing facility      Frequency and Duration min 3x week  2 weeks       Prognosis Prognosis for Safe Diet Advancement: Fair Barriers to Reach Goals: Cognitive deficits;Time post onset;Severity of deficits;Behavior      Swallow Study   General Date of Onset: 01/22/20 HPI: Pt is a 86 y.o. female with a past medical history of Multiple medical issues including anxiety, anemia, depression, hypertension, Dementia, CKD, presents to the emergency department after a fall.  According to staff patient was found on the floor at her nursing facility.  This was an unwitnessed fall.  Patient has a history of dementia and cannot contribute to her history or review of systems.  Pt awakens intermittently to verbal/tactile cues; mumbled few words but cannot follow commands. Patient came in from [place] unit at [facility] and has  been weaker w/ recent Fall per chart notes.  CXR revealed: "Vascular congestion and mild interstitial edema consistent with early CHF".  Pt is dependent for most ADLs at living facility per report.  Type of Study: Bedside Swallow Evaluation Previous Swallow Assessment: 10/14/2019 Diet Prior to this Study: Dysphagia 1 (puree);Thin liquids Temperature Spikes Noted: No(wbc 9.3) Respiratory Status: Nasal cannula(2L) History of Recent Intubation: No Behavior/Cognition: Alert;Cooperative;Pleasant mood;Confused;Distractible;Requires cueing;Doesn't follow directions(Drowsy) Oral Cavity Assessment: (unable to fully assess d/t Cognitive status) Oral Care Completed by SLP: Yes(attempted) Oral Cavity - Dentition: Adequate natural dentition Vision: (n/a) Self-Feeding Abilities: Total assist Patient Positioning: Upright in bed(full positioning support given) Baseline Vocal Quality: (mumbled few words) Volitional Cough: Cognitively unable to elicit Volitional Swallow: Unable to elicit    Oral/Motor/Sensory Function Overall Oral Motor/Sensory Function: (unable to fully assess d/t Cognitive status; ok w/ po's)   Ice Chips Ice chips: Impaired Presentation: Spoon(fed; 5 trials) Oral Phase Impairments: Poor awareness of bolus Oral Phase Functional Implications: Prolonged oral transit(min) Pharyngeal Phase Impairments: (none noted)   Thin Liquid Thin Liquid: Not tested Other Comments: unable to fully assess d/t Cognitive status    Nectar Thick Nectar Thick Liquid: Impaired Presentation: Spoon(fed; 10 trials) Oral Phase Impairments: Poor awareness of bolus Oral phase functional implications: (none) Pharyngeal Phase Impairments: (none) Other Comments: would not open mouth/lips to po presentation at times d/t Cognitive status   Honey Thick Honey Thick Liquid: Not tested   Puree Puree: Impaired Presentation: Spoon(fed; 10 trials) Oral Phase Impairments: Poor  awareness of bolus Oral Phase Functional  Implications: (none) Pharyngeal Phase Impairments: (none) Other Comments: would not open mouth/lips to po presentation at times d/t Cognitive status   Solid     Solid: Not tested Other Comments: d/t Cognitive status       Orinda Kenner, MS, CCC-SLP Luke Falero 01/24/2020,2:28 PM

## 2020-01-24 NOTE — Consult Note (Signed)
Consultation Note Date: 01/24/2020   Patient Name: Sally Carroll  DOB: February 23, 1934  MRN: [id number]  Age / Sex: 86 y.o., female   PCP: Tracie Harrier, MD Referring Physician: Wyvonnia Dusky, MD   REASON FOR CONSULTATION:Establishing goals of care  Palliative Care consult requested for goals of care discussion in this 86 y.o. female with multiple medical problems including hypertension, hyperlipidemia, GERD, depression, pacemaker placement due to complete heart block, breast cancer (s/p of radiation therapy), anemia, atrial fibrillation on Coumadin, CKD-3, dementia, sCHF with EF 30%. She presented to ED from [facility] with complaints of frequent falls. Per notations patient was found to have an unwitnessed fall and found on the floor. No bruising or complaints of pain observed. During her ED work-up Chest x-ray showed vascular congestion, interstitial pulmonary edema.  CT of head is negative for acute intracranial abnormalities. Since admission patient seen by SLP with recommendations for dysphagia 1 diet with nectar liquids and strict aspiration precautions.   Clinical Assessment and Goals of Care: I have reviewed medical records including lab results, imaging, Epic notes, and MAR, received report from the bedside RN, and assessed the patient. I met at the bedside with patient's HCPOA Elon Jester to discuss diagnosis prognosis, GOC, EOL wishes, disposition and options. Olivia Mackie, RN with [facility] also at the bedside for discussions.   I introduced Palliative Medicine as specialized medical care for people living with serious illness. It focuses on providing relief from the symptoms and stress of a serious illness. The goal is to improve quality of life for both the patient and the family.  [name] was resting in bed. She was easily aroused on verbal stimuli. Was able to verbalize initially that she was having some discomfort however, unable to verbalize specific  location. She continued to verbalize incomprehensible statements. Offered drink however she only took a few sips and refused. Awake with eyes open during discussion with family.   [name]'s husband was initially at the bedside however, he left to go downstairs so that June, patient's niece could come up to visit although there was some confusion regarding multiple visitors in the setting of limited visitations due to [place].   We discussed a brief life review of the patient, along with her functional and nutritional status.   [name]'s husband passed away in 11/14/13. She retired from [employer] in [place] after working there for over 40 years. She did not have any children, however [name] shares patient and husband raised her since she was 66 years old and considered her like their daughter.   Patient has been a resident at [facility] for almost 3 years. Prior to her placement there she resided in the home with [place] and 24/7 caregivers. Family reports patient continued to get up by herself and have experience several falls over the past year. She enjoyed sitting up in the recliner. [name] reports patient's appetite has decreased but unsure the period of time. Family feels she has loss some weight but not a significant amount. Their contact has been limited due to [place] visitation restriction and only seeing patient via window.   We discussed Her current illness and what it means in the larger context of Her on-going co-morbidities. Natural disease trajectory and expectations at EOL were discussed.  Patient's niece June was calling via phone as well as patient's grandson ([name]'s son, Legrand Como- who works at [employer]). Family shared their concerns of patient not receiving one-on-one care and her frequent falls. We discussed  in detail what care looks like at all facilities compared to in the home with hired caregivers. Family's main concerns is patient's care and the ability for her to continue to thrive but with the  expected care. [name] tearful expressing patient was always "well put together" and took great pride in her appearance and care. Therapeutic listening provided.   Education provided on progression of dementia. [name] verbalized understanding and appreciation.   I attempted to elicit values and goals of care important to the patient.    The difference between aggressive medical intervention and comfort care was considered in light of the patient's goals of care. [name] presented patient's documented advanced directives. I reviewed the documents in detail, which indicated [name] as her POA, patient's wishes for DNR, and no life prolonging measures such as artificial feedings/PEG/ or HD. [name] verbalized understanding of patient's wishes as the document had been completed for some time and plans to honor her wishes.   We discussed rehospitalization. [name] reports she would not want patient to come back and forth to the hospital and wishes were to make sure she would be kept comfortable once discharge during what time she has left. Support given.   Education provided on the MOST form to document wishes for patient. MOST form today. [name] outlined wishes for the following treatment decisions:  Cardiopulmonary Resuscitation: Do Not Attempt Resuscitation (DNR/No CPR)  Medical Interventions: Comfort Measures: Keep clean, warm, and dry. Use medication by any route, positioning, wound care, and other measures to relieve pain and suffering. Use oxygen, suction and manual treatment of airway obstruction as needed for comfort. Do not transfer to the hospital unless comfort needs cannot be met in current location. With written notation for hospice to treat in place.  Antibiotics: No antibiotics (use other measures to relieve symptoms)  IV Fluids: No IV fluids (provide other measures to ensure comfort)  Feeding Tube: No feeding tube   Hospice and Palliative Care services outpatient were explained and offered. Family  verbalized their understanding and awareness of both palliative and hospice's goals and philosophy of care. Family is aware patient is not appropriate for residential hospice facility at this time and life expectancy is not considered 2 weeks or less, however she is appropriate for outpatient hospice support. They verbalized understanding and is requesting outpatient hospice. [name] is aware once patient becomes more appropriate for residential hospice she may discuss further with patient's outpatient medical team and consideration for transfer can be made.   [name] and other family via phone verbalizes understanding unless family hires private caregivers patient will not receive 24/7 one on one care. They discussed options of skilled or returning to her previous facility if accepted. [name] would like patient to return to [facility] if possible.   Questions and concerns were addressed.  Hard Choices booklet left for review. The family was encouraged to call with questions or concerns.  PMT will continue to support holistically as needed.   SOCIAL HISTORY:     reports that she has never smoked. She has never used smokeless tobacco. She reports that she does not drink alcohol or use drugs.  CODE STATUS: DNR  ADVANCE DIRECTIVES: Primary Decision Maker: Elon Jester (HCPOA)  Palliative Prophylaxis:   Aspiration, Delirium Protocol, Frequent Pain Assessment, Oral Care and Turn Reposition  PSYCHO-SOCIAL/SPIRITUAL:  Support System: Family Desire for further Chaplaincy support: No   Additional Recommendations (Limitations, Scope, Preferences):  Avoid Hospitalization, No Artificial Feeding, No IV Fluids and no aggressive interventions   PAST MEDICAL  HISTORY: Past Medical History:  Diagnosis Date  . Anemia   . Anxiety   . Arthritis   . Breast cancer ([place]) 2011   rt- radiation  . Cancer [facility]) 2011   rt breast  . Depression   . Dysrhythmia    CHB s/p PPM, afib history  . GERD (gastroesophageal  reflux disease)   . Heart murmur   . Hypertension   . Personal history of radiation therapy   . Presence of permanent cardiac pacemaker     PAST SURGICAL HISTORY:  Past Surgical History:  Procedure Laterality Date  . BREAST BIOPSY Right 2011   right lumpectomy rad  . BREAST SURGERY Right    bx  . EYE SURGERY Bilateral    cataracts  . LUMBAR LAMINECTOMY/DECOMPRESSION MICRODISCECTOMY Right 02/10/2015   Procedure: Right Lumbar Four-Five Microdiskectomy;  Surgeon: Erline Levine, MD;  Location: [facility];  Service: Neurosurgery;  Laterality: Right;  Right L4-5 Microdiskectomy  . PACEMAKER INSERTION     99.07    ALLERGIES:  is allergic to naproxen; sucralfate; and prednisone.   MEDICATIONS:  Current Facility-Administered Medications  Medication Dose Route Frequency Provider Last Rate Last Admin  . 0.9 %  sodium chloride infusion  250 mL Intravenous PRN Ivor Costa, MD      . acetaminophen (TYLENOL) tablet 650 mg  650 mg Oral Q6H PRN Ivor Costa, MD      . albuterol (PROVENTIL) (2.5 MG/3ML) 0.083% nebulizer solution 2.5 mg  2.5 mg Inhalation Q4H PRN Ivor Costa, MD      . calcium carbonate (TUMS - dosed in mg elemental calcium) chewable tablet 600 mg of elemental calcium  600 mg of elemental calcium Oral Q breakfast Ivor Costa, MD   600 mg of elemental calcium at 01/24/20 1007  . carvedilol (COREG) tablet 6.25 mg  6.25 mg Oral BID WC Ivor Costa, MD   6.25 mg at 01/24/20 1006  . citalopram (CELEXA) tablet 20 mg  20 mg Oral Daily Ivor Costa, MD   20 mg at 01/24/20 1007  . dextromethorphan-guaiFENesin (MUCINEX DM) 30-600 MG per 12 hr tablet 1 tablet  1 tablet Oral BID PRN Ivor Costa, MD      . docusate sodium (COLACE) capsule 100 mg  100 mg Oral Daily PRN Ivor Costa, MD      . donepezil (ARICEPT) tablet 10 mg  10 mg Oral QHS Ivor Costa, MD   10 mg at 01/23/20 2226  . DULoxetine (CYMBALTA) DR capsule 30 mg  30 mg Oral Daily Ivor Costa, MD   30 mg at 01/24/20 1005  . feeding supplement  (ENSURE ENLIVE) (ENSURE ENLIVE) liquid 237 mL  237 mL Oral BID BM Ivor Costa, MD   237 mL at 01/24/20 1406  . furosemide (LASIX) injection 40 mg  40 mg Intravenous Daily Wyvonnia Dusky, MD   40 mg at 01/24/20 1006  . gabapentin (NEURONTIN) capsule 200 mg  200 mg Oral TID Ivor Costa, MD   200 mg at 01/23/20 1423  . hydrALAZINE (APRESOLINE) injection 5 mg  5 mg Intravenous Q2H PRN Ivor Costa, MD   5 mg at 01/23/20 0640  . lamoTRIgine (LAMICTAL) tablet 50 mg  50 mg Oral Daily Ivor Costa, MD   50 mg at 01/24/20 1005  . levothyroxine (SYNTHROID) tablet 25 mcg  25 mcg Oral QAC breakfast Ivor Costa, MD   25 mcg at 01/24/20 [phone]  . LORazepam (ATIVAN) tablet 0.5 mg  0.5 mg Oral QHS PRN Ivor Costa, MD      .  losartan (COZAAR) tablet 50 mg  50 mg Oral Daily Ivor Costa, MD   50 mg at 01/24/20 1015  . melatonin tablet 2.5 mg  2.5 mg Oral QHS Ivor Costa, MD   2.5 mg at 01/23/20 2226  . morphine 2 MG/ML injection 1 mg  1 mg Intravenous Q4H PRN Wyvonnia Dusky, MD      . ondansetron [facility]) injection 4 mg  4 mg Intravenous Q8H PRN Ivor Costa, MD      . potassium chloride SA (KLOR-CON) CR tablet 40 mEq  40 mEq Oral BID Wyvonnia Dusky, MD   40 mEq at 01/24/20 1006  . QUEtiapine (SEROQUEL) tablet 12.5 mg  12.5 mg Oral BID Ivor Costa, MD   12.5 mg at 01/24/20 1006  . sodium chloride flush (NS) 0.9 % injection 3 mL  3 mL Intravenous Q12H Ivor Costa, MD   3 mL at 01/24/20 1008  . sodium chloride flush (NS) 0.9 % injection 3 mL  3 mL Intravenous PRN Ivor Costa, MD      . traMADol Veatrice Bourbon) tablet 50 mg  50 mg Oral Q12H PRN Wyvonnia Dusky, MD      . vitamin B-12 (CYANOCOBALAMIN) tablet 1,000 mcg  1,000 mcg Oral Daily Ivor Costa, MD   1,000 mcg at 01/24/20 1005  . warfarin (COUMADIN) tablet 5 mg  5 mg Oral ONCE-1600 [place], [facility], [facility]      . Warfarin - Pharmacist Dosing Inpatient   Does not apply q1600 Wyvonnia Dusky, MD   Given at 01/23/20 1423    VITAL SIGNS: BP (!) 142/85 (BP Location:  Right Arm)   Pulse 77   Temp 99.1 F (37.3 C) (Oral)   Resp 15   Ht '5\' 3"'  (1.6 m)   Wt 56.2 kg   SpO2 97%   BMI 21.93 kg/m  Filed Weights   01/22/20 0737 01/23/20 0404  Weight: 68 kg 56.2 kg    Estimated body mass index is 21.93 kg/m as calculated from the following:   Height as of this encounter: '5\' 3"'  (1.6 m).   Weight as of this encounter: 56.2 kg.  LABS: CBC:    Component Value Date/Time   WBC 9.3 01/24/2020 0454   HGB 12.8 01/24/2020 0454   HGB 11.9 (L) 12/25/2014 1914   HCT 38.8 01/24/2020 0454   HCT 34.4 (L) 12/25/2014 1914   PLT 217 01/24/2020 0454   PLT 221 12/25/2014 1914   Comprehensive Metabolic Panel:    Component Value Date/Time   NA 140 01/24/2020 0454   NA 139 12/25/2014 1914   K 3.1 (L) 01/24/2020 0454   K 3.8 12/25/2014 1914   CO2 29 01/24/2020 0454   CO2 25 12/25/2014 1914   BUN 26 (H) 01/24/2020 0454   BUN 20 12/25/2014 1914   CREATININE 1.04 (H) 01/24/2020 0454   CREATININE 1.07 (H) 12/25/2014 1914   ALBUMIN 4.2 10/13/2019 1338   ALBUMIN 4.6 12/25/2014 1914     Review of Systems  Unable to perform ROS: Dementia   Physical Exam General: NAD, chronically-ill appearing Cardiovascular: regular rate and rhythm Pulmonary: diminished bilaterally  Abdomen: soft, nontender, + bowel sounds Extremities: no edema, no joint deformities Neurological: opens eyes, denies pain, dementia, is verbal with no association to asked questions other than pain    Prognosis: < 6 months in the setting of frequent falls, hypoxemia, poor oral nutrition, risk for aspiration, advanced dementia, deconditioning, combined CHF, a-fib, hypertension, CKD, and hypothyroidism  Discharge Planning:  Per family patient to retun to [facility] with outpatient hospice support.   Recommendations:  DNR/DNI-as confirmed by [name] (POA)/completed form on chart   MOST form completed and placed on chart  Continue to treat the treatable, no escalation of care or aggressive  measures  Family hopeful patient can return to [facility] with outpatient hospice support. Would not want rehospitalizations.   PMT will continue to support and follow as needed.    Palliative Performance Scale: PPS 20-30%              [name] [facility]) and family expressed understanding and was in agreement with this plan.   Thank you for allowing the Palliative Medicine Team to assist in the care of this patient.  Time In: 1415 Time Out: 1535 Time Total: 80 min.   Visit consisted of counseling and education dealing with the complex and emotionally intense issues of symptom management and palliative care in the setting of serious and potentially life-threatening illness.Greater than 50%  of this time was spent counseling and coordinating care related to the above assessment and plan.  Signed by:  Alda Lea, AGPCNP-BC Palliative Medicine Team  Phone: [phone] Fax: [phone] Pager: [phone] Amion: Bjorn Pippin

## 2020-01-25 DIAGNOSIS — I482 Chronic atrial fibrillation, unspecified: Secondary | ICD-10-CM

## 2020-01-25 LAB — BASIC METABOLIC PANEL
Anion gap: 15 (ref 5–15)
BUN: 33 mg/dL — ABNORMAL HIGH (ref 8–23)
CO2: 29 mmol/L (ref 22–32)
Calcium: 9.4 mg/dL (ref 8.9–10.3)
Chloride: 98 mmol/L (ref 98–111)
Creatinine, Ser: 1.15 mg/dL — ABNORMAL HIGH (ref 0.44–1.00)
GFR calc Af Amer: 51 mL/min — ABNORMAL LOW (ref >60)
GFR calc non Af Amer: 44 mL/min — ABNORMAL LOW (ref >60)
Glucose, Bld: 110 mg/dL — ABNORMAL HIGH (ref 70–99)
Potassium: 4 mmol/L (ref 3.5–5.1)
Sodium: 142 mmol/L (ref 135–145)

## 2020-01-25 LAB — PROTIME-INR
INR: 2.5 — ABNORMAL HIGH (ref 0.8–1.2)
Prothrombin Time: 25.9 seconds — ABNORMAL HIGH (ref 11.4–15.2)

## 2020-01-25 MED ORDER — WARFARIN SODIUM 5 MG PO TABS
5.0000 mg | ORAL_TABLET | Freq: Once | ORAL | Status: DC
  Filled 2020-01-25: qty 1

## 2020-01-25 MED ORDER — POTASSIUM CHLORIDE CRYS ER 20 MEQ PO TBCR
40.0000 meq | EXTENDED_RELEASE_TABLET | Freq: Once | ORAL | Status: AC
  Administered 2020-01-25: 40 meq via ORAL
  Filled 2020-01-25: qty 2

## 2020-01-25 MED ORDER — TRAMADOL HCL 50 MG PO TABS: 50.0000 mg | ORAL_TABLET | Freq: Four times a day (QID) | ORAL | 0 refills | Status: AC | PRN

## 2020-01-25 NOTE — NC FL2 (Addendum)
[place] LEVEL OF CARE SCREENING TOOL     IDENTIFICATION  Patient Name: Sally Carroll Birthdate: [birth year] Sex: female Admission Date (Current Location): 01/22/2020  [place] and [place] Number:  Engineering geologist and Address:  [facility], [address], [place], [place] [address]      Provider Number: [id number]  Attending Physician Name and Address:  Wyvonnia Dusky, MD  Relative Name and Phone Number:       Current Level of Care: (ALF) Recommended Level of Care: [place] Prior Approval Number:    Date Approved/Denied:   PASRR Number:    Discharge Plan: ([facility]  ALF)    Current Diagnoses: Patient Active Problem List   Diagnosis Date Noted  . CHF (congestive heart failure) ([place]) 01/23/2020  . Acute on chronic systolic CHF (congestive heart failure) ([place]) 01/22/2020  . Acute respiratory failure with hypoxia ([place]) 01/22/2020  . Fall   . HCAP (healthcare-associated pneumonia) 10/13/2019  . Acute metabolic encephalopathy [id number]  . Hypothyroid 10/13/2019  . CKD (chronic kidney disease), stage IIIa 10/13/2019  . Dementia ([place]) 10/13/2019  . Intermediate stage dry age-related macular degeneration of both eyes 07/01/2017  . Pterygium eye, left 04/02/2017  . Late onset Alzheimer's disease with behavioral disturbance ([place]) 11/01/2016  . Rotator cuff tendinitis, left 07/29/2016  . Lumbar spondylosis 05/27/2016  . Pneumonia 11/09/2015  . Herniated lumbar intervertebral disc 02/10/2015  . 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 Lumbar radiculitis 11/11/2014  . Primary osteoarthritis of right hip 10/24/2014  . Absolute anemia 01/14/2014  . Long term (current) use of anticoagulants 12/31/2013  . NSVT (nonsustained ventricular tachycardia) ([place]) 09/04/2011  . Anxiety and depression 08/29/2011  . A-fib ([place]) 08/29/2011  . Breast CA ([place]) 08/29/2011   . Artificial cardiac pacemaker 08/29/2011  . Acquired complete AV block ([place]) 08/29/2011  . Hyperlipidemia, unspecified 08/29/2011  . Hypertension 08/29/2011  . Restless leg syndrome 08/29/2011    Orientation RESPIRATION BLADDER Height & Weight     Self, Place, Situation    Incontinent Weight: 56.2 kg Height:  5\' 3"  (160 cm)  BEHAVIORAL SYMPTOMS/MOOD NEUROLOGICAL BOWEL NUTRITION STATUS      Continent    AMBULATORY STATUS COMMUNICATION OF NEEDS Skin   Supervision                           Personal Care Assistance Level of Assistance  Bathing, Dressing, Feeding Bathing Assistance: Limited assistance Feeding assistance: Independent Dressing Assistance: Limited assistance     Functional Limitations Info             SPECIAL CARE FACTORS FREQUENCY                       Contractures Contractures Info: Not present    Additional Factors Info  Code Status, Allergies Code Status Info: DNR Allergies Info: naproxen, sucralfate, prednison           Current Medications (01/25/2020):  This is the current hospital active medication list Current Facility-Administered Medications  Medication Dose Route Frequency Provider Last Rate Last Admin  . 0.9 %  sodium chloride infusion  250 mL Intravenous PRN Ivor Costa, MD      . acetaminophen (TYLENOL) tablet 650 mg  650 mg Oral Q6H PRN Ivor Costa, MD      . albuterol (PROVENTIL) (2.5 MG/3ML) 0.083% nebulizer solution 2.5 mg  2.5 mg  Inhalation Q4H PRN Ivor Costa, MD      . calcium carbonate (TUMS - dosed in mg elemental calcium) chewable tablet 600 mg of elemental calcium  600 mg of elemental calcium Oral Q breakfast Ivor Costa, MD   600 mg of elemental calcium at 01/25/20 0918  . carvedilol (COREG) tablet 6.25 mg  6.25 mg Oral BID WC Ivor Costa, MD   6.25 mg at 01/25/20 0917  . citalopram (CELEXA) tablet 20 mg  20 mg Oral Daily Ivor Costa, MD   20 mg at 01/25/20 0917  . dextromethorphan-guaiFENesin (MUCINEX DM) 30-600 MG per  12 hr tablet 1 tablet  1 tablet Oral BID PRN Ivor Costa, MD      . docusate sodium (COLACE) capsule 100 mg  100 mg Oral Daily PRN Ivor Costa, MD      . donepezil (ARICEPT) tablet 10 mg  10 mg Oral QHS Ivor Costa, MD   10 mg at 01/24/20 2039  . DULoxetine (CYMBALTA) DR capsule 30 mg  30 mg Oral Daily Ivor Costa, MD   30 mg at 01/25/20 0917  . feeding supplement (ENSURE ENLIVE) (ENSURE ENLIVE) liquid 237 mL  237 mL Oral BID BM Ivor Costa, MD   237 mL at 01/25/20 0918  . furosemide (LASIX) injection 40 mg  40 mg Intravenous Daily Wyvonnia Dusky, MD   40 mg at 01/25/20 0917  . gabapentin (NEURONTIN) capsule 200 mg  200 mg Oral TID Ivor Costa, MD   200 mg at 01/25/20 [id number]  . hydrALAZINE (APRESOLINE) injection 5 mg  5 mg Intravenous Q2H PRN Ivor Costa, MD   5 mg at 01/23/20 0640  . lamoTRIgine (LAMICTAL) tablet 50 mg  50 mg Oral Daily Ivor Costa, MD   50 mg at 01/25/20 0917  . levothyroxine (SYNTHROID) tablet 25 mcg  25 mcg Oral QAC breakfast Ivor Costa, MD   25 mcg at 01/25/20 0544  . LORazepam (ATIVAN) tablet 0.5 mg  0.5 mg Oral QHS PRN Ivor Costa, MD      . losartan (COZAAR) tablet 50 mg  50 mg Oral Daily Ivor Costa, MD   50 mg at 01/25/20 0917  . melatonin tablet 2.5 mg  2.5 mg Oral QHS Ivor Costa, MD   2.5 mg at 01/24/20 2039  . morphine 2 MG/ML injection 1 mg  1 mg Intravenous Q4H PRN Wyvonnia Dusky, MD      . ondansetron [facility]) injection 4 mg  4 mg Intravenous Q8H PRN Ivor Costa, MD      . QUEtiapine (SEROQUEL) tablet 12.5 mg  12.5 mg Oral BID Ivor Costa, MD   12.5 mg at 01/25/20 0917  . sodium chloride flush (NS) 0.9 % injection 3 mL  3 mL Intravenous Q12H Ivor Costa, MD   3 mL at 01/25/20 0918  . sodium chloride flush (NS) 0.9 % injection 3 mL  3 mL Intravenous PRN Ivor Costa, MD      . traMADol [facility]at[facility]bon) tablet 50 mg  50 mg Oral Q12H PRN Wyvonnia Dusky, MD   50 mg at 01/25/20 [id number]  . vitamin B-12 (CYANOCOBALAMIN) tablet 1,000 mcg  1,000 mcg Oral Daily Ivor Costa, MD   1,000 mcg  at 01/25/20 0917  . warfarin (COUMADIN) tablet 5 mg  5 mg Oral ONCE-1600 Oswald Hillock, RPH      . Warfarin - Pharmacist Dosing Inpatient   Does not apply [id number] Wyvonnia Dusky, MD   Given at 01/23/20 1423     Discharge  Medications: STOP taking these medications   nitrofurantoin (macrocrystal-monohydrate) 100 MG capsule Commonly known as: MACROBID     TAKE these medications   acetaminophen 325 MG tablet Commonly known as: TYLENOL Take 650 mg by mouth 2 (two) times daily as needed for mild pain or moderate pain.   acetaminophen 500 MG tablet Commonly known as: TYLENOL Take 1,000 mg by mouth every 8 (eight) hours as needed.   calcium carbonate 1500 (600 Ca) MG Tabs tablet Commonly known as: OSCAL Take by mouth.   carvedilol 6.25 MG tablet Commonly known as: COREG Take 1 tablet (6.25 mg total) by mouth 2 (two) times daily with a meal.   citalopram 20 MG tablet Commonly known as: CELEXA Take 20 mg by mouth daily.   docusate sodium 100 MG capsule Commonly known as: COLACE Take 100 mg by mouth daily.   donepezil 10 MG tablet Commonly known as: ARICEPT Take 10 mg by mouth at bedtime.   DULoxetine 30 MG capsule Commonly known as: CYMBALTA Take 30 mg by mouth daily.   feeding supplement (ENSURE ENLIVE) Liqd Take 237 mLs by mouth 2 (two) times daily between meals.   gabapentin 100 MG capsule Commonly known as: NEURONTIN Take 200 mg by mouth 3 (three) times daily.   guaiFENesin 600 MG 12 hr tablet Commonly known as: MUCINEX Take 600 mg by mouth 2 (two) times daily as needed for to loosen phlegm.   lamoTRIgine 25 MG tablet Commonly known as: LAMICTAL Take 50 mg by mouth daily.   levothyroxine 25 MCG tablet Commonly known as: SYNTHROID Take 25 mcg by mouth daily before breakfast.   LORazepam 0.5 MG tablet Commonly known as: ATIVAN Take 0.5 mg by mouth at bedtime as needed (agitation).   losartan 50 MG tablet Commonly known as: COZAAR Take  50 mg by mouth daily.   melatonin 3 MG Tabs tablet Take 3 mg by mouth at bedtime.   QUEtiapine 25 MG tablet Commonly known as: SEROQUEL Take 12.5 mg by mouth 2 (two) times daily.   traMADol 50 MG tablet Commonly known as: ULTRAM Take 1 tablet (50 mg total) by mouth every 6 (six) hours as needed for up to 3 days for moderate pain or severe pain.   vitamin B-12 1000 MCG tablet Commonly known as: CYANOCOBALAMIN Take 1,000 mcg by mouth daily.   warfarin 5 MG tablet Commonly known as: COUMADIN Take 5 mg by mouth every Monday, Tuesday, Wednesday, Thursday, and Friday.   warfarin 7.5 MG tablet Commonly known as: COUMADIN Take 7.5 mg by mouth See admin instructions. Saturday and Sunday     Relevant Imaging Results:  Relevant Lab Results:   Additional Information [id number]  Victorino Dike, RN

## 2020-01-25 NOTE — Discharge Summary (Signed)
Physician Discharge Summary  Sally Carroll:[id number] DOB: 19-May-1934 DOA: 01/22/2020  PCP: Tracie Harrier, MD  Admit date: 01/22/2020 Discharge date: 01/25/2020  Admitted From: home Disposition: HomePlace w/ hospice  Recommendations for Outpatient Follow-up:  1. Follow up with  Hospice physician/ NP ASAP  Home Health: no Equipment/Devices:  Discharge Condition: hospice CODE STATUS: DNR Diet recommendation: as tolerated  Brief/Interim Summary: HPI was taken from Dr. Blaine Hamper: Sally Carroll is a 86 y.o. female with medical history significant of hypertension, hyperlipidemia, GERD, depression, pacemaker placement due to complete heart block, breast cancer (s/p of radiation therapy), anemia, atrial fibrillation on Coumadin, CKD-3, dementia, sCHF with EF 30%, who presents with fall.  Per her niece, patient has dementia, cannot answer any question which is normal to patient.  Per report, patient had an unwitnessed fall in the nursing home and found on the floor this AM.  No bruise on her body.  Patient has chronic left shoulder pain.  Does not seem to have pain in other areas.  No active respiratory distress, cough, nausea, vomiting, diarrhea noted.  She moves all extremities.  No facial droop. Patient was found to have oxygen desaturation to 77% on room air in ED.  ED Course: pt was found to have WBC 7.4, BNP >4500,  pending COVID-19 PCR, CK 270, troponin 52, renal function stable, temperature normal, blood pressure 169/109, heart rate 84, tachypnea.  Chest x-ray showed vascular congestion, interstitial pulmonary edema.  CT of head is negative for acute intracranial abnormalities.  Patient is placed on progressive bed for observation.  Hospital Course from Dr. Lenise Herald 5/23-5/25/21: Pt was found to have acute on chronic hypoxic respiratory failure secondary to CHF exacerbation. Pt was diuresed w/ IV lasix. Pt did have a neg fluid balance. Pt did require supplemental oxygen but since  has been weaned from oxygen. Of note, pt has severe dementia w/ failure to thrive as pt is refusing to eat or drink, pt is unable to ambulate or complete any ADLs. After a discussion with the pt's family they agreed to make the pt comfortable and not pursue any aggressive care. Palliative care and hospice both met with the family and the pt's family agreed to proceed w/ hospice care at [facility]. For more information, please see previous progress notes.    Discharge Diagnoses:  Principal Problem:   Acute on chronic systolic CHF (congestive heart failure) (HCC) Active Problems:   Anxiety and depression   A-fib (HCC)   Hypertension   Hypothyroid   CKD (chronic kidney disease), stage IIIa   Dementia (HCC)   Acute respiratory failure with hypoxia ([place])   Fall   CHF (congestive heart failure) (HCC)  Failure to thrive: severe dementia, refusing to eat or drink, acute on chronic combined CHF, & respiratory failure. Pt is unable to complete any ADLs. Unable to ambulate.   Acute hypoxic respiratory failure: secondary to acute on chronic systolic CHF exacerbation. Supplemental oxygen was able to weaned off today  Acute on chronic systolic CHF exacerbation: echo on 09/16/2018 showed EF of 30%. Continue on IV lasix while inpatient. Strict I/Os & daily weights. Neg fluid balance. Echo 01/2020 shows EF 20-25%, global hypokinesis, grade III diastolic dysfunction, severe aortic stenosis. Obtain REDs Vest reading is 38.   Hypokalemia: WNL today. Will continue to monitor  Dementia: continue donepezil. Not oriented to person, place or time   Depression and anxiety: no suicidal or homicidal ideations. Severity unknown. Continue on home dose of cymbalta, celexa, lamictal, ativan, seroquel  Chronic a. fib: continue warfarin and coreg. INR is therapeutic   HTN: continue on home dose of coreg and cozaar. Hydralazine prn  Hypothyroidism: continue on home dose of synthroid  CKDIIIa: stable. Will continue  to monitor   Fall: CT head negative. Chronic left shoulder pain. XR left shoulder shows neg fracture or dislocation. Morphine, tramadol prn     Discharge Instructions  Discharge Instructions    Diet - low sodium heart healthy   Complete by: As directed    Discharge instructions   Complete by: As directed    F/u w/ hospice physician/NP ASAP   Increase activity slowly   Complete by: As directed      Allergies as of 01/25/2020      Reactions   Naproxen    Other reaction(s): Other (See Comments) GI upset   Sucralfate    Other reaction(s): Other (See Comments) Abdominal bloating   Prednisone Anxiety   Shakes      Medication List    STOP taking these medications   nitrofurantoin (macrocrystal-monohydrate) 100 MG capsule Commonly known as: MACROBID     TAKE these medications   acetaminophen 325 MG tablet Commonly known as: TYLENOL Take 650 mg by mouth 2 (two) times daily as needed for mild pain or moderate pain.   acetaminophen 500 MG tablet Commonly known as: TYLENOL Take 1,000 mg by mouth every 8 (eight) hours as needed.   calcium carbonate 1500 (600 Ca) MG Tabs tablet Commonly known as: OSCAL Take by mouth.   carvedilol 6.25 MG tablet Commonly known as: COREG Take 1 tablet (6.25 mg total) by mouth 2 (two) times daily with a meal.   citalopram 20 MG tablet Commonly known as: CELEXA Take 20 mg by mouth daily.   docusate sodium 100 MG capsule Commonly known as: COLACE Take 100 mg by mouth daily.   donepezil 10 MG tablet Commonly known as: ARICEPT Take 10 mg by mouth at bedtime.   DULoxetine 30 MG capsule Commonly known as: CYMBALTA Take 30 mg by mouth daily.   feeding supplement (ENSURE ENLIVE) Liqd Take 237 mLs by mouth 2 (two) times daily between meals.   gabapentin 100 MG capsule Commonly known as: NEURONTIN Take 200 mg by mouth 3 (three) times daily.   guaiFENesin 600 MG 12 hr tablet Commonly known as: MUCINEX Take 600 mg by mouth 2 (two)  times daily as needed for to loosen phlegm.   lamoTRIgine 25 MG tablet Commonly known as: LAMICTAL Take 50 mg by mouth daily.   levothyroxine 25 MCG tablet Commonly known as: SYNTHROID Take 25 mcg by mouth daily before breakfast.   LORazepam 0.5 MG tablet Commonly known as: ATIVAN Take 0.5 mg by mouth at bedtime as needed (agitation).   losartan 50 MG tablet Commonly known as: COZAAR Take 50 mg by mouth daily.   melatonin 3 MG Tabs tablet Take 3 mg by mouth at bedtime.   QUEtiapine 25 MG tablet Commonly known as: SEROQUEL Take 12.5 mg by mouth 2 (two) times daily.   traMADol 50 MG tablet Commonly known as: ULTRAM Take 1 tablet (50 mg total) by mouth every 6 (six) hours as needed for up to 3 days for moderate pain or severe pain.   vitamin B-12 1000 MCG tablet Commonly known as: CYANOCOBALAMIN Take 1,000 mcg by mouth daily.   warfarin 5 MG tablet Commonly known as: COUMADIN Take 5 mg by mouth every Monday, Tuesday, Wednesday, Thursday, and Friday.   warfarin 7.5 MG tablet Commonly known  as: COUMADIN Take 7.5 mg by mouth See admin instructions. Saturday and 'Sunday      Follow-up Information    Schedule an appointment as soon as possible for a visit  with Hande, Vishwanath, MD.   Specialty: Internal Medicine Contact information: [address] [facility] [place] [address] [phone]        Schedule an appointment as soon as possible for a visit  with Agbor-Etang, Brian, MD.   Specialties: Cardiology, Radiology Contact information: [address] [place] [address] [phone]          Allergies  Allergen Reactions  . Naproxen     Other reaction(s): Other (See Comments) GI upset  . Sucralfate     Other reaction(s): Other (See Comments) Abdominal bloating  . Prednisone Anxiety    Shakes    Consultations:  Hospice/palliative care   Procedures/Studies: DG Shoulder 1V Left  Result Date: 01/22/2020 CLINICAL  DATA:  Recent fall with shoulder pain, initial encounter EXAM: LEFT SHOULDER COMPARISON:  None. FINDINGS: There is no evidence of fracture or dislocation. There is no evidence of arthropathy or other focal bone abnormality. Soft tissues are unremarkable. IMPRESSION: No acute abnormality noted on this single view of the left shoulder. Electronically Signed   By: Mark  Lukens M.D.   On: 01/22/2020 13:46   CT Head Wo Contrast  Result Date: 01/22/2020 CLINICAL DATA:  Recent fall, initial encounter EXAM: CT HEAD WITHOUT CONTRAST TECHNIQUE: Contiguous axial images were obtained from the base of the skull through the vertex without intravenous contrast. COMPARISON:  10/13/2019 FINDINGS: Brain: No evidence of acute infarction, hemorrhage, hydrocephalus, extra-axial collection or mass lesion/mass effect. Chronic atrophic and ischemic changes are again seen. Vascular: No hyperdense vessel or unexpected calcification. Skull: Normal. Negative for fracture or focal lesion. Sinuses/Orbits: No acute finding. Other: None. IMPRESSION: Chronic atrophic and ischemic changes without acute abnormality. Electronically Signed   By: Mark  Lukens M.D.   On: 01/22/2020 08:14   DG Chest Portable 1 View  Result Date: 01/22/2020 CLINICAL DATA:  Recent fall EXAM: PORTABLE CHEST 1 VIEW COMPARISON:  10/13/2019 FINDINGS: Cardiac shadow is enlarged but stable. Pacing device is again seen. Aortic calcifications are noted. Diffuse vascular congestion is again identified and stable. Mild parenchymal edema is noted. No focal infiltrate or sizable effusion is seen. No bony abnormality is noted. IMPRESSION: Vascular congestion and mild interstitial edema consistent with early CHF. Electronically Signed   By: Mark  Lukens M.D.   On: 01/22/2020 10:48   ECHOCARDIOGRAM COMPLETE  Result Date: 01/23/2020    ECHOCARDIOGRAM REPORT   Patient Name:   Rise C Shatto Date of Exam: 01/23/2020 Medical Rec #:  [id number]        Height:       63.0 in  Accession #:    [id number]       Weight:       123.8 lb Date of Birth:  07/08/1934        BSA:          1.577 m Patient Age:    85 years         BP:           168/98 mmHg Patient Gender: F                HR:           80'  bpm. Exam Location:  [facility] Procedure: 2D Echo, Cardiac Doppler and Color Doppler Indications:     CHF- acute  diastolic 010.27  History:         Patient has no prior history of Echocardiogram examinations.                  Pacemaker, Signs/Symptoms:Murmur; Risk Factors:Hypertension.  Sonographer:     Sherrie Sport RDCS (AE) Referring Phys:  [id number] Gerlach NIU Diagnosing Phys: Kate Sable MD IMPRESSIONS  1. Left ventricular ejection fraction, by estimation, is 20 to 25%. The left ventricle has severely decreased function. The left ventricle demonstrates global hypokinesis. The left ventricular internal cavity size was mildly dilated. Left ventricular diastolic parameters are consistent with Grade III diastolic dysfunction (restrictive).  2. Right ventricular systolic function is moderately reduced. The right ventricular size is mildly enlarged. There is severely elevated pulmonary artery systolic pressure.  3. Left atrial size was severely dilated.  4. Right atrial size was severely dilated.  5. The mitral valve is normal in structure. Mild to moderate mitral valve regurgitation.  6. Low flow , low gradient severe Aortic stenosis. DVI = 0.12     . Unable to determine valve morphology due to image quality. Aortic valve regurgitation is trivial. Severe aortic valve stenosis. Aortic valve area, by VTI measures 0.39 cm. Aortic valve mean gradient measures 23.7 mmHg. Aortic valve Vmax measures 3.19 m/s.  7. The inferior vena cava is normal in size with greater than 50% respiratory variability, suggesting right atrial pressure of 3 mmHg. FINDINGS  Left Ventricle: Left ventricular ejection fraction, by estimation, is 20 to 25%. The left ventricle has severely decreased function. The left ventricle  demonstrates global hypokinesis. The left ventricular internal cavity size was mildly dilated. There is no left ventricular hypertrophy. Left ventricular diastolic parameters are consistent with Grade III diastolic dysfunction (restrictive). Right Ventricle: The right ventricular size is mildly enlarged. No increase in right ventricular wall thickness. Right ventricular systolic function is moderately reduced. There is severely elevated pulmonary artery systolic pressure. The tricuspid regurgitant velocity is 4.07 m/s, and with an assumed right atrial pressure of 3 mmHg, the estimated right ventricular systolic pressure is 64.4 mmHg. Left Atrium: Left atrial size was severely dilated. Right Atrium: Right atrial size was severely dilated. Pericardium: There is no evidence of pericardial effusion. Mitral Valve: The mitral valve is normal in structure. Mild to moderate mitral valve regurgitation. Tricuspid Valve: The tricuspid valve is normal in structure. Tricuspid valve regurgitation is mild. Aortic Valve: Low flow , low gradient severe Aortic stenosis. DVI = 0.12. Unable to determine valve morphology due to image quality. Aortic valve regurgitation is trivial. Aortic regurgitation PHT measures 538 msec. Severe aortic stenosis is present. Aortic valve mean gradient measures 23.7 mmHg. Aortic valve peak gradient measures 40.7 mmHg. Aortic valve area, by VTI measures 0.39 cm. Pulmonic Valve: The pulmonic valve was not well visualized. Pulmonic valve regurgitation is not visualized. Aorta: The aortic root is normal in size and structure. Venous: The inferior vena cava is normal in size with greater than 50% respiratory variability, suggesting right atrial pressure of 3 mmHg. IAS/Shunts: No atrial level shunt detected by color flow Doppler. Additional Comments: A pacer wire is visualized.  LEFT VENTRICLE PLAX 2D LVIDd:         5.61 cm LVIDs:         4.87 cm LV PW:         1.70 cm LV IVS:        1.08 cm LVOT diam:      2.00 cm LV SV:  24 LV SV Index:   15 LVOT Area:     3.14 cm  LV Volumes (MOD) LV vol d, MOD A2C: 128.0 ml LV vol d, MOD A4C: 116.0 ml LV vol s, MOD A2C: 99.9 ml LV vol s, MOD A4C: 80.0 ml LV SV MOD A2C:     28.1 ml LV SV MOD A4C:     116.0 ml LV SV MOD BP:      29.5 ml RIGHT VENTRICLE RV Basal diam:  4.12 cm RV S prime:     11.10 cm/s TAPSE (M-mode): 3.6 cm LEFT ATRIUM              Index       RIGHT ATRIUM           Index LA diam:        5.80 cm  3.68 cm/m  RA Area:     22.80 cm LA Vol (A2C):   119.0 ml 75.46 ml/m RA Volume:   71.80 ml  45.53 ml/m LA Vol (A4C):   155.0 ml 98.29 ml/m LA Biplane Vol: 136.0 ml 86.24 ml/m  AORTIC VALVE                    PULMONIC VALVE AV Area (Vmax):    0.46 cm     PV Vmax:        0.92 m/s AV Area (Vmean):   0.41 cm     PV Peak grad:   3.4 mmHg AV Area (VTI):     0.39 cm     RVOT Peak grad: 7 mmHg AV Vmax:           319.00 cm/s AV Vmean:          229.333 cm/s AV VTI:            0.615 m AV Peak Grad:      40.7 mmHg AV Mean Grad:      23.7 mmHg LVOT Vmax:         46.30 cm/s LVOT Vmean:        29.800 cm/s LVOT VTI:          0.077 m LVOT/AV VTI ratio: 0.12 AI PHT:            538 msec  AORTA Ao Root diam: 2.90 cm MITRAL VALVE                TRICUSPID VALVE MV Area (PHT): 5.50 cm     TR Peak grad:   66.3 mmHg MV Decel Time: 138 msec     TR Vmax:        407.00 cm/s MV E velocity: 103.00 cm/s MV A velocity: 35.60 cm/s   SHUNTS MV E/A ratio:  2.89         Systemic VTI:  0.08 m                             Systemic Diam: 2.00 cm Kate Sable MD Electronically signed by Kate Sable MD Signature Date/Time: 01/23/2020/12:01:20 PM    Final       Subjective: Pt is not answer any of my questions again today.    Discharge Exam: Vitals:   01/25/20 1139 01/25/20 1139  BP: 105/83 105/83  Pulse: 77 78  Resp: 15 15  Temp: 99.4 F (37.4 C) 99.4 F (37.4 C)  SpO2:  97%   Vitals:   01/25/20 0458 01/25/20 0734 01/25/20 1139 01/25/20  1139  BP: (!) 146/81 (!) 155/85  105/83 105/83  Pulse: 77 75 77 78  Resp: '20 15 15 15  ' Temp: 99.6 F (37.6 C) 98.9 F (37.2 C) 99.4 F (37.4 C) 99.4 F (37.4 C)  TempSrc: Oral Axillary Oral Oral  SpO2: 95% 96%  97%  Weight:      Height:        General: Pt is alert, awake, not in acute distress Cardiovascular: S1/S2 +, no rubs, no gallops Respiratory: diminished breath sounds b/l Abdominal: Soft, NT, ND, bowel sounds + Extremities: no cyanosis    The results of significant diagnostics from this hospitalization (including imaging, microbiology, ancillary and laboratory) are listed below for reference.     Microbiology: Recent Results (from the past 240 hour(s))  SARS Coronavirus 2 by RT PCR (hospital order, performed in [facility] hospital lab) Nasopharyngeal Nasopharyngeal Swab     Status: None   Collection Time: 01/22/20 12:09 PM   Specimen: Nasopharyngeal Swab  Result Value Ref Range Status   SARS Coronavirus 2 NEGATIVE NEGATIVE Final    Comment: (NOTE) SARS-CoV-2 target nucleic acids are NOT DETECTED. The SARS-CoV-2 RNA is generally detectable in upper and lower respiratory specimens during the acute phase of infection. The lowest concentration of SARS-CoV-2 viral copies this assay can detect is 250 copies / mL. A negative result does not preclude SARS-CoV-2 infection and should not be used as the sole basis for treatment or other patient management decisions.  A negative result may occur with improper specimen collection / handling, submission of specimen other than nasopharyngeal swab, presence of viral mutation(s) within the areas targeted by this assay, and inadequate number of viral copies (<250 copies / mL). A negative result must be combined with clinical observations, patient history, and epidemiological information. Fact Sheet for Patients:   StrictlyIdeas.no Fact Sheet for Healthcare Providers: BankingDealers.co.za This test is not yet  approved or cleared  by the [place] FDA and has been authorized for detection and/or diagnosis of SARS-CoV-2 by FDA under an Emergency Use Authorization (EUA).  This EUA will remain in effect (meaning this test can be used) for the duration of the COVID-19 declaration under Section 564(b)(1) of the Act, 21 U.S.C. section 360bbb-3(b)(1), unless the authorization is terminated or revoked sooner. Performed at [facility], [address] [place], [place] [address]   MRSA PCR Screening     Status: None   Collection Time: 01/22/20  2:48 PM   Specimen: Nasal Mucosa; Nasopharyngeal  Result Value Ref Range Status   MRSA by PCR NEGATIVE NEGATIVE Final    Comment:        The GeneXpert MRSA Assay (FDA approved for NASAL specimens only), is one component of a comprehensive MRSA colonization surveillance program. It is not intended to diagnose MRSA infection nor to guide or monitor treatment for MRSA infections. Performed at [facility], [address] [place], [place] [address]      Labs: BNP (last 3 results) Recent Labs    01/22/20 0729  BNP >8,127.5*   Basic Metabolic Panel: Recent Labs  Lab 01/22/20 0728 01/23/20 0110 01/24/20 0454 01/25/20 0410  NA 138 139 140 142  K 4.9 3.7 3.1* 4.0  CL 101 100 97* 98  CO2 '22 24 29 29  ' GLUCOSE 155* 124* 99 110*  BUN 21 26* 26* 33*  CREATININE 1.10* 1.29* 1.04* 1.15*  CALCIUM 9.2 9.0 9.1 9.4  MG  --  1.7  --   --  Liver Function Tests: No results for input(s): AST, ALT, ALKPHOS, BILITOT, PROT, ALBUMIN in the last 168 hours. No results for input(s): LIPASE, AMYLASE in the last 168 hours. No results for input(s): AMMONIA in the last 168 hours. CBC: Recent Labs  Lab 01/22/20 0728 01/23/20 0110 01/24/20 0454  WBC 7.4 9.7 9.3  HGB 11.8* 11.4* 12.8  HCT 35.8* 34.0* 38.8  MCV 96.8 95.5 96.5  PLT 224 219 217   Cardiac Enzymes: Recent Labs  Lab 01/22/20 0728  CKTOTAL 270*   BNP: Invalid  input(s): POCBNP CBG: No results for input(s): GLUCAP in the last 168 hours. D-Dimer No results for input(s): DDIMER in the last 72 hours. Hgb A1c Recent Labs    01/23/20 0110  HGBA1C 5.5   Lipid Profile Recent Labs    01/23/20 0110  CHOL 157  HDL 31*  LDLCALC 107*  TRIG 95  CHOLHDL 5.1   Thyroid function studies No results for input(s): TSH, T4TOTAL, T3FREE, THYROIDAB in the last 72 hours.  Invalid input(s): FREET3 Anemia work up No results for input(s): VITAMINB12, FOLATE, FERRITIN, TIBC, IRON, RETICCTPCT in the last 72 hours. Urinalysis    Component Value Date/Time   COLORURINE YELLOW (A) 01/22/2020 0824   APPEARANCEUR HAZY (A) 01/22/2020 0824   APPEARANCEUR Clear 07/07/2018 1027   LABSPEC 1.022 01/22/2020 0824   LABSPEC 1.024 01/04/2014 1517   PHURINE 5.0 01/22/2020 0824   GLUCOSEU NEGATIVE 01/22/2020 0824   GLUCOSEU Negative 01/04/2014 1517   HGBUR LARGE (A) 01/22/2020 0824   BILIRUBINUR NEGATIVE 01/22/2020 0824   BILIRUBINUR Negative 07/07/2018 1027   BILIRUBINUR Negative 01/04/2014 1517   KETONESUR 20 (A) 01/22/2020 0824   PROTEINUR >=300 (A) 01/22/2020 0824   NITRITE NEGATIVE 01/22/2020 0824   LEUKOCYTESUR NEGATIVE 01/22/2020 0824   LEUKOCYTESUR Negative 01/04/2014 1517   Sepsis Labs Invalid input(s): PROCALCITONIN,  WBC,  LACTICIDVEN Microbiology Recent Results (from the past 240 hour(s))  SARS Coronavirus 2 by RT PCR (hospital order, performed in [place] hospital lab) Nasopharyngeal Nasopharyngeal Swab     Status: None   Collection Time: 01/22/20 12:09 PM   Specimen: Nasopharyngeal Swab  Result Value Ref Range Status   SARS Coronavirus 2 NEGATIVE NEGATIVE Final    Comment: (NOTE) SARS-CoV-2 target nucleic acids are NOT DETECTED. The SARS-CoV-2 RNA is generally detectable in upper and lower respiratory specimens during the acute phase of infection. The lowest concentration of SARS-CoV-2 viral copies this assay can detect is 250 copies / mL. A  negative result does not preclude SARS-CoV-2 infection and should not be used as the sole basis for treatment or other patient management decisions.  A negative result may occur with improper specimen collection / handling, submission of specimen other than nasopharyngeal swab, presence of viral mutation(s) within the areas targeted by this assay, and inadequate number of viral copies (<250 copies / mL). A negative result must be combined with clinical observations, patient history, and epidemiological information. Fact Sheet for Patients:   StrictlyIdeas.no Fact Sheet for Healthcare Providers: BankingDealers.co.za This test is not yet approved or cleared  by the [place] FDA and has been authorized for detection and/or diagnosis of SARS-CoV-2 by FDA under an Emergency Use Authorization (EUA).  This EUA will remain in effect (meaning this test can be used) for the duration of the COVID-19 declaration under Section 564(b)(1) of the Act, 21 U.S.C. section 360bbb-3(b)(1), unless the authorization is terminated or revoked sooner. Performed at [facility], [address] [place], [place] [address]  MRSA PCR Screening     Status: None   Collection Time: 01/22/20  2:48 PM   Specimen: Nasal Mucosa; Nasopharyngeal  Result Value Ref Range Status   MRSA by PCR NEGATIVE NEGATIVE Final    Comment:        The GeneXpert MRSA Assay (FDA approved for NASAL specimens only), is one component of a comprehensive MRSA colonization surveillance program. It is not intended to diagnose MRSA infection nor to guide or monitor treatment for MRSA infections. Performed at [facility], [address] [place], [place] [address]      Time coordinating discharge: Over 30 minutes  SIGNED:   Wyvonnia Dusky, MD  [facility] 01/25/2020, 11:55 AM Pager   If 7PM-7AM, please contact  night-coverage [email or web address]

## 2020-01-25 NOTE — Progress Notes (Addendum)
  Speech Language Pathology Treatment: Dysphagia  Patient Details Name: Sally Carroll MRN: [id number] DOB: [birth year] Today's Date: 01/25/2020 Time: [id number] SLP Time Calculation (min) (ACUTE ONLY): 35 min  Assessment / Plan / Recommendation Clinical Impression  Pt seen today for ongoing assessment of swallowing and toleration of current dysphagia diet as ordered; level 1 (puree) w/ Nectar consistency liquids. Pt has been taking bites and sips w/ full NSG assistance for feeding; total care for ADLs. No reported overt s/s of aspiration w/ this diet consistency.  Pt continues to present w/ declined Cognitive awareness during po tasks and requires Mod+ verbal/tactile/visual stim and cues to take po's - tactile stim of spoon at lips to engage opening mouth. Pt accepted po trials then transferred posteriorly for swallowing given min extra time intermittently. Suspect impact from Cognitive decline on the Oral phase of swallowing - this can increase risk for pharyngeal phase deficits and aspiration. Total feeding support required. Pt was nonverbal often looking at SLP given stim. She mumbled a few words when she seemed anxious about having to pee then calmed afterwards. She did not follow instructions.  Recommend continue current dysphagia diet w/ purees, Nectar consistency liquids. Strict aspiration precautions - liquids via tsp, cup. Feeding support monitoring pt's cues and oral clearing. ST services will f/u next 2-3 days - suspect this diet consistency would best benefit pt at discharge for safety w/ oral intake. NSG updated. Noted Palliative/Hospice following at discharge.     HPI HPI: Pt is a 86 y.o. female with a past medical history of Multiple medical issues including anxiety, anemia, depression, hypertension, Dementia, CKD, presents to the emergency department after a fall.  According to staff patient was found on the floor at her nursing facility.  This was an unwitnessed fall.  Patient has a  history of dementia and cannot contribute to her history or review of systems.  Pt awakens intermittently to verbal/tactile cues; mumbled few words but cannot follow commands. Patient came in from [place] unit at [facility] and has been weaker w/ recent Fall per chart notes.  CXR revealed: "Vascular congestion and mild interstitial edema consistent with early CHF".  Pt is dependent for most ADLs at living facility per report.       SLP Plan  Continue with current plan of care       Recommendations  Diet recommendations: Dysphagia 1 (puree);Nectar-thick liquid Liquids provided via: Teaspoon;Cup Medication Administration: Crushed with puree(as able to crush) Supervision: Staff to assist with self feeding;Full supervision/cueing for compensatory strategies Compensations: Minimize environmental distractions;Slow rate;Small sips/bites;Lingual sweep for clearance of pocketing;Multiple dry swallows after each bite/sip;Follow solids with liquid Postural Changes and/or Swallow Maneuvers: Seated upright 90 degrees;Upright 30-60 min after meal                General recommendations: (dietician; palliative care) Oral Care Recommendations: Oral care BID;Oral care before and after PO;Staff/trained caregiver to provide oral care Follow up Recommendations: Skilled Nursing facility SLP Visit Diagnosis: Dysphagia, oral phase (R13.11);Dysphagia, oropharyngeal phase (R13.12)(baseline Dementia, cognitive decline) Plan: Continue with current plan of care       Andrew, [place], CCC-SLP Ruffus Kamaka 01/25/2020, 10:31 AM

## 2020-01-25 NOTE — TOC Progression Note (Signed)
Transition of Care [facility]) - Progression Note    Patient Details  Name: Sally Carroll MRN: [id number] Date of Birth: [birth year]  Transition of Care [facility]) CM/SW [place], RN Phone Number: 01/25/2020, 9:11 AM  Clinical Narrative:    Currently waiting on DME to be delivered to facility today before Discharge.   Expected Discharge Plan: Skilled Nursing Facility Barriers to Discharge: Equipment Delay  Expected Discharge Plan and Services Expected Discharge Plan: [place] In-house Referral: Clinical Social Work   Post Acute Care Choice: [place] Living arrangements for the past 2 months: Assisted Living Facility([facility] - Memory Care Unit.)                             [facility] Agency: [facility] Date [place]: 01/25/20 Time [place]: [phone] Representative spoke with at [place]: Earnestine Mealing, RN   Social Determinants of Health ([place]) Interventions    Readmission Risk Interventions No flowsheet data found.

## 2020-01-25 NOTE — Plan of Care (Signed)
  Problem: Clinical Measurements: Goal: Will remain free from infection Outcome: Progressing Goal: Diagnostic test results will improve Outcome: Progressing   Problem: Activity: Goal: Risk for activity intolerance will decrease Outcome: Progressing   

## 2020-01-25 NOTE — Consult Note (Signed)
[place] for Warfarin Indication: a fib  Allergies  Allergen Reactions  . Naproxen     Other reaction(s): Other (See Comments) GI upset  . Sucralfate     Other reaction(s): Other (See Comments) Abdominal bloating  . Prednisone Anxiety    Shakes    Patient Measurements: Height: 5\' 3"  (160 cm) Weight: 56.2 kg (123 lb 12.8 oz) IBW/kg (Calculated) : 52.4  Vital Signs: Temp: 98.9 F (37.2 C) (05/25 0734) Temp Source: Oral (05/25 0734) BP: 155/85 (05/25 0734) Pulse Rate: 75 (05/25 0734)  Labs: Recent Labs    01/22/20 1209 01/22/20 1805 01/23/20 0110 01/23/20 0327 01/24/20 0454 01/25/20 0410  HGB  --   --  11.4*  --  12.8  --   HCT  --   --  34.0*  --  38.8  --   PLT  --   --  219  --  217  --   LABPROT   < >  --  26.2*  --  24.7* 25.9*  INR   < >  --  2.5*  --  2.3* 2.5*  CREATININE  --   --  1.29*  --  1.04* 1.15*  TROPONINIHS  --  130* 195* 186*  --   --    < > = values in this interval not displayed.    Estimated Creatinine Clearance: 30.7 mL/min (A) (by C-G formula based on SCr of 1.15 mg/dL (H)).   Medical History: Past Medical History:  Diagnosis Date  . Anemia   . Anxiety   . Arthritis   . Breast cancer ([place]) 2011   rt- radiation  . Cancer [facility]) 2011   rt breast  . Depression   . Dysrhythmia    CHB s/p PPM, afib history  . GERD (gastroesophageal reflux disease)   . Heart murmur   . Hypertension   . Personal history of radiation therapy   . Presence of permanent cardiac pacemaker     Medications:  Warfarin pta dosing 5mg  M-F and 7.5mg  S/Sun. Last dose taken pta 5/21 @ 1700 (5mg )  Assessment: Pharmacy has been consulted to continue warfarin therapy for a fib.   INR Warfarin dose 05/22  2.9 -- 05/23  2.5 7.5mg  05/24  2.3 5mg  05/25   2.5       5mg   Goal of Therapy:  INR 2-3 Monitor platelets by anticoagulation protocol: Yes   Plan:  Will continue with patient's home dose of warfarin 5 mg  tonight.  Pharmacy will continue to follow and adjust dose per INR.  Oswald Hillock, PharmD Clinical Pharmacist 01/25/2020 7:55 AM

## 2020-01-25 NOTE — Progress Notes (Signed)
Discharge report called to [facility]/ verbalized an understanding/ iv and tele removed/ EMS called to transport

## 2020-01-25 NOTE — TOC Transition Note (Signed)
Transition of Care [facility]) - CM/SW Discharge Note   Patient Details  Name: TISHAUNA NEALON MRN: [id number] Date of Birth: December 22, 1933  Transition of Care [facility]) CM/SW Contact:  Victorino Dike, RN Phone Number: 01/25/2020, 9:10 AM   Clinical Narrative:        Final next level of care: Assisted Living(with Hospice Service) Barriers to Discharge: Equipment Delay   Patient Goals and CMS Choice Patient states their goals for this hospitalization and ongoing recovery are:: Patient is not oriented. CMS Medicare.gov Compare Post Acute Care list provided to:: Patient Represenative (must comment)(Patient's daughter-in-law, Elon Jester.) Choice offered to / list presented to : Adult Children(Spoke w/ daughter-in-law [name] and she said to leave list at patient bedside.)  Discharge Placement                Patient to be transferred to facility by: [facility] Name of family member notified: [facility] Patient and family notified of of transfer: 01/25/20  Discharge Plan and Services In-house Referral: Clinical Social Work   Post Acute Care Choice: [place] Agency: [facility] Date [place]: 01/25/20 Time [place]: [phone] Representative spoke with at [place]: Earnestine Mealing, RN  Social Determinants of Health ([place]) Interventions     Readmission Risk Interventions No flowsheet data found.

## 2020-01-27 DIAGNOSIS — Z7901 Long term (current) use of anticoagulants: Secondary | ICD-10-CM | POA: Diagnosis not present

## 2020-01-27 DIAGNOSIS — I482 Chronic atrial fibrillation, unspecified: Secondary | ICD-10-CM | POA: Diagnosis not present

## 2020-02-01 DEATH — deceased

## 2020-11-22 IMAGING — DX DG CHEST 1V PORT
1 series · 1 of 1 positions shown · non-contrast
Comparison: November 09, 2015.

CLINICAL DATA: Tachypnea.

EXAM:
PORTABLE CHEST 1 VIEW

[chest ap]
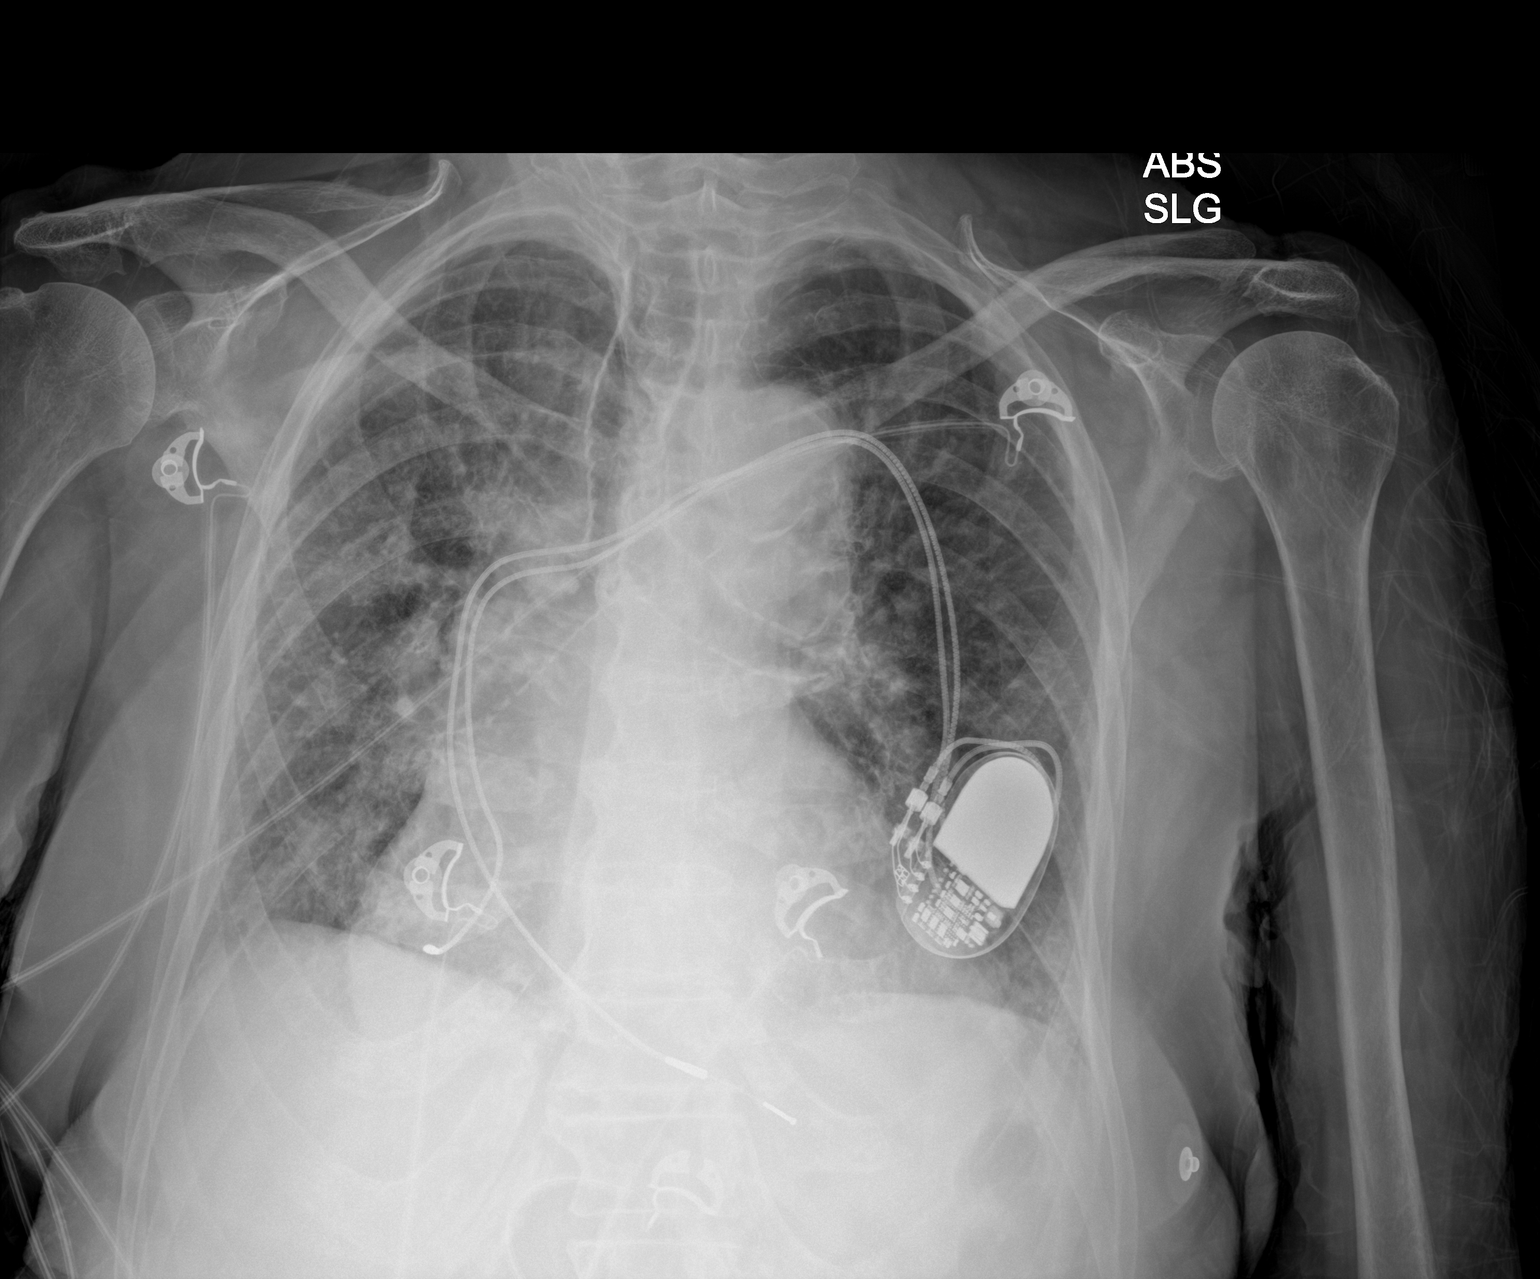

[1 of 1 positions shown; findings below may reference images not displayed]

FINDINGS: Stable cardiomediastinal silhouette. Left-sided pacemaker is
unchanged in position. No pneumothorax or pleural effusion is noted.
Mild multifocal opacities are noted in the right lung concerning for
pneumonia. Bony thorax is unremarkable.
IMPRESSION: Mild multifocal right lung opacities are noted concerning for
pneumonia.
